# Patient Record
Sex: Male | Born: 1985 | Race: White | Hispanic: No | Marital: Married | Smoking: Former smoker
Health system: Southern US, Community
[De-identification: ages and names within clinical notes are randomized; demographics above are authoritative.]

## PROBLEM LIST (undated history)

## (undated) DIAGNOSIS — K760 Fatty (change of) liver, not elsewhere classified: Secondary | ICD-10-CM

## (undated) DIAGNOSIS — F419 Anxiety disorder, unspecified: Secondary | ICD-10-CM

## (undated) DIAGNOSIS — M5030 Other cervical disc degeneration, unspecified cervical region: Principal | ICD-10-CM

## (undated) DIAGNOSIS — D1803 Hemangioma of intra-abdominal structures: Secondary | ICD-10-CM

## (undated) DIAGNOSIS — T7840XA Allergy, unspecified, initial encounter: Secondary | ICD-10-CM

## (undated) DIAGNOSIS — E785 Hyperlipidemia, unspecified: Secondary | ICD-10-CM

## (undated) DIAGNOSIS — G4730 Sleep apnea, unspecified: Secondary | ICD-10-CM

## (undated) DIAGNOSIS — K219 Gastro-esophageal reflux disease without esophagitis: Secondary | ICD-10-CM

## (undated) DIAGNOSIS — A048 Other specified bacterial intestinal infections: Secondary | ICD-10-CM

## (undated) DIAGNOSIS — G4733 Obstructive sleep apnea (adult) (pediatric): Secondary | ICD-10-CM

## (undated) DIAGNOSIS — I10 Essential (primary) hypertension: Secondary | ICD-10-CM

## (undated) HISTORY — DX: Hemangioma of intra-abdominal structures: D18.03

## (undated) HISTORY — DX: Other specified bacterial intestinal infections: A04.8

## (undated) HISTORY — DX: Gastro-esophageal reflux disease without esophagitis: K21.9

## (undated) HISTORY — DX: Anxiety disorder, unspecified: F41.9

## (undated) HISTORY — DX: Other cervical disc degeneration, unspecified cervical region: M50.30

## (undated) HISTORY — PX: WISDOM TOOTH EXTRACTION: SHX21

## (undated) HISTORY — DX: Fatty (change of) liver, not elsewhere classified: K76.0

## (undated) HISTORY — DX: Obstructive sleep apnea (adult) (pediatric): G47.33

## (undated) HISTORY — DX: Essential (primary) hypertension: I10

## (undated) HISTORY — DX: Sleep apnea, unspecified: G47.30

## (undated) HISTORY — DX: Hyperlipidemia, unspecified: E78.5

## (undated) HISTORY — DX: Allergy, unspecified, initial encounter: T78.40XA

---

## 2004-05-14 ENCOUNTER — Emergency Department (HOSPITAL_COMMUNITY): Admission: EM | Admit: 2004-05-14 | Discharge: 2004-05-14 | Payer: Self-pay

## 2008-01-29 ENCOUNTER — Ambulatory Visit (HOSPITAL_COMMUNITY): Admission: RE | Admit: 2008-01-29 | Discharge: 2008-01-29 | Payer: Self-pay | Admitting: Otolaryngology

## 2010-12-04 ENCOUNTER — Encounter: Payer: Self-pay | Admitting: Otolaryngology

## 2011-04-18 ENCOUNTER — Encounter: Payer: Self-pay | Admitting: Gastroenterology

## 2011-05-30 ENCOUNTER — Ambulatory Visit (INDEPENDENT_AMBULATORY_CARE_PROVIDER_SITE_OTHER): Payer: Managed Care, Other (non HMO) | Admitting: Gastroenterology

## 2011-05-30 ENCOUNTER — Encounter: Payer: Self-pay | Admitting: Gastroenterology

## 2011-05-30 DIAGNOSIS — R1310 Dysphagia, unspecified: Secondary | ICD-10-CM

## 2011-05-30 DIAGNOSIS — K219 Gastro-esophageal reflux disease without esophagitis: Secondary | ICD-10-CM

## 2011-05-30 MED ORDER — DEXLANSOPRAZOLE 60 MG PO CPDR: 60.0000 mg | DELAYED_RELEASE_CAPSULE | Freq: Every day | ORAL | Status: DC

## 2011-05-30 NOTE — Patient Instructions (Signed)
Today you watched the gerd movie in office.  Take Dexilant 60mg  once a day 30 min before breakfast. Samples given and rx sent to your pharmacy.  Your procedure has been scheduled for 06/12/2011   Acid Reflux (GERD) Acid reflux is also called gastroesophageal reflux disease (GERD). Your stomach makes acid to help digest food. Acid reflux happens when acid from your stomach goes into the tube between your mouth and stomach (esophagus). Your stomach is protected from the acid, but this tube is not. When acid gets into the tube, it may cause a burning feeling in the chest (heartburn). Besides heartburn, other health problems can happen if the acid keeps going into the tube. Some causes of acid reflux include:  Being overweight.   Smoking.   Drinking alcohol.   Eating large meals.   Eating meals and then going to bed right away.   Eating certain foods.   Increased stomach acid production.  HOME CARE  Take all medicine as told by your doctor.   You may need to:   Lose weight.   Avoid alcohol.   Quit smoking.   Do not eat big meals. It is better to eat smaller meals throughout the day.   Do not eat a meal and then nap or go to bed.   Sleep with your head higher than your stomach.   Avoid foods that bother you.   You may need more tests, or you may need to see a special doctor.  GET HELP RIGHT AWAY IF:  You have chest pain that is different than before.   You have pain that goes to your arms, jaw, or between your shoulder blades.   You throw up (vomit) blood, dark brown liquid, or your throw up looks like coffee grounds.   You have trouble swallowing.   You have trouble breathing or cannot stop coughing.   You feel dizzy or pass out.   Your skin is cool, wet, and pale.   Your medicine is not helping.  MAKE SURE YOU:   Understand these instructions.   Will watch your condition.   Will get help right away if you are not doing well or get worse.  Document  Released: 04/17/2008 Document Re-Released: 01/24/2010 [employer] Patient Information 2011 [place], [place]., please follow the seperate instructions.

## 2011-05-30 NOTE — Progress Notes (Signed)
History of Present Illness:  This is a very nice 25 year old white male referred through the courtesy of Dr. Lynnea Ferrier for evaluation of several years of increasing acid reflux symptoms with burning substernal chest pain and regurgitation without dysphagia. He apparently has been overriding of PPIs with minimal response. He has not had previous endoscopy or barium studies. His father suffers from chronic acid reflux has had previous fundoplication. The patient does excessive bending and lifting at his job, and is concerned about his regurgitation problems. Recent internal medicine evaluation has shown normal CBC a metabolic profile.  He follows a regular diet and denies new food intolerances. He does not smoke and uses ethanol socially. He recently was treated for H. pylori infection without symptomatic improvement. No history of lower gastrointestinal or hepatobiliary or general medical problems.  I have reviewed this patient's present history, medical and surgical past history, allergies and medications.     ROS: The remainder of the 10 point ROS is negative.. he specifically denies Raynaud's phenomenon, use of NSAIDs or other anti-inflammatories. No history of previous hepatitis or pancreatitis. He denies current cardiovascular, pulmonary, neurological or genitourinary or rheumatic problems.     Physical Exam: General well developed well nourished patient in no acute distress, appearing his stated age Eyes PERRLA, no icterus, fundoscopic exam per opthamologist Skin no lesions noted Neck supple, no adenopathy, no thyroid enlargement, no tenderness Chest clear to percussion and auscultation Heart no significant murmurs, gallops or rubs noted Abdomen no hepatosplenomegaly masses or tenderness, BS normal.  Extremities no acute joint lesions, edema, phlebitis or evidence of cellulitis. Neurologic patient oriented x 3, cranial nerves intact, no focal neurologic deficits noted. Psychological  mental status normal and normal affect.  Assessment and plan: Chronic GERD and a young patient who is physically active. He has not used PPIs were regular basis as suggested. I have reviewed and acid reflux regime with him, have renewed his Dexilant 60 mg every morning, and have scheduled her for endoscopic exam. This patient may be a good candidate for fundoplication surgery because of these factors. He saw our patient education video on acid reflux in his management. I suspect he does have a prominent hiatal hernia-rule out Barrett's mucosa.  Encounter Diagnosis  Name Primary?  Marland Kitchen Dysphagia Yes

## 2011-06-12 ENCOUNTER — Ambulatory Visit (AMBULATORY_SURGERY_CENTER): Payer: Managed Care, Other (non HMO) | Admitting: Gastroenterology

## 2011-06-12 ENCOUNTER — Encounter: Payer: Self-pay | Admitting: Gastroenterology

## 2011-06-12 DIAGNOSIS — R1310 Dysphagia, unspecified: Secondary | ICD-10-CM | POA: Insufficient documentation

## 2011-06-12 DIAGNOSIS — K219 Gastro-esophageal reflux disease without esophagitis: Secondary | ICD-10-CM

## 2011-06-12 MED ORDER — SODIUM CHLORIDE 0.9 % IV SOLN: 500.0000 mL | INTRAVENOUS | Status: DC

## 2011-06-12 NOTE — Patient Instructions (Addendum)
Please follow discharge instructions given today. Continue medications daily,samples given dexilant 1 tablet daily 30 minutes before breakfast. Next few days call office [phone] to make office visit to see Dr.Patterson in  One Month. Call us with any questions or concerns.   Gastroesophageal Reflux Disease (GERD) Your caregiver has diagnosed your chest discomfort as caused by gastroesophageal reflux disease (GERD). GERD is caused by a reflux of acid from your stomach into the digestive tube between your mouth and stomach (esophagus). Acid in contact with the esophagus causes soreness (inflammation) resulting in heartburn or chest pain. It may cause small holes in the lining of the esophagus (ulcers). CAUSES  Increased body weight puts pressure on the stomach, making acid rise.   Smoking increases acid production.   Alcohol decreases pressure on the valve between the stomach and esophagus (lower esophageal sphincter), allowing acid from the stomach into the esophagus.   Late evening meals and a full stomach increase pressure and acid production.   Lower esophageal sphincter is malformed.   Sometimes, no reason is found.  HOME CARE INSTRUCTIONS  Change the factors that you can control. Weight, smoking, or alcohol changes may be difficult to change on your own. Your caregiver can provide guidance and medical therapy.   Raising the head of your bed may help you to sleep.   Over-the-counter medicines will decrease acid production. Your caregiver can also prescribe medicines for this. Only take over-the-counter or prescription medicines for pain, discomfort, or fever as directed by your caregiver.   1/2 to 1 teaspoon of an antacid taken every hour while awake, with meals, and at bedtime, can neutralize acid.   DO NOT take aspirin, ibuprofen, or other nonsteroidal anti-inflammatory drugs (NSAIDs).  SEEK IMMEDIATE MEDICAL CARE IF:  The pain changes in location (radiates into arms, neck, jaw,  teeth, or back), intensity, or duration.   You start feeling sick to your stomach (nauseous), start throwing up (vomiting), or sweating (diaphoresis).   You develop left arm or jaw pain.   You develop pain going into your back, shortness of breath, or you pass out.   There is vomiting of fluid that is green, yellow, or looks like coffee grounds or blood.  These symptoms could signal other problems, such as heart disease. MAKE SURE YOU:  Understand these instructions.   Will watch your condition.   Will get help right away if you are not doing well or get worse.  Document Released: 08/09/2005 Document Re-Released: 01/24/2010 [facility] Patient Information 2011 [place], [place].

## 2011-06-13 ENCOUNTER — Telehealth: Payer: Self-pay | Admitting: *Deleted

## 2011-06-13 ENCOUNTER — Telehealth: Payer: Self-pay

## 2011-06-13 NOTE — Telephone Encounter (Signed)
No ID on answering machine.  No message left. 

## 2011-06-13 NOTE — Telephone Encounter (Signed)
lmom for pt to call back. Per his discharge notes, scheduled pt to see Dr Jarold Motto on 07/04/11 at 0845am.

## 2011-06-15 ENCOUNTER — Encounter: Payer: Self-pay | Admitting: *Deleted

## 2011-06-15 NOTE — Telephone Encounter (Signed)
Pt did not call back; letter sent with appointment time.

## 2011-07-04 ENCOUNTER — Ambulatory Visit: Payer: Managed Care, Other (non HMO) | Admitting: Gastroenterology

## 2011-07-11 ENCOUNTER — Encounter: Payer: Self-pay | Admitting: Gastroenterology

## 2011-07-11 ENCOUNTER — Ambulatory Visit (INDEPENDENT_AMBULATORY_CARE_PROVIDER_SITE_OTHER): Payer: Managed Care, Other (non HMO) | Admitting: Gastroenterology

## 2011-07-11 VITALS — BP 100/74 | HR 80 | Ht 69.0 in | Wt 148.4 lb

## 2011-07-11 DIAGNOSIS — K219 Gastro-esophageal reflux disease without esophagitis: Secondary | ICD-10-CM | POA: Insufficient documentation

## 2011-07-11 NOTE — Patient Instructions (Signed)
Take your Dexilant for another couple months and then try to stop it.

## 2011-07-11 NOTE — Progress Notes (Signed)
History of Present Illness: This is a [age]-year-old Caucasian male with one year of GERD now relieved by daily Dexilant 60 mg every morning. Recent endoscopy was entirely unremarkable. He denies a current gastrointestinal or general medical problems.    Current Medications, Allergies, Past Medical History, Past Surgical History, Family History and Social History were reviewed in [employer] record.   Assessment and plan: I have reviewed her reflux regime with him, and actually is much better with his symptomatology since discontinuing carbonated beverages. I've asked him to take Dexilant 60 mg every morning for another 8 weeks, then try to discontinue this medication and to let us know if he has a relapse of his GERD. We will see him otherwise we'll when necessary basis as needed. Encounter Diagnosis  Name Primary?  . GERD (gastroesophageal reflux disease) Yes

## 2011-08-01 ENCOUNTER — Ambulatory Visit: Payer: Managed Care, Other (non HMO) | Admitting: Gastroenterology

## 2011-08-15 ENCOUNTER — Ambulatory Visit: Payer: Managed Care, Other (non HMO) | Admitting: Gastroenterology

## 2012-06-07 ENCOUNTER — Other Ambulatory Visit: Payer: Self-pay | Admitting: Gastroenterology

## 2013-02-15 ENCOUNTER — Encounter: Payer: Self-pay | Admitting: Family Medicine

## 2013-02-15 DIAGNOSIS — J309 Allergic rhinitis, unspecified: Secondary | ICD-10-CM | POA: Insufficient documentation

## 2013-02-20 ENCOUNTER — Encounter: Payer: Self-pay | Admitting: Family Medicine

## 2013-03-20 ENCOUNTER — Encounter: Payer: Self-pay | Admitting: Family Medicine

## 2013-03-20 ENCOUNTER — Ambulatory Visit (INDEPENDENT_AMBULATORY_CARE_PROVIDER_SITE_OTHER): Payer: Managed Care, Other (non HMO) | Admitting: Family Medicine

## 2013-03-20 VITALS — BP 100/70 | HR 58 | Temp 98.2°F | Resp 14 | Ht 69.0 in | Wt 160.0 lb

## 2013-03-20 DIAGNOSIS — B078 Other viral warts: Secondary | ICD-10-CM

## 2013-03-20 DIAGNOSIS — A630 Anogenital (venereal) warts: Secondary | ICD-10-CM | POA: Insufficient documentation

## 2013-03-20 DIAGNOSIS — Z0000 Encounter for general adult medical examination without abnormal findings: Secondary | ICD-10-CM

## 2013-03-20 DIAGNOSIS — B079 Viral wart, unspecified: Secondary | ICD-10-CM

## 2013-03-20 NOTE — Progress Notes (Signed)
Subjective:    Patient ID: Kent Hernandez, male    DOB: Jul 04, 1986, 27 y.o.   MRN: [id number]  HPI Patient is here for complete physical exam. He's continued to have frequent heartburn. This occurs possibly 2-3 days a week. Haldol and medications seemed to help his He feels is too expensive. He denies any melena or hematochezia. He is also complaining of genital warts. They're difficult to determine if her general large versus small skin tags because they're very small 1-2 mm in size. We have previously treated similar small papules with cryotherapy. He is requesting that we do that today.  Past Medical History  Diagnosis Date  . H. pylori infection   . Esophageal reflux   . Allergic rhinitis   . Genital warts    Current Outpatient Prescriptions on File Prior to Visit  Medication Sig Dispense Refill  . cetirizine (ZYRTEC) 10 MG tablet Take 10 mg by mouth 2 (two) times daily.         No current facility-administered medications on file prior to visit.   No Known Allergies History   Social History  . Marital Status: Single    Spouse Name: N/A    Number of Children: N/A  . Years of Education: N/A   Occupational History  . Store Event organiser   Social History Main Topics  . Smoking status: Former Smoker    Types: Cigarettes  . Smokeless tobacco: Former Neurosurgeon    Quit date: 02/12/2011     Comment: Light use  . Alcohol Use: Yes  . Drug Use: No  . Sexually Active: Not on file   Other Topics Concern  . Not on file   Social History Narrative  . No narrative on file   Family History  Problem Relation Age of Onset  . Diabetes Maternal Uncle   . Diabetes Paternal Grandfather   . Hypertension Father   . Kidney disease Father   . Cancer Maternal Grandmother       Review of Systems  All other systems reviewed and are negative.       Objective:   Physical Exam  Constitutional: He is oriented to person, place, and time. He appears well-developed and  well-nourished.  HENT:  Head: Normocephalic and atraumatic.  Right Ear: External ear normal.  Left Ear: External ear normal.  Nose: Nose normal.  Mouth/Throat: Oropharynx is clear and moist. No oropharyngeal exudate.  Eyes: Conjunctivae and EOM are normal. Pupils are equal, round, and reactive to light. Right eye exhibits no discharge. Left eye exhibits no discharge. No scleral icterus.  Neck: Normal range of motion. Neck supple. No JVD present. No thyromegaly present.  Cardiovascular: Normal rate, regular rhythm and intact distal pulses.  Exam reveals no friction rub.   No murmur heard. Pulmonary/Chest: Effort normal and breath sounds normal. No respiratory distress. He has no wheezes. He has no rales. He exhibits no tenderness.  Abdominal: Soft. Bowel sounds are normal. He exhibits no distension and no mass. There is no tenderness. There is no rebound and no guarding.  Genitourinary: Penis normal. No penile tenderness.  Musculoskeletal: Normal range of motion. He exhibits no edema and no tenderness.  Lymphadenopathy:    He has no cervical adenopathy.  Neurological: He is alert and oriented to person, place, and time. He has normal reflexes. He displays normal reflexes. No cranial nerve deficit. He exhibits normal muscle tone. Coordination normal.  Skin: Skin is warm and dry. Rash noted. No erythema. No pallor.  Psychiatric:  He has a normal mood and affect. His behavior is normal. Judgment and thought content normal.   patient has for, 1-2 mm flesh-colored papules around his penis. They appear to be small verruca although I cannot exclude skin tags.        Assessment & Plan:  1. Routine general medical examination at a health care facility Physical is normal. I recommended a tetanus shot. The patient declined. He should return fasting for some baseline screening labs. 2 recommend he start taking Zantac 150 mg by mouth daily for heartburn. - Basic Metabolic Panel; Future - CBC with  Differential; Future - Hepatic Function Panel; Future - Lipid Panel; Future  2. Verrucae vulgaris There were 4 lesions which I believe her genital warts versus small skin tags although is difficult to delineate today. Liquid nitrogen cryotherapy was applied to each lesion for a total of 20 seconds. Wound care was discussed.

## 2013-05-20 ENCOUNTER — Telehealth: Payer: Self-pay | Admitting: Family Medicine

## 2013-05-20 ENCOUNTER — Other Ambulatory Visit: Payer: Self-pay | Admitting: Family Medicine

## 2013-05-20 MED ORDER — DEXLANSOPRAZOLE 60 MG PO CPDR: 60.0000 mg | DELAYED_RELEASE_CAPSULE | Freq: Every day | ORAL | Status: DC

## 2013-05-20 NOTE — Telephone Encounter (Signed)
Pt aware.

## 2013-05-20 NOTE — Telephone Encounter (Signed)
Ordered in epic

## 2013-06-16 ENCOUNTER — Ambulatory Visit (INDEPENDENT_AMBULATORY_CARE_PROVIDER_SITE_OTHER): Payer: Managed Care, Other (non HMO) | Admitting: Physician Assistant

## 2013-06-16 ENCOUNTER — Encounter: Payer: Self-pay | Admitting: Physician Assistant

## 2013-06-16 VITALS — BP 124/90 | HR 68 | Temp 98.3°F | Resp 18 | Ht 68.75 in | Wt 165.0 lb

## 2013-06-16 DIAGNOSIS — H9209 Otalgia, unspecified ear: Secondary | ICD-10-CM

## 2013-06-16 DIAGNOSIS — M542 Cervicalgia: Secondary | ICD-10-CM

## 2013-06-16 DIAGNOSIS — H9202 Otalgia, left ear: Secondary | ICD-10-CM

## 2013-06-16 DIAGNOSIS — Z0000 Encounter for general adult medical examination without abnormal findings: Secondary | ICD-10-CM

## 2013-06-16 LAB — CBC WITH DIFFERENTIAL/PLATELET
Eosinophils Absolute: 0.2 10*3/uL (ref 0.0–0.7)
Eosinophils Relative: 4 % (ref 0–5)
Lymphs Abs: 1.8 10*3/uL (ref 0.7–4.0)
MCH: 30.5 pg (ref 26.0–34.0)
MCV: 87.3 fL (ref 78.0–100.0)
Monocytes Relative: 9 % (ref 3–12)
Platelets: 297 10*3/uL (ref 150–400)
RBC: 5.35 MIL/uL (ref 4.22–5.81)

## 2013-06-16 LAB — HEPATIC FUNCTION PANEL
ALT: 37 U/L (ref 0–53)
AST: 27 U/L (ref 0–37)
Albumin: 4.5 g/dL (ref 3.5–5.2)
Alkaline Phosphatase: 77 U/L (ref 39–117)
Total Protein: 6.8 g/dL (ref 6.0–8.3)

## 2013-06-16 LAB — BASIC METABOLIC PANEL
BUN: 11 mg/dL (ref 6–23)
Chloride: 103 mEq/L (ref 96–112)
Potassium: 4.5 mEq/L (ref 3.5–5.3)

## 2013-06-16 MED ORDER — MELOXICAM 7.5 MG PO TABS: 7.5000 mg | ORAL_TABLET | Freq: Every day | ORAL | Status: DC

## 2013-06-16 MED ORDER — METAXALONE 800 MG PO TABS: 800.0000 mg | ORAL_TABLET | Freq: Three times a day (TID) | ORAL | Status: DC

## 2013-06-16 NOTE — Progress Notes (Signed)
Patient ID: Kent Hernandez MRN: [id number], DOB: 12/02/1985, 27 y.o. Date of Encounter: 06/16/2013, 1:30 PM    Chief Complaint:  Chief Complaint  Patient presents with  . neck pain since yesterday,  water in left ear    had CPE in May  never returned for fasting labs want sto do today     HPI: 27 y.o. year old white male here with c/o:  1- Neck Pain: He was at beach- yesterday was sitting in a chair and turned his head-suddenly developed pain that shot down his right neck. Since, can not turn head, has pain there. He had done no overhead activity, lifting, etc. No trauma, injury. Has no idea what brouth this about-thinks he just turned his head jus tth e"right way" to cause a "catch." He has no h/o pain in neck. 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   2- "Got water in his left ear and ringing in the ear since."   Home Meds: See attached medication section for any medications that were entered at today's visit. The computer does not put those onto this list.The following list is a list of meds entered prior to today's visit.   Current Outpatient Prescriptions on File Prior to Visit  Medication Sig Dispense Refill  . cetirizine (ZYRTEC) 10 MG tablet Take 10 mg by mouth 2 (two) times daily.        . ciclesonide (OMNARIS) 50 MCG/ACT nasal spray Place 2 sprays into both nostrils daily.      Marland Kitchen dexlansoprazole (DEXILANT) 60 MG capsule Take 1 capsule (60 mg total) by mouth daily.  30 capsule  2   No current facility-administered medications on file prior to visit.    Allergies: No Known Allergies    Review of Systems: See HPI for pertinent ROS. All other ROS negative.    Physical Exam: Blood pressure 124/90, pulse 68, temperature 98.3 F (36.8 C), temperature source Oral, resp. rate 18, height 5' 8.75" (1.746 m), weight 165 lb (74.844 kg)., Body mass index is 24.55 kg/(m^2). General: WNWD WM. Appears in no acute distress. HEENT: Normocephalic, atraumatic, eyes without  discharge, sclera non-icteric, nares are without discharge. Left Ear Canal obstructed with cerumen. Right Ear canal, TM normal.  Oral cavity moist, posterior pharynx without exudate, erythema, peritonsillar abscess, or post nasal drip.  Neck: Supple. No thyromegaly. No lymphadenopathy. He can only rotate to Right 15 degrees. He can only tilt head to Left and Right 15 degrees. Cna rotate to Left 60 degrees.  Right Trapezius extremely painful and tight with palpation.  Lungs: Clear bilaterally to auscultation without wheezes, rales, or rhonchi. Breathing is unlabored. Heart: Regular rhythm. No murmurs, rubs, or gallops. Msk:  Strength and tone normal for age. Extremities/Skin: Warm and dry.  Neuro: Alert and oriented X 3. Moves all extremities spontaneously. Gait is normal. CNII-XII grossly in tact. Psych:  Responds to questions appropriately with a normal affect.     ASSESSMENT AND PLAN:  27 y.o. year old male with  1. Neck pain He reports he is OOW today. Plans to return to work tomorrow.  Informed to take Mobic with food. Informed Skelaxin may cause drowsiness. If so, only take HS. Apply heat. Stretch gently. Shoulder ROM also . - metaxalone (SKELAXIN) 800 MG tablet; Take 1 tablet (800 mg total) by mouth 3 (three) times daily.  Dispense: 30 tablet; Refill: 1 - meloxicam (MOBIC) 7.5 MG tablet; Take 1 tablet (7.5 mg total) by mouth daily.  Dispense: 30 tablet;  Refill: 0  2. Left ear pain-Sec to cerumen impaction.  Irrigate now.   3. Routine general medical examination at a health care facility Pt had CPE 5/14. He never returned fasting until now-wants to do fasting labs-he had this drawn before I even enetered room today. CPE NOT done today. (Level 3 OV today) - Basic Metabolic Panel - CBC with Differential - Hepatic Function Panel - Lipid Panel   Signed, [address] [place], [place], [facility] 06/16/2013 1:30 PM

## 2013-06-18 ENCOUNTER — Encounter: Payer: Self-pay | Admitting: Family Medicine

## 2013-07-30 ENCOUNTER — Ambulatory Visit: Payer: Self-pay | Admitting: Family Medicine

## 2013-08-04 ENCOUNTER — Encounter: Payer: Self-pay | Admitting: Family Medicine

## 2013-08-04 ENCOUNTER — Ambulatory Visit (INDEPENDENT_AMBULATORY_CARE_PROVIDER_SITE_OTHER): Payer: Managed Care, Other (non HMO) | Admitting: Family Medicine

## 2013-08-04 VITALS — BP 104/70 | HR 82 | Temp 97.7°F | Resp 18 | Wt 163.0 lb

## 2013-08-04 DIAGNOSIS — L989 Disorder of the skin and subcutaneous tissue, unspecified: Secondary | ICD-10-CM

## 2013-08-04 MED ORDER — DEXLANSOPRAZOLE 60 MG PO CPDR: 60.0000 mg | DELAYED_RELEASE_CAPSULE | Freq: Every day | ORAL | Status: DC

## 2013-08-04 NOTE — Patient Instructions (Signed)
Use topical antibiotic ointment twice a day  Call if infection worsens  Or any other concerns

## 2013-08-04 NOTE — Assessment & Plan Note (Signed)
Small irritated papule in his umbilicus. I see no evidence of cellulitis or abscess. This does not characteristic of a polyp in his only been present a couple weeks. Looks like there may of been some irritation to the skin initially before the papule. We will apply Triple Antibiotic ointment and see how this does. If he has any worsening symptoms or signs of cellulitis or abscess will start him on oral antibiotics. If the lesion does not improve we'll consider resection

## 2013-08-04 NOTE — Progress Notes (Signed)
  Subjective:    Patient ID: Kent Hernandez, male    DOB: 27-Jan-1986, 27 y.o.   MRN: [id number]  HPI  Pt here with small bump inside naval for 2 weeks. Denies any drainage, but tender to touch. Denies abdominal pain, N/V, diarrhea or fever. No previous lesions.   Review of Systems - per above  GEN- denies fatigue, fever, weight loss,weakness, recent illnesse ABD- denies N/V, change in stools, abd pain        Objective:   Physical Exam GEN-NAD,alert and oriented x 3 ABD- soft,NT,ND, no umbilical hernia Skin- umbilicus small erythematous papule mild TTP, no discharge, no fluctuance, no cellulitic changes       Assessment & Plan:

## 2013-11-13 DIAGNOSIS — M5030 Other cervical disc degeneration, unspecified cervical region: Secondary | ICD-10-CM

## 2013-11-13 HISTORY — DX: Other cervical disc degeneration, unspecified cervical region: M50.30

## 2014-04-01 ENCOUNTER — Other Ambulatory Visit: Payer: Self-pay | Admitting: Family Medicine

## 2014-04-01 NOTE — Telephone Encounter (Signed)
Refill appropriate and filled per protocol. 

## 2014-05-05 ENCOUNTER — Ambulatory Visit: Payer: Managed Care, Other (non HMO) | Admitting: Family Medicine

## 2014-05-14 ENCOUNTER — Ambulatory Visit: Payer: Self-pay | Admitting: Family Medicine

## 2014-05-21 ENCOUNTER — Ambulatory Visit (INDEPENDENT_AMBULATORY_CARE_PROVIDER_SITE_OTHER): Payer: Managed Care, Other (non HMO) | Admitting: Family Medicine

## 2014-05-21 ENCOUNTER — Encounter: Payer: Self-pay | Admitting: Family Medicine

## 2014-05-21 VITALS — BP 110/90 | HR 76 | Temp 97.7°F | Resp 18 | Ht 69.0 in | Wt 164.0 lb

## 2014-05-21 DIAGNOSIS — G5603 Carpal tunnel syndrome, bilateral upper limbs: Secondary | ICD-10-CM

## 2014-05-21 DIAGNOSIS — G5600 Carpal tunnel syndrome, unspecified upper limb: Secondary | ICD-10-CM

## 2014-05-21 DIAGNOSIS — M542 Cervicalgia: Secondary | ICD-10-CM

## 2014-05-21 NOTE — Progress Notes (Signed)
   Subjective:    Patient ID: Kent Hernandez, male    DOB: June 26, 1986, 28 y.o.   MRN: [id number]  HPI Patient presents with 3-4 months of worsening numbness in both hands  it is worse depending upon the position his hands are in. Symptoms tend to happen at night, while driving a car, talking on the telephone, or typing on his computer.  However the symptoms can occur unprovoked.  He is also reporting weakness in his hands. He complains of pain in his neck. Pain is worse whenever he rotates his head to left. He denies any injury to the neck. Past Medical History  Diagnosis Date  . H. pylori infection   . Esophageal reflux   . Allergic rhinitis   . Genital warts    Current Outpatient Prescriptions on File Prior to Visit  Medication Sig Dispense Refill  . cetirizine (ZYRTEC) 10 MG tablet Take 10 mg by mouth 2 (two) times daily.        . ciclesonide (OMNARIS) 50 MCG/ACT nasal spray Place 2 sprays into both nostrils daily.      Marland Kitchen DEXILANT 60 MG capsule TAKE ONE (1) CAPSULE EACH DAY  30 capsule  3   No current facility-administered medications on file prior to visit.   No Known Allergies History   Social History  . Marital Status: Single    Spouse Name: N/A    Number of Children: N/A  . Years of Education: N/A   Occupational History  . Store Production assistant, radio   Social History Main Topics  . Smoking status: Former Smoker    Types: Cigarettes  . Smokeless tobacco: Former Systems developer    Quit date: 02/12/2011     Comment: Light use  . Alcohol Use: Yes  . Drug Use: No  . Sexual Activity: Not on file   Other Topics Concern  . Not on file   Social History Narrative  . No narrative on file       Review of Systems  All other systems reviewed and are negative.      Objective:   Physical Exam  Vitals reviewed. Constitutional: He is oriented to person, place, and time.  Cardiovascular: Normal rate, regular rhythm and normal heart sounds.   No murmur heard. Pulmonary/Chest:  Effort normal and breath sounds normal. No respiratory distress. He has no wheezes. He has no rales.  Abdominal: Soft. Bowel sounds are normal.  Neurological: He is alert and oriented to person, place, and time. He has normal reflexes. He displays normal reflexes. No cranial nerve deficit. He exhibits normal muscle tone. Coordination normal.   patient has a positive Tinel sign and positive Phalen sign at both wrists although left is greater than right.        Assessment & Plan:  1. Neck pain Began by obtaining cervical spine x-ray. Await results of the x-ray before proceeding further the - DG Cervical Spine Complete; Future  2. Bilateral carpal tunnel syndrome Symptoms are consistent with carpal tunnel syndrome. Ultane or conduction test and depending upon the severity may recommend a consultation to orthopedic surgery. - Nerve conduction test; Future

## 2014-06-18 ENCOUNTER — Ambulatory Visit
Admission: RE | Admit: 2014-06-18 | Discharge: 2014-06-18 | Disposition: A | Discharge status: Home / Self Care | Payer: Managed Care, Other (non HMO) | Source: Ambulatory Visit | Attending: Family Medicine | Admitting: Family Medicine

## 2014-06-18 DIAGNOSIS — M542 Cervicalgia: Secondary | ICD-10-CM

## 2014-06-19 ENCOUNTER — Other Ambulatory Visit: Payer: Self-pay | Admitting: Family Medicine

## 2014-06-19 DIAGNOSIS — G542 Cervical root disorders, not elsewhere classified: Secondary | ICD-10-CM

## 2014-06-25 ENCOUNTER — Ambulatory Visit (HOSPITAL_COMMUNITY): Payer: Managed Care, Other (non HMO)

## 2014-07-01 ENCOUNTER — Ambulatory Visit (HOSPITAL_COMMUNITY)
Admission: RE | Admit: 2014-07-01 | Discharge: 2014-07-01 | Disposition: A | Discharge status: Home / Self Care | Payer: Managed Care, Other (non HMO) | Source: Ambulatory Visit | Attending: Family Medicine | Admitting: Family Medicine

## 2014-07-01 ENCOUNTER — Ambulatory Visit (HOSPITAL_COMMUNITY): Payer: Managed Care, Other (non HMO)

## 2014-07-01 DIAGNOSIS — M5020 Other cervical disc displacement, unspecified cervical region: Secondary | ICD-10-CM | POA: Diagnosis not present

## 2014-07-01 DIAGNOSIS — M542 Cervicalgia: Secondary | ICD-10-CM | POA: Insufficient documentation

## 2014-07-01 DIAGNOSIS — M5030 Other cervical disc degeneration, unspecified cervical region: Secondary | ICD-10-CM | POA: Insufficient documentation

## 2014-07-01 DIAGNOSIS — G542 Cervical root disorders, not elsewhere classified: Secondary | ICD-10-CM

## 2014-07-06 ENCOUNTER — Telehealth: Payer: Self-pay | Admitting: Family Medicine

## 2014-07-06 MED ORDER — TRAMADOL HCL 50 MG PO TABS: 50.0000 mg | ORAL_TABLET | Freq: Three times a day (TID) | ORAL | Status: DC | PRN

## 2014-07-06 NOTE — Telephone Encounter (Signed)
Med called to pharm and pt aware 

## 2014-07-06 NOTE — Telephone Encounter (Signed)
Patient had mri last week and would like appt with dr Rita Ohara if possible the neurosurgeon  [phone]

## 2014-07-06 NOTE — Telephone Encounter (Signed)
Okay with tramadol 50 mg poq6 hrs prn pain. 30.

## 2014-07-06 NOTE — Telephone Encounter (Signed)
[phone] Patient is calling to see if he can get pain medication for his neck, he had mri last week

## 2014-07-07 ENCOUNTER — Other Ambulatory Visit: Payer: Self-pay | Admitting: Family Medicine

## 2014-07-07 DIAGNOSIS — IMO0002 Reserved for concepts with insufficient information to code with codable children: Secondary | ICD-10-CM

## 2014-07-07 DIAGNOSIS — M5030 Other cervical disc degeneration, unspecified cervical region: Secondary | ICD-10-CM

## 2014-07-08 NOTE — Telephone Encounter (Signed)
Referral initiated to Dr. Sherwood Gambler

## 2014-08-10 ENCOUNTER — Telehealth: Payer: Self-pay | Admitting: Family Medicine

## 2014-08-10 MED ORDER — DEXLANSOPRAZOLE 60 MG PO CPDR: DELAYED_RELEASE_CAPSULE | ORAL | Status: DC

## 2014-08-10 NOTE — Telephone Encounter (Signed)
[phone]  Pt is needing a refill on DEXILANT 60 MG capsule

## 2014-08-10 NOTE — Telephone Encounter (Signed)
Med sent to pharm 

## 2015-08-27 ENCOUNTER — Telehealth: Payer: Self-pay | Admitting: Family Medicine

## 2015-08-27 NOTE — Telephone Encounter (Signed)
Pt is calling for a refill of his heart burn medication  [facility] [place] [phone]

## 2015-08-30 MED ORDER — DEXLANSOPRAZOLE 60 MG PO CPDR: DELAYED_RELEASE_CAPSULE | ORAL | Status: DC

## 2015-08-30 NOTE — Telephone Encounter (Signed)
Medication called/sent to requested pharmacy  

## 2015-09-21 ENCOUNTER — Other Ambulatory Visit: Payer: Managed Care, Other (non HMO)

## 2015-09-23 ENCOUNTER — Encounter: Payer: Managed Care, Other (non HMO) | Admitting: Family Medicine

## 2015-12-13 ENCOUNTER — Encounter: Payer: Self-pay | Admitting: Physician Assistant

## 2015-12-13 ENCOUNTER — Other Ambulatory Visit: Payer: Self-pay | Admitting: Physician Assistant

## 2015-12-13 ENCOUNTER — Ambulatory Visit (INDEPENDENT_AMBULATORY_CARE_PROVIDER_SITE_OTHER): Payer: Managed Care, Other (non HMO) | Admitting: Physician Assistant

## 2015-12-13 ENCOUNTER — Other Ambulatory Visit: Payer: Self-pay | Admitting: Family Medicine

## 2015-12-13 VITALS — BP 104/76 | HR 96 | Temp 98.8°F | Resp 18 | Wt 168.0 lb

## 2015-12-13 DIAGNOSIS — B9789 Other viral agents as the cause of diseases classified elsewhere: Secondary | ICD-10-CM

## 2015-12-13 DIAGNOSIS — R509 Fever, unspecified: Secondary | ICD-10-CM

## 2015-12-13 DIAGNOSIS — B349 Viral infection, unspecified: Secondary | ICD-10-CM | POA: Diagnosis not present

## 2015-12-13 DIAGNOSIS — J988 Other specified respiratory disorders: Secondary | ICD-10-CM

## 2015-12-13 LAB — INFLUENZA A AND B AG, IMMUNOASSAY
Influenza A Antigen: NOT DETECTED
Influenza B Antigen: NOT DETECTED

## 2015-12-13 MED ORDER — CICLESONIDE 50 MCG/ACT NA SUSP: 2.0000 | Freq: Every day | NASAL | Status: DC

## 2015-12-13 MED ORDER — DEXLANSOPRAZOLE 60 MG PO CPDR: DELAYED_RELEASE_CAPSULE | ORAL | Status: DC

## 2015-12-13 NOTE — Progress Notes (Signed)
Patient ID: Kent Hernandez MRN: [id number], DOB: July 12, 1986, 30 y.o. Date of Encounter: @DATE @  Chief Complaint:  Chief Complaint  Patient presents with  . sick x 1 day    fever, chills, harsh cough, congestion    HPI: 30 y.o. year old white male  presents with above.   Says generally he would not have come in this soon, but he has a 21 month old baby at home and wanted to make sure that he does not have the flu or something that could be treated and prevent from spreading to the baby. Has a baby boy and this is his first baby. Says that he just started getting sick yesterday. Yesterday developed a cough. Last night felt chills. He has been checking his temperature and gets between 97.2 - 99.2. Says he has had no drainage from the nose. Has just had cough. No significant sore throat.  Past Medical History  Diagnosis Date  . H. pylori infection   . Esophageal reflux   . Allergic rhinitis   . Genital warts      Home Meds: Outpatient Prescriptions Prior to Visit  Medication Sig Dispense Refill  . cetirizine (ZYRTEC) 10 MG tablet Take 10 mg by mouth 2 (two) times daily.      . ciclesonide (OMNARIS) 50 MCG/ACT nasal spray Place 2 sprays into both nostrils daily.    Marland Kitchen dexlansoprazole (DEXILANT) 60 MG capsule TAKE ONE (1) CAPSULE EACH DAY 30 capsule 11  . traMADol (ULTRAM) 50 MG tablet Take 1 tablet (50 mg total) by mouth every 8 (eight) hours as needed. 30 tablet 0   No facility-administered medications prior to visit.    Allergies: No Known Allergies  Social History   Social History  . Marital Status: Married    Spouse Name: N/A  . Number of Children: N/A  . Years of Education: N/A   Occupational History  . Store Production assistant, radio   Social History Main Topics  . Smoking status: Former Smoker    Types: Cigarettes  . Smokeless tobacco: Former Systems developer    Quit date: 02/12/2011     Comment: Light use  . Alcohol Use: Yes  . Drug Use: No  . Sexual Activity: Not on  file   Other Topics Concern  . Not on file   Social History Narrative    Family History  Problem Relation Age of Onset  . Diabetes Maternal Uncle   . Diabetes Paternal Grandfather   . Hypertension Father   . Kidney disease Father   . Cancer Maternal Grandmother      Review of Systems:  See HPI for pertinent ROS. All other ROS negative.    Physical Exam: Blood pressure 104/76, pulse 96, temperature 98.8 F (37.1 C), temperature source Oral, resp. rate 18, weight 168 lb (76.204 kg)., Body mass index is 24.8 kg/(m^2). General: WNWD WM. Appears in no acute distress. Head: Normocephalic, atraumatic, eyes without discharge, sclera non-icteric, nares are without discharge. Bilateral auditory canals clear, TM's are without perforation, pearly grey and translucent with reflective cone of light bilaterally. Oral cavity moist, posterior pharynx without exudate, erythema, peritonsillar abscess. No tenderness with percussion to frontal or maxillary sinuses bilaterally.  Neck: Supple. No thyromegaly. No lymphadenopathy. Lungs: Clear bilaterally to auscultation without wheezes, rales, or rhonchi. Breathing is unlabored. Heart: RRR with S1 S2. No murmurs, rubs, or gallops. Musculoskeletal:  Strength and tone normal for age. Extremities/Skin: Warm and dry. Neuro: Alert and oriented X 3. Moves all extremities  spontaneously. Gait is normal. CNII-XII grossly in tact. Psych:  Responds to questions appropriately with a normal affect.   Influenza test negative.   ASSESSMENT AND PLAN:  30 y.o. year old male with  1. Viral respiratory infection Reviewed that influenza test negative. Reviewed that this is likely a viral infection. Recommend use over-the-counter cough medications as needed for symptom relief. Follow up if fever increases or symptoms worsen significantly or persist greater than a week.  2. Chills with fever - Influenza a and b   Signed, [address] [place], [place], [facility] 12/13/2015 4:34  PM

## 2015-12-15 ENCOUNTER — Ambulatory Visit (INDEPENDENT_AMBULATORY_CARE_PROVIDER_SITE_OTHER): Payer: Managed Care, Other (non HMO) | Admitting: Physician Assistant

## 2015-12-15 ENCOUNTER — Encounter: Payer: Self-pay | Admitting: Physician Assistant

## 2015-12-15 VITALS — BP 106/60 | HR 108 | Temp 98.2°F | Resp 18 | Wt 166.0 lb

## 2015-12-15 DIAGNOSIS — J019 Acute sinusitis, unspecified: Secondary | ICD-10-CM | POA: Diagnosis not present

## 2015-12-15 MED ORDER — PREDNISONE 20 MG PO TABS: ORAL_TABLET | ORAL | Status: DC

## 2015-12-15 MED ORDER — AZITHROMYCIN 250 MG PO TABS: ORAL_TABLET | ORAL | Status: DC

## 2015-12-15 NOTE — Progress Notes (Signed)
Patient ID: Kent Hernandez MRN: [id number], DOB: 03-19-1986, 30 y.o. Date of Encounter: @DATE @  Chief Complaint:  Chief Complaint  Patient presents with  . sick x 3 days    fever, sinus infection    HPI: 30 y.o. year old white male  presents with above.   He just had office visit with me 12/13/15. Following is copied from that visit: Says generally he would not have come in this soon, but he has a 35 month old baby at home and wanted to make sure that he does not have the flu or something that could be treated and prevent from spreading to the baby. Has a baby boy and this is his first baby. Says that he just started getting sick yesterday. Yesterday developed a cough. Last night felt chills. He has been checking his temperature and gets between 97.2 - 99.2. Says he has had no drainage from the nose. Has just had cough. No significant sore throat. At that OV: Reviewed that influenza test negative. Reviewed that this is likely a viral infection. Recommend use over-the-counter cough medications as needed for symptom relief. Follow up if fever increases or symptoms worsen significantly or persist greater than a week.  TODAY: Today he states his symptoms have worsened significantly. Says is now having symptoms that he usually has with sinus infections. Head feels very stopped up with a lot of pressure. Is not getting much of anything out of his nose but says that is draining down his throat instead. His temperature has gone up to 101.1 at home. Says he does not feel like he has a lot of chest congestion but he does cough during the visit and it sounds very congested. Says that he is having no sore throat or earache.    Past Medical History  Diagnosis Date  . H. pylori infection   . Esophageal reflux   . Allergic rhinitis   . Genital warts      Home Meds: Outpatient Prescriptions Prior to Visit  Medication Sig Dispense Refill  . cetirizine (ZYRTEC) 10 MG tablet Take 10 mg by mouth 2  (two) times daily.      . ciclesonide (OMNARIS) 50 MCG/ACT nasal spray Place 2 sprays into both nostrils daily. 12.5 g 5  . dexlansoprazole (DEXILANT) 60 MG capsule TAKE ONE (1) CAPSULE EACH DAY 90 capsule 1   No facility-administered medications prior to visit.    Allergies: No Known Allergies  Social History   Social History  . Marital Status: Married    Spouse Name: N/A  . Number of Children: N/A  . Years of Education: N/A   Occupational History  . Store Production assistant, radio   Social History Main Topics  . Smoking status: Former Smoker    Types: Cigarettes  . Smokeless tobacco: Former Systems developer    Quit date: 02/12/2011     Comment: Light use  . Alcohol Use: Yes  . Drug Use: No  . Sexual Activity: Not on file   Other Topics Concern  . Not on file   Social History Narrative    Family History  Problem Relation Age of Onset  . Diabetes Maternal Uncle   . Diabetes Paternal Grandfather   . Hypertension Father   . Kidney disease Father   . Cancer Maternal Grandmother      Review of Systems:  See HPI for pertinent ROS. All other ROS negative.    Physical Exam: Blood pressure 106/60, pulse 108, temperature 98.2 F (36.8  C), temperature source Oral, resp. rate 18, weight 166 lb (75.297 kg)., Body mass index is 24.5 kg/(m^2). General: WNWD WM. Appears in no acute distress. Head: Normocephalic, atraumatic, eyes without discharge, sclera non-icteric, nares are without discharge. Bilateral auditory canals clear, TM's are without perforation, pearly grey and translucent with reflective cone of light bilaterally. Oral cavity moist, posterior pharynx without exudate, erythema, peritonsillar abscess. Minimal tenderness with percussion to frontal or maxillary sinuses bilaterally but says it feels very tight.  Neck: Supple. No thyromegaly. No lymphadenopathy. Lungs: Clear bilaterally to auscultation without wheezes, rales, or rhonchi. Breathing is unlabored. Heart: RRR with S1  S2. No murmurs, rubs, or gallops. Musculoskeletal:  Strength and tone normal for age. Extremities/Skin: Warm and dry. Neuro: Alert and oriented X 3. Moves all extremities spontaneously. Gait is normal. CNII-XII grossly in tact. Psych:  Responds to questions appropriately with a normal affect.     ASSESSMENT AND PLAN:  30 y.o. year old male with   1. Acute sinusitis, recurrence not specified, unspecified location He says that he has been given "cortisone shots in the past " for these, symptoms and is agreeable to use prednisone as this is given him relief in the past. He is to start the prednisone immediately take as directed. He is to also start the antibiotic today and take as directed and complete all of it. Follow-up if fever increases or is not resolving over the next 48 hours or symptoms are not improving in the next 72 hours or do not resolve upon completion of medications. - azithromycin (ZITHROMAX) 250 MG tablet; Day 1: Take 2 daily. Days 2-5: Take 1 daily.  Dispense: 6 tablet; Refill: 0 - predniSONE (DELTASONE) 20 MG tablet; Take 3 daily for 2 days, then 2 daily for 2 days, then 1 daily for 2 days.  Dispense: 12 tablet; Refill: 0   Signed, [address] [place], [place], [facility] 12/15/2015 11:55 AM

## 2016-02-11 ENCOUNTER — Ambulatory Visit (INDEPENDENT_AMBULATORY_CARE_PROVIDER_SITE_OTHER): Payer: Managed Care, Other (non HMO) | Admitting: Family Medicine

## 2016-02-11 ENCOUNTER — Encounter: Payer: Self-pay | Admitting: Family Medicine

## 2016-02-11 VITALS — BP 102/80 | HR 90 | Temp 98.2°F | Resp 20 | Wt 170.0 lb

## 2016-02-11 DIAGNOSIS — J029 Acute pharyngitis, unspecified: Secondary | ICD-10-CM | POA: Diagnosis not present

## 2016-02-11 LAB — STREP GROUP A AG, W/REFLEX TO CULT: STREGTOCOCCUS GROUP A AG SCREEN: NOT DETECTED

## 2016-02-11 NOTE — Progress Notes (Signed)
   Subjective:    Patient ID: Kent Hernandez, male    DOB: February 23, 1986, 30 y.o.   MRN: [id number]  HPI Patient has had a sore throat for 24 hours. He has some mild rhinorrhea. He has some mild tenderness in his left neck along his lymph nodes. He denies any fever. He denies any cough. He denies any nausea vomiting or diarrhea. He denies any sick contacts. Strep test today is negative Past Medical History  Diagnosis Date  . H. pylori infection   . Esophageal reflux   . Allergic rhinitis   . Genital warts    No past surgical history on file. Current Outpatient Prescriptions on File Prior to Visit  Medication Sig Dispense Refill  . cetirizine (ZYRTEC) 10 MG tablet Take 10 mg by mouth 2 (two) times daily.      . ciclesonide (OMNARIS) 50 MCG/ACT nasal spray Place 2 sprays into both nostrils daily. 12.5 g 5  . dexlansoprazole (DEXILANT) 60 MG capsule TAKE ONE (1) CAPSULE EACH DAY 90 capsule 1   No current facility-administered medications on file prior to visit.   No Known Allergies Social History   Social History  . Marital Status: Married    Spouse Name: N/A  . Number of Children: N/A  . Years of Education: N/A   Occupational History  . Store Production assistant, radio   Social History Main Topics  . Smoking status: Former Smoker    Types: Cigarettes  . Smokeless tobacco: Former Systems developer    Quit date: 02/12/2011     Comment: Light use  . Alcohol Use: Yes  . Drug Use: No  . Sexual Activity: Not on file   Other Topics Concern  . Not on file   Social History Narrative      Review of Systems  All other systems reviewed and are negative.      Objective:   Physical Exam  Constitutional: He appears well-developed and well-nourished. No distress.  HENT:  Right Ear: External ear normal.  Left Ear: External ear normal.  Nose: Nose normal.  Mouth/Throat: Oropharynx is clear and moist. No oropharyngeal exudate.  Eyes: Conjunctivae are normal.  Neck: Neck supple.    Cardiovascular: Normal rate, regular rhythm and normal heart sounds.   Pulmonary/Chest: Effort normal and breath sounds normal. No respiratory distress. He has no wheezes. He has no rales.  Lymphadenopathy:    He has no cervical adenopathy.  Skin: He is not diaphoretic.  Vitals reviewed.         Assessment & Plan:  Sore throat - Plan: STREP GROUP A AG, W/REFLEX TO CULT  Appears to be viral pharyngitis. Use Cepacol lozenges as needed. Anticipate gradual improvement over the next 4 days.

## 2016-04-27 ENCOUNTER — Encounter: Payer: Self-pay | Admitting: Physician Assistant

## 2016-04-27 ENCOUNTER — Ambulatory Visit (INDEPENDENT_AMBULATORY_CARE_PROVIDER_SITE_OTHER): Payer: Managed Care, Other (non HMO) | Admitting: Physician Assistant

## 2016-04-27 VITALS — BP 102/70 | HR 68 | Temp 98.2°F | Resp 18 | Wt 170.0 lb

## 2016-04-27 DIAGNOSIS — H6121 Impacted cerumen, right ear: Secondary | ICD-10-CM | POA: Diagnosis not present

## 2016-04-27 NOTE — Progress Notes (Signed)
    Patient ID: Kent Hernandez MRN: [id number], DOB: [birth year], 30 y.o. Date of Encounter: 04/27/2016, 12:19 PM    Chief Complaint:  Chief Complaint  Patient presents with  . rt ear pain x 2 days     HPI: 30 y.o. year old white male says his right ear has been feeling discomfort deep in ear past couple days. No pain of outer ear. No drainage from ear.  No mucus rom nose, no sore throat, no fever, no chough.     Home Meds:   Outpatient Prescriptions Prior to Visit  Medication Sig Dispense Refill  . cetirizine (ZYRTEC) 10 MG tablet Take 10 mg by mouth 2 (two) times daily.      . ciclesonide (OMNARIS) 50 MCG/ACT nasal spray Place 2 sprays into both nostrils daily. (Patient not taking: Reported on 04/27/2016) 12.5 g 5  . dexlansoprazole (DEXILANT) 60 MG capsule TAKE ONE (1) CAPSULE EACH DAY 90 capsule 1   No facility-administered medications prior to visit.    Allergies: No Known Allergies    Review of Systems: See HPI for pertinent ROS. All other ROS negative.    Physical Exam: Blood pressure 102/70, pulse 68, temperature 98.2 F (36.8 C), temperature source Oral, resp. rate 18, weight 170 lb (77.111 kg)., Body mass index is 25.09 kg/(m^2). General: WNWD WM.  Appears in no acute distress. HEENT: Normocephalic, atraumatic, eyes without discharge, sclera non-icteric, nares are without discharge.Right Ear Canal---cerumen impaction/obstruction. Left ear canal normal. Left TM normal.  Oral cavity moist, posterior pharynx without exudate, erythema, peritonsillar abscess.  Neck: Supple. No thyromegaly. No lymphadenopathy. Lungs: Clear bilaterally to auscultation without wheezes, rales, or rhonchi. Breathing is unlabored. Heart: Regular rhythm. No murmurs, rubs, or gallops. Msk:  Strength and tone normal for age. Extremities/Skin: Warm and dry.  Neuro: Alert and oriented X 3. Moves all extremities spontaneously. Gait is normal. CNII-XII grossly in tact. Psych:  Responds to questions  appropriately with a normal affect.     ASSESSMENT AND PLAN:  30 y.o. year old male with  1. Cerumen impaction, right Irrigated today in office.  Discussed use of otc drops to prevent re-occurrence.   [address] [place], [place], [facility] 04/27/2016 12:19 PM

## 2016-08-09 ENCOUNTER — Ambulatory Visit (INDEPENDENT_AMBULATORY_CARE_PROVIDER_SITE_OTHER): Payer: Managed Care, Other (non HMO) | Admitting: Family Medicine

## 2016-08-09 ENCOUNTER — Encounter: Payer: Self-pay | Admitting: Family Medicine

## 2016-08-09 VITALS — BP 138/68 | HR 78 | Temp 98.6°F | Resp 14 | Ht 66.0 in | Wt 170.0 lb

## 2016-08-09 DIAGNOSIS — M5030 Other cervical disc degeneration, unspecified cervical region: Secondary | ICD-10-CM

## 2016-08-09 DIAGNOSIS — K219 Gastro-esophageal reflux disease without esophagitis: Secondary | ICD-10-CM | POA: Diagnosis not present

## 2016-08-09 MED ORDER — LANSOPRAZOLE 30 MG PO TBDP: 30.0000 mg | ORAL_TABLET | Freq: Every day | ORAL | 11 refills | Status: DC

## 2016-08-09 MED ORDER — MELOXICAM 15 MG PO TABS: 15.0000 mg | ORAL_TABLET | Freq: Every day | ORAL | 1 refills | Status: DC

## 2016-08-09 MED ORDER — HYDROCODONE-ACETAMINOPHEN 5-325 MG PO TABS: 1.0000 | ORAL_TABLET | Freq: Four times a day (QID) | ORAL | 0 refills | Status: DC | PRN

## 2016-08-09 NOTE — Assessment & Plan Note (Signed)
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 He has had imaging performed. There are no red flags today. We will con down the inflammation with meloxicam once a day for 2 weeks discuss alternate heat and ice to the neck. Given him hydrocodone to use for the evening his difficulty with sleeping because of the pain. This is not improved and he will be referred back to his neurosurgeon to have the injections performed.

## 2016-08-09 NOTE — Patient Instructions (Signed)
Take mobic once a day with food for 2 weeks -inflammation Alternate heat and ice Norco for severe pain  Prevacid for reflux F/U as needed

## 2016-08-09 NOTE — Progress Notes (Signed)
   Subjective:    Patient ID: Kent Hernandez, male    DOB: 10/07/1986, 30 y.o.   MRN: [id number]  Patient presents for Neck Pain (x2 days- states that he has known DDD and buldging disc in neck- sees Durene Cal- states that he turned head and pain radiated down spine)  Issue here with acute on chronic neck pain. He has history of degenerative disc disease with spondylosis at C5-C6. He had MRI back in 2015 was seen by neurosurgery. He has not had any significant problems since then but occasionally his neck does flareup. 2 days ago he was sitting at his work and he turned suddenly felt a pop and the pain similar to what he had in 46. He feels a tightness in his neck but he denies any current tingling or numbness in his fingertips. He states he was told by neurosurgery that if his neck worse and he would first need spinal injections and likely neck surgery. This seems to run in his family and occurs in an early age.  Reviewed neurosurgery notes from 2015 as well as his MRI  Request script for his reflux  Review Of Systems:  GEN- denies fatigue, fever, weight loss,weakness, recent illness HEENT- denies eye drainage, change in vision, nasal discharge, CVS- denies chest pain, palpitations RESP- denies SOB, cough, wheeze ABD- denies N/V, change in stools, abd pain GU- denies dysuria, hematuria, dribbling, incontinence MSK- + joint pain, muscle aches, injury Neuro- denies headache, dizziness, syncope, seizure activity       Objective:    BP 138/68 (BP Location: Left Arm, Patient Position: Sitting, Cuff Size: Normal)   Pulse 78   Temp 98.6 F (37 C) (Oral)   Resp 14   Ht 5\' 6"  (1.676 m)   Wt 170 lb (77.1 kg)   BMI 27.44 kg/m  GEN- NAD, alert and oriented x3 HEENT- PERRL, EOMI, non injected sclera, pink conjunctiva, MMM, oropharynx clear Neck- Supple, stiff ROM, mild TTP lower C spine, no spasm, neg spurlings  Neuro- normal tone UE, sensation grossly in tact Upper ext Motor- rotator cuff  In tact, FROM shoulders  Radial 2+         Assessment & Plan:      Problem List Items Addressed This Visit    GERD (gastroesophageal reflux disease)    We'll send in Prevacid 30 mg one nevus as he will also be on anti-inflammatories      Relevant Medications   lansoprazole (PREVACID SOLUTAB) 30 MG disintegrating tablet   DDD (degenerative disc disease), cervical    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 He has had imaging performed. There are no red flags today. We will con down the inflammation with meloxicam once a day for 2 weeks discuss alternate heat and ice to the neck. Given him hydrocodone to use for the evening his difficulty with sleeping because of the pain. This is not improved and he will be referred back to his neurosurgeon to have the injections performed.      Relevant Medications   meloxicam (MOBIC) 15 MG tablet   HYDROcodone-acetaminophen (NORCO) 5-325 MG tablet    Other Visit Diagnoses   None.     Note: This dictation was prepared with Dragon dictation along with smaller phrase technology. Any transcriptional errors that result from this process are unintentional.

## 2016-08-09 NOTE — Assessment & Plan Note (Signed)
We'll send in Prevacid 30 mg one nevus as he will also be on anti-inflammatories

## 2016-08-29 ENCOUNTER — Other Ambulatory Visit: Payer: Self-pay | Admitting: *Deleted

## 2016-08-29 MED ORDER — LANSOPRAZOLE 30 MG PO CPDR: 30.0000 mg | DELAYED_RELEASE_CAPSULE | Freq: Every day | ORAL | 3 refills | Status: DC

## 2016-11-08 ENCOUNTER — Emergency Department (HOSPITAL_COMMUNITY)
Admission: EM | Admit: 2016-11-08 | Discharge: 2016-11-08 | Disposition: A | Discharge status: Home / Self Care | Payer: Managed Care, Other (non HMO) | Attending: Emergency Medicine | Admitting: Emergency Medicine

## 2016-11-08 ENCOUNTER — Encounter (HOSPITAL_COMMUNITY): Payer: Self-pay

## 2016-11-08 ENCOUNTER — Emergency Department (HOSPITAL_COMMUNITY): Payer: Managed Care, Other (non HMO)

## 2016-11-08 DIAGNOSIS — Z79899 Other long term (current) drug therapy: Secondary | ICD-10-CM | POA: Insufficient documentation

## 2016-11-08 DIAGNOSIS — R079 Chest pain, unspecified: Secondary | ICD-10-CM | POA: Diagnosis present

## 2016-11-08 DIAGNOSIS — Z87891 Personal history of nicotine dependence: Secondary | ICD-10-CM | POA: Diagnosis not present

## 2016-11-08 LAB — CBC
HEMATOCRIT: 47.2 % (ref 39.0–52.0)
HEMOGLOBIN: 16.5 g/dL (ref 13.0–17.0)
MCH: 29.8 pg (ref 26.0–34.0)
MCHC: 35 g/dL (ref 30.0–36.0)
MCV: 85.2 fL (ref 78.0–100.0)
Platelets: 309 10*3/uL (ref 150–400)
RBC: 5.54 MIL/uL (ref 4.22–5.81)
RDW: 12.5 % (ref 11.5–15.5)
WBC: 5.7 10*3/uL (ref 4.0–10.5)

## 2016-11-08 LAB — BASIC METABOLIC PANEL
ANION GAP: 10 (ref 5–15)
BUN: 9 mg/dL (ref 6–20)
CALCIUM: 9.8 mg/dL (ref 8.9–10.3)
CHLORIDE: 102 mmol/L (ref 101–111)
CO2: 27 mmol/L (ref 22–32)
Creatinine, Ser: 1.03 mg/dL (ref 0.61–1.24)
GFR calc Af Amer: >60 mL/min (ref >60)
GFR calc non Af Amer: >60 mL/min (ref >60)
GLUCOSE: 105 mg/dL — AB (ref 65–99)
POTASSIUM: 4 mmol/L (ref 3.5–5.1)
Sodium: 139 mmol/L (ref 135–145)

## 2016-11-08 LAB — I-STAT TROPONIN, ED: Troponin i, poc: 0 ng/mL (ref 0.00–0.08)

## 2016-11-08 NOTE — ED Notes (Signed)
Pt ambulatory to room with steady gait, NAD and connected to cardiac monitor, pulse ox and BP cuff.

## 2016-11-08 NOTE — Discharge Instructions (Signed)
The tests today, were reassuring.  You are at low risk for cardiac disease. However, since you have had previous episodes of chest pain, and some family members with heart disease in their 73s, it makes sense to get a cardiac stress test done.  In the meantime; make sure you are getting plenty of rest, eating and drinking regularly, and work on stress reduction.

## 2016-11-08 NOTE — ED Triage Notes (Signed)
Pt presents for evaluation of L sided chest pain last approx 30 seconds with radiation to L arm and lightheadedness. Pt. Reports arm still feels tingly. Pt denies CP in triage, states does have SOB. Pt reports hx of CP but denies cardiac hx.

## 2016-11-08 NOTE — ED Notes (Signed)
Hooked patient back up too monitor after xray

## 2016-11-08 NOTE — ED Provider Notes (Signed)
[facility] DEPT Provider Note   CSN: [id number] Arrival date & time: 11/08/16  [id number]     History   Chief Complaint Chief Complaint  Patient presents with  . Chest Pain    HPI Kent Hernandez is a 30 y.o. male.  He presents for evaluation of sharp stabbing chest pain felt in anterior chest bilaterally, which was transient, while driving to work this morning. Since that time he has had intermittent shortness of breath and tingling sensation in his left arm, as well. He has a history of similar chest pain in the past, for several years. He has been taking a PPI for 5 years because of "reflux". He states that he had a normal upper endoscopy done, 3 years ago. He has a family history of cardiac disease in the 59s, and at least 2 male relatives. He states that his lipid panel was done recently and is normal. He denies dizziness, fever, chills, cough, back pain or headache. There are no other known modifying factors.  HPI  Past Medical History:  Diagnosis Date  . Allergic rhinitis   . DDD (degenerative disc disease), cervical 2015  . Esophageal reflux   . Genital warts   . H. pylori infection     Patient Active Problem List   Diagnosis Date Noted  . DDD (degenerative disc disease), cervical 08/09/2016  . Skin lesion 08/04/2013  . Genital warts   . Allergic rhinitis   . GERD (gastroesophageal reflux disease) 07/11/2011  . Dysphagia, unspecified(787.20) 06/12/2011  . Esophageal reflux 06/12/2011    History reviewed. No pertinent surgical history.     Home Medications    Prior to Admission medications   Medication Sig Start Date End Date Taking? Authorizing Provider  cetirizine (ZYRTEC) 10 MG tablet Take 10 mg by mouth 2 (two) times daily.     Yes Historical Provider, MD  fluticasone (FLONASE SENSIMIST) 27.5 MCG/SPRAY nasal spray Place 2 sprays into the nose daily. OTC   Yes Historical Provider, MD  lansoprazole (PREVACID) 30 MG capsule Take 1 capsule (30 mg total) by  mouth daily at 12 noon. 08/29/16  Yes Alycia Rossetti, MD  HYDROcodone-acetaminophen (NORCO) 5-325 MG tablet Take 1 tablet by mouth every 6 (six) hours as needed for moderate pain. Patient not taking: Reported on 11/08/2016 08/09/16   Alycia Rossetti, MD  meloxicam (MOBIC) 15 MG tablet Take 1 tablet (15 mg total) by mouth daily. Patient not taking: Reported on 11/08/2016 08/09/16   Alycia Rossetti, MD    Family History Family History  Problem Relation Age of Onset  . Diabetes Maternal Uncle   . Diabetes Paternal Grandfather   . Hypertension Father   . Kidney disease Father   . Cancer Maternal Grandmother     Social History Social History  Substance Use Topics  . Smoking status: Former Smoker    Types: Cigarettes  . Smokeless tobacco: Former Systems developer    Quit date: 02/12/2011     Comment: Light use  . Alcohol use Yes     Comment: occ     Allergies   Patient has no known allergies.   Review of Systems Review of Systems  All other systems reviewed and are negative.    Physical Exam Updated Vital Signs BP 135/91 (BP Location: Left Arm)   Pulse 93   Temp 98.1 F (36.7 C) (Oral)   Resp 18   Ht 5\' 9"  (1.753 m)   Wt 164 lb (74.4 kg)   SpO2 96%  BMI 24.22 kg/m   Physical Exam  Constitutional: He is oriented to person, place, and time. He appears well-developed and well-nourished.  HENT:  Head: Normocephalic and atraumatic.  Right Ear: External ear normal.  Left Ear: External ear normal.  Eyes: Conjunctivae and EOM are normal. Pupils are equal, round, and reactive to light.  Neck: Normal range of motion and phonation normal. Neck supple.  Cardiovascular: Normal rate, regular rhythm and normal heart sounds.   Pulmonary/Chest: Effort normal and breath sounds normal. He exhibits no bony tenderness.  Abdominal: Soft. There is no tenderness.  Musculoskeletal: Normal range of motion.  Neurological: He is alert and oriented to person, place, and time. No cranial nerve  deficit or sensory deficit. He exhibits normal muscle tone. Coordination normal.  Skin: Skin is warm, dry and intact.  Psychiatric: He has a normal mood and affect. His behavior is normal. Judgment and thought content normal.  Nursing note and vitals reviewed.    ED Treatments / Results  Labs (all labs ordered are listed, but only abnormal results are displayed) Labs Reviewed  BASIC METABOLIC PANEL - Abnormal; Notable for the following:       Result Value   Glucose, Bld 105 (*)    All other components within normal limits  CBC  I-STAT TROPOININ, ED    EKG  EKG Interpretation  Date/Time:  Wednesday November 08 2016 10:25:59 EST Ventricular Rate:  86 PR Interval:  146 QRS Duration: 92 QT Interval:  362 QTC Calculation: 433 R Axis:   68 Text Interpretation:  Sinus rhythm with marked sinus arrhythmia Otherwise normal ECG No old tracing to compare Confirmed by [facility]  MD, Thersia Petraglia [phone]) on 11/08/2016 11:32:11 AM       Radiology Dg Chest 2 View  Result Date: 11/08/2016 CLINICAL DATA:  Chest pain and left arm numbness since this morning. EXAM: CHEST  2 VIEW COMPARISON:  None. FINDINGS: The heart size and mediastinal contours are within normal limits. Both lungs are clear. The visualized skeletal structures are unremarkable. IMPRESSION: Normal chest. Electronically Signed   By: Lorriane Shire M.D.   On: 11/08/2016 11:03    Procedures Procedures (including critical care time)  Medications Ordered in ED Medications - No data to display   Initial Impression / Assessment and Plan / ED Course  I have reviewed the triage vital signs and the nursing notes.  Pertinent labs & imaging results that were available during my care of the patient were reviewed by me and considered in my medical decision making (see chart for details).  Clinical Course as of Nov 08 1148  Wed Nov 08, 2016  1148 Normal Troponin i, poc: 0.00 [EW]  1148 Normal DG Chest 2 View [EW]    Clinical Course  User Index [EW] Daleen Bo, MD    Medications - No data to display  Patient Vitals for the past 24 hrs:  BP Temp Temp src Pulse Resp SpO2 Height Weight  11/08/16 1024 - - - - - - 5\' 9"  (1.753 m) 164 lb (74.4 kg)  11/08/16 1022 135/91 98.1 F (36.7 C) Oral 93 18 96 % - -    11:47 AM Reevaluation with update and discussion. After initial assessment and treatment, an updated evaluation reveals No change in clinical status. Findings discussed with patient and family members, all questions were answered. Anokhi Shannon L    Final Clinical Impressions(s) / ED Diagnoses   Final diagnoses:  Nonspecific chest pain    Nonspecific chest pain, patient with stressful  job, and prior similar chest pain. He is low risk for cardiac disease, and pain free while examined in the emergency department. ED evaluation is normal There is no indication for further treatment. Here, or hospitalization.   Nursing Notes Reviewed/ Care Coordinated Applicable Imaging Reviewed Interpretation of Laboratory Data incorporated into ED treatment  The patient appears reasonably screened and/or stabilized for discharge and I doubt any other medical condition or other [facility] requiring further screening, evaluation, or treatment in the ED at this time prior to discharge.  Plan: Home Medications- continue; Home Treatments- rest, stress modification; return here if the recommended treatment, does not improve the symptoms; Recommended follow up- PCP, one week, consider stress test.   New Prescriptions New Prescriptions   No medications on file     Daleen Bo, MD 11/08/16 1149

## 2016-11-09 ENCOUNTER — Telehealth: Payer: Self-pay | Admitting: Cardiovascular Disease

## 2016-11-09 NOTE — Telephone Encounter (Signed)
[name] is calling on behalf of her brother (patient, Kent Hernandez.) [name] went to the ER yesterday with chest pains and was seen by Dr.Cooper. Dr. Burt Knack advised [name] to contact his office if he wants to been seen for a followup by Dr. Burt Knack. Please call [name] [phone].) Thanks.

## 2016-11-14 ENCOUNTER — Ambulatory Visit: Payer: Managed Care, Other (non HMO) | Admitting: Family Medicine

## 2016-11-15 NOTE — Telephone Encounter (Signed)
Follow Up:    Sister calling back again today. Pt had episode yesterday,he was dizzy,chest felt tight,felt like he was going to pass out. Pt needs to be seen asap.Erline Levine said she had talked to Dr Burt Knack and Ander Purpura about this. She have been waiting since last week to hear something.a

## 2016-11-16 NOTE — Telephone Encounter (Signed)
I have been out of the office 11/09/16-11/16/2016.  I was contacted yesterday by Jari Sportsman in regards to arranging an appointment for this pt. The pt has been scheduled to see Dr Burt Knack on 11/17/2016.

## 2016-11-17 ENCOUNTER — Ambulatory Visit (INDEPENDENT_AMBULATORY_CARE_PROVIDER_SITE_OTHER): Payer: Managed Care, Other (non HMO) | Admitting: Cardiovascular Disease

## 2016-11-17 ENCOUNTER — Encounter: Payer: Self-pay | Admitting: Cardiovascular Disease

## 2016-11-17 VITALS — BP 116/90 | HR 75 | Ht 69.0 in | Wt 164.4 lb

## 2016-11-17 DIAGNOSIS — R079 Chest pain, unspecified: Secondary | ICD-10-CM | POA: Diagnosis not present

## 2016-11-17 NOTE — Progress Notes (Signed)
Cardiology Office Note Date:  11/19/2016   ID:  ARAV SCROGGIN, DOB Feb 01, 1986, MRN [id number]  PCP:  Odette Fraction, MD  Cardiologist:  Sherren Mocha, MD    Chief Complaint  Patient presents with  . Chest Pain  . Dizziness   History of Present Illness: Kent Hernandez is a 31 y.o. male who presents for evaluation of chest pain.  He was driving to work last week and felt a pain in his chest like someone 'stuck a knife in his chest.' He felt flushed and dizzy with this. He has also has intermittent pain in the left arm with numbness and tingling, unrelated to physical exertion. He was evaluated in the ER 12/27 and had normal cardiac biomarkers and a normal EKG. Admits he's been under a lot of stress at work. Also had a recent episode of dizziness when standing up and nearly lost consciousness. This occurred after he had bent forward.   He works as a Dance movement psychotherapist at [employer] and does 5-6 miles of walking in a normal day. Otherwise he is not engaged in regular exercise.    Past Medical History:  Diagnosis Date  . Allergic rhinitis   . DDD (degenerative disc disease), cervical 2015  . Esophageal reflux   . Genital warts   . H. pylori infection     No past surgical history on file.  Current Outpatient Prescriptions  Medication Sig Dispense Refill  . cetirizine (ZYRTEC) 10 MG tablet Take 10 mg by mouth 2 (two) times daily.      . fluticasone (FLONASE SENSIMIST) 27.5 MCG/SPRAY nasal spray Place 2 sprays into the nose daily. OTC    . lansoprazole (PREVACID) 30 MG capsule Take 1 capsule (30 mg total) by mouth daily at 12 noon. 30 capsule 3   No current facility-administered medications for this visit.     Allergies:   Patient has no known allergies.   Social History:  The patient  reports that he has quit smoking. His smoking use included Cigarettes. He quit smokeless tobacco use about 5 years ago. He reports that he drinks alcohol. He reports that he does not use drugs.    Family History:  The patient's  family history includes Cancer in his maternal grandmother; Diabetes in his maternal uncle and paternal grandfather; Hypertension in his father; Kidney disease in his father.   His father has 2 uncles who had MI's at age 61. Otherwise no premature CAD int he family.   ROS:  Please see the history of present illness.  Otherwise, review of systems is positive for cough, visual disturbance, chest pain, dizziness, anxiety.  All other systems are reviewed and negative.    PHYSICAL EXAM: VS:  BP 116/90   Pulse 75   Ht 5\' 9"  (1.753 m)   Wt 164 lb 6.4 oz (74.6 kg)   BMI 24.28 kg/m  , BMI Body mass index is 24.28 kg/m. GEN: Well nourished, well developed, in no acute distress  HEENT: normal  Neck: no JVD, no masses. No carotid bruits Cardiac: RRR without murmur or gallop                Respiratory:  clear to auscultation bilaterally, normal work of breathing GI: soft, nontender, nondistended, + BS MS: no deformity or atrophy  Ext: no pretibial edema, pedal pulses 2+= bilaterally Skin: warm and dry, no rash Neuro:  Strength and sensation are intact Psych: euthymic mood, full affect  EKG:  EKG is not ordered today.  The ekg done in the emergency room shows sinus arrhythmia without ST-T changes  Recent Labs: 11/08/2016: BUN 9; Creatinine, Ser 1.03; Hemoglobin 16.5; Platelets 309; Potassium 4.0; Sodium 139   Lipid Panel     Component Value Date/Time   CHOL 221 (H) 06/16/2013 1214   TRIG 207 (H) 06/16/2013 1214   HDL 48 06/16/2013 1214   CHOLHDL 4.6 06/16/2013 1214   VLDL 41 (H) 06/16/2013 1214   LDLCALC 132 (H) 06/16/2013 1214      Wt Readings from Last 3 Encounters:  11/17/16 164 lb 6.4 oz (74.6 kg)  11/08/16 164 lb (74.4 kg)  08/09/16 170 lb (77.1 kg)      ASSESSMENT AND PLAN: Chest pain, atypical. Exam, cardiac markers, and EKG within normal limits. Pt at low risk of CAD. Will check a non-imaging exercise treadmill stress test. Do not  anticipate further evaluation unless sress test shows significant abnormality.   He has GERD as well as high-stress at his work. Both of these issues are more likely to be responsible for his chest pain symptoms than CAD.  Current medicines are reviewed with the patient today.  The patient does not have concerns regarding medicines.  Labs/ tests ordered today include:   Orders Placed This Encounter  Procedures  . Exercise Tolerance Test   Disposition:   FU prn  Signed, Sherren Mocha, MD  11/19/2016 3:25 PM    [facility] [place], [place], [place]  [address] Phone: [phone]; Fax: [phone]

## 2016-11-17 NOTE — Patient Instructions (Signed)
Medication Instructions:  Your physician recommends that you continue on your current medications as directed. Please refer to the Current Medication list given to you today.  Labwork: No new orders.   Testing/Procedures: Your physician has requested that you have an exercise tolerance test. Please follow instruction sheet, as given.  Follow-Up: Your physician recommends that you schedule a follow-up appointment as needed with Dr Burt Knack.    Any Other Special Instructions Will Be Listed Below (If Applicable).     If you need a refill on your cardiac medications before your next appointment, please call your pharmacy.

## 2016-11-21 ENCOUNTER — Ambulatory Visit (INDEPENDENT_AMBULATORY_CARE_PROVIDER_SITE_OTHER): Payer: Managed Care, Other (non HMO)

## 2016-11-21 DIAGNOSIS — R079 Chest pain, unspecified: Secondary | ICD-10-CM

## 2016-11-21 LAB — EXERCISE TOLERANCE TEST
CHL CUP RESTING HR STRESS: 66 {beats}/min
CHL CUP STRESS STAGE 2 SPEED: 0.8 mph
CHL CUP STRESS STAGE 3 HR: 88 {beats}/min
CHL CUP STRESS STAGE 3 SPEED: 1 mph
CHL CUP STRESS STAGE 4 DBP: 84 mmHg
CHL CUP STRESS STAGE 4 GRADE: 10 %
CHL CUP STRESS STAGE 5 SBP: 128 mmHg
CHL CUP STRESS STAGE 8 GRADE: 0 %
CHL CUP STRESS STAGE 8 HR: 141 {beats}/min
CHL CUP STRESS STAGE 8 SBP: 163 mmHg
CHL CUP STRESS STAGE 8 SPEED: 1.5 mph
CHL CUP STRESS STAGE 9 DBP: 93 mmHg
CHL CUP STRESS STAGE 9 HR: 94 {beats}/min
CHL CUP STRESS STAGE 9 SBP: 129 mmHg
CHL RATE OF PERCEIVED EXERTION: 16
CSEPED: 11 min
CSEPEDS: 0 s
CSEPHR: 92 %
CSEPPHR: 171 {beats}/min
CSEPPMHR: 90 %
Estimated workload: 13.4 METS
MPHR: 188 {beats}/min
Stage 1 DBP: 97 mmHg
Stage 1 Grade: 0 %
Stage 1 HR: 75 {beats}/min
Stage 1 SBP: 129 mmHg
Stage 1 Speed: 0 mph
Stage 2 Grade: 0 %
Stage 2 HR: 86 {beats}/min
Stage 3 Grade: 0 %
Stage 4 HR: 99 {beats}/min
Stage 4 SBP: 130 mmHg
Stage 4 Speed: 1.7 mph
Stage 5 DBP: 88 mmHg
Stage 5 Grade: 12 %
Stage 5 HR: 120 {beats}/min
Stage 5 Speed: 2.5 mph
Stage 6 DBP: 84 mmHg
Stage 6 Grade: 14 %
Stage 6 HR: 133 {beats}/min
Stage 6 SBP: 158 mmHg
Stage 6 Speed: 3.4 mph
Stage 7 Grade: 16 %
Stage 7 HR: 171 {beats}/min
Stage 7 Speed: 4.2 mph
Stage 8 DBP: 77 mmHg
Stage 9 Grade: 0 %
Stage 9 Speed: 0 mph

## 2017-01-03 ENCOUNTER — Ambulatory Visit (INDEPENDENT_AMBULATORY_CARE_PROVIDER_SITE_OTHER): Payer: 59 | Admitting: Physician Assistant

## 2017-01-03 ENCOUNTER — Encounter: Payer: Self-pay | Admitting: Physician Assistant

## 2017-01-03 VITALS — BP 118/84 | HR 101 | Temp 98.0°F | Resp 18 | Wt 165.4 lb

## 2017-01-03 DIAGNOSIS — J988 Other specified respiratory disorders: Secondary | ICD-10-CM | POA: Diagnosis not present

## 2017-01-03 DIAGNOSIS — B9689 Other specified bacterial agents as the cause of diseases classified elsewhere: Principal | ICD-10-CM

## 2017-01-03 MED ORDER — AZITHROMYCIN 250 MG PO TABS: ORAL_TABLET | ORAL | 0 refills | Status: DC

## 2017-01-03 NOTE — Progress Notes (Signed)
    Patient ID: URI ARCHILLA MRN: [id number], DOB: 15-Apr-1986, 31 y.o. Date of Encounter: 01/03/2017, 2:24 PM    Chief Complaint:  Chief Complaint  Patient presents with  . Cough    x4days  . Sore Throat  . nasal drainage     HPI: 31 y.o. year old male presents with above. presents with above.   Symptoms started Saturday 12/30/2016. At that time started with sore throat and feeling stopped up and congested. Has cough--productive of yellow phlegm. Chest burns when he coughs. Not getting better at all.     Home Meds:   Outpatient Medications Prior to Visit  Medication Sig Dispense Refill  . cetirizine (ZYRTEC) 10 MG tablet Take 10 mg by mouth 2 (two) times daily.      . fluticasone (FLONASE SENSIMIST) 27.5 MCG/SPRAY nasal spray Place 2 sprays into the nose daily. OTC    . lansoprazole (PREVACID) 30 MG capsule Take 1 capsule (30 mg total) by mouth daily at 12 noon. 30 capsule 3   No facility-administered medications prior to visit.     Allergies: No Known Allergies    Review of Systems: See HPI for pertinent ROS. All other ROS negative.    Physical Exam: Blood pressure 118/84, pulse (!) 101, temperature 98 F (36.7 C), temperature source Oral, resp. rate 18, weight 165 lb 6.4 oz (75 kg), SpO2 98 %., Body mass index is 24.43 kg/m. General: WNWD WM.  Appears in no acute distress. HEENT: Normocephalic, atraumatic, eyes without discharge, sclera non-icteric, nares are without discharge. Bilateral auditory canals clear, TM's are without perforation, pearly grey and translucent with reflective cone of light bilaterally. Oral cavity moist, posterior pharynx with mild - moderate erythema, no exudate, no peritonsillar abscess.  Neck: Supple. No thyromegaly. No lymphadenopathy. He reports there is no tenderness with palpation of nodes and there are no enlarged nodes.  Lungs: Clear bilaterally to auscultation without wheezes, rales, or rhonchi. Breathing is unlabored. Heart: Regular rhythm. No murmurs,  rubs, or gallops. Msk:  Strength and tone normal for age. Extremities/Skin: Warm and dry. Neuro: Alert and oriented X 3. Moves all extremities spontaneously. Gait is normal. CNII-XII grossly in tact. Psych:  Responds to questions appropriately with a normal affect.     ASSESSMENT AND PLAN:  31 y.o. year old male with  1. Bacterial respiratory infection Take antibiotic as directed. Can use over-the-counter medicines as needed for symptom relief as well. F/U if symptoms worsen significantly or do not resolve within 1 week after completion of antibiotic. - azithromycin (ZITHROMAX) 250 MG tablet; Day 1: Take 2 daily. Days 2 -5: Take 1 daily.  Dispense: 6 tablet; Refill: 0   Signed, [address] [place], [place], [facility] 01/03/2017 2:24 PM

## 2017-09-07 ENCOUNTER — Encounter: Payer: Self-pay | Admitting: Family Medicine

## 2017-09-07 ENCOUNTER — Ambulatory Visit (INDEPENDENT_AMBULATORY_CARE_PROVIDER_SITE_OTHER): Payer: 59 | Admitting: Family Medicine

## 2017-09-07 VITALS — BP 134/90 | HR 76 | Temp 97.8°F | Resp 14 | Ht 69.0 in | Wt 167.0 lb

## 2017-09-07 DIAGNOSIS — Z0000 Encounter for general adult medical examination without abnormal findings: Secondary | ICD-10-CM

## 2017-09-07 DIAGNOSIS — Z23 Encounter for immunization: Secondary | ICD-10-CM | POA: Diagnosis not present

## 2017-09-07 NOTE — Progress Notes (Signed)
Subjective:    Patient ID: Kent Hernandez, male    DOB: [birth year], 31 y.o.   MRN: [id number]  HPI  Patient is here today for a complete physical exam and he has no concerns.  He is due for a flu shot.  He is also due for a tetanus shot.  We discussed HIV screening but the patient politely declined it due to lack of risk factors.  Otherwise his review of systems is completely normal  Past Medical History:  Diagnosis Date  . Allergic rhinitis   . DDD (degenerative disc disease), cervical 2015  . Esophageal reflux   . Genital warts   . H. pylori infection    No past surgical history on file. Current Outpatient Prescriptions on File Prior to Visit  Medication Sig Dispense Refill  . cetirizine (ZYRTEC) 10 MG tablet Take 10 mg by mouth 2 (two) times daily.      . fluticasone (FLONASE SENSIMIST) 27.5 MCG/SPRAY nasal spray Place 2 sprays into the nose daily. OTC    . lansoprazole (PREVACID) 30 MG capsule Take 1 capsule (30 mg total) by mouth daily at 12 noon. 30 capsule 3   No current facility-administered medications on file prior to visit.    No Known Allergies Social History   Social History  . Marital status: Married    Spouse name: N/A  . Number of children: N/A  . Years of education: N/A   Occupational History  . Store Production assistant, radio   Social History Main Topics  . Smoking status: Former Smoker    Types: Cigarettes  . Smokeless tobacco: Former Systems developer    Quit date: 02/12/2011     Comment: Light use  . Alcohol use Yes     Comment: occ  . Drug use: No  . Sexual activity: Yes   Other Topics Concern  . Not on file   Social History Narrative  . No narrative on file   Family History  Problem Relation Age of Onset  . Diabetes Maternal Uncle   . Diabetes Paternal Grandfather   . Heart disease Paternal Grandfather   . Hypertension Father   . Kidney disease Father   . Cancer Maternal Grandmother   . Heart disease Paternal Grandmother   '   Review of  Systems  All other systems reviewed and are negative.      Objective:   Physical Exam  Constitutional: He is oriented to person, place, and time. He appears well-developed and well-nourished. No distress.  HENT:  Head: Normocephalic and atraumatic.  Right Ear: External ear normal.  Left Ear: External ear normal.  Nose: Nose normal.  Mouth/Throat: Oropharynx is clear and moist. No oropharyngeal exudate.  Eyes: Pupils are equal, round, and reactive to light. Conjunctivae and EOM are normal. Right eye exhibits no discharge. Left eye exhibits no discharge. No scleral icterus.  Neck: Normal range of motion. Neck supple. No JVD present. No tracheal deviation present. No thyromegaly present.  Cardiovascular: Normal rate, regular rhythm, normal heart sounds and intact distal pulses.  Exam reveals no gallop and no friction rub.   No murmur heard. Pulmonary/Chest: Effort normal and breath sounds normal. No stridor. No respiratory distress. He has no wheezes. He has no rales. He exhibits no tenderness.  Abdominal: Soft. Bowel sounds are normal. He exhibits no distension and no mass. There is no tenderness. There is no rebound and no guarding. Hernia confirmed negative in the right inguinal area and confirmed negative in the left inguinal  area.  Genitourinary: Testes normal and penis normal. Right testis shows no mass and no tenderness. Left testis shows no mass and no tenderness.  Musculoskeletal: Normal range of motion. He exhibits no edema, tenderness or deformity.  Lymphadenopathy:    He has no cervical adenopathy.       Right: No inguinal adenopathy present.       Left: No inguinal adenopathy present.  Neurological: He is alert and oriented to person, place, and time. He has normal reflexes. He displays normal reflexes. No cranial nerve deficit. He exhibits normal muscle tone. Coordination normal.  Skin: Skin is warm. No rash noted. He is not diaphoretic. No erythema. No pallor.  Psychiatric: He  has a normal mood and affect. His behavior is normal. Judgment and thought content normal.  Vitals reviewed.         Assessment & Plan:  General medical exam  Physical exam today is completely normal except for his blood pressure which is borderline.  We recommended a low-sodium diet.  He will start to check his blood pressure every day at home and monitor the values.  He will increase his aerobic exercise.  He does have a family history of hypertension and coronary artery disease and therefore we will treat his blood pressure if progressively consistently greater than 140/90.  He had his lab work checked at physical this summer regarding work.  He states that his cholesterol and his blood sugar were excellent.  He will bring me a copy of the blood work to review.  He received his flu shot today.  He has a mild upper respiratory infection and this should improve on its own without medication.  I recommended symptomatic Tx only.  He also received his flu shot

## 2017-09-07 NOTE — Addendum Note (Signed)
Addended by: Shary Decamp B on: 09/07/2017 10:02 AM   Modules accepted: Orders

## 2017-09-14 ENCOUNTER — Telehealth: Payer: Self-pay

## 2017-09-14 NOTE — Telephone Encounter (Signed)
Patient called requesting an appointment to be seen. Patient states he dropped a piece of plywood on his right big toe and feels it may be broken it is blue and black. Patient was instructed to go to ER or urgent care for x ray to confirm. Patient declined stating he did not want to get a big bill and stated he would wait for an appointment with his pcp on 09/17/2017

## 2017-09-17 ENCOUNTER — Ambulatory Visit: Payer: Self-pay | Admitting: Family Medicine

## 2017-10-17 ENCOUNTER — Telehealth: Payer: Self-pay | Admitting: Family Medicine

## 2017-10-17 NOTE — Telephone Encounter (Signed)
Pt pulled muscle in neck again, states he still has some flexeril left over from last time and doesn't know how he should take it can we call and advise?

## 2017-10-17 NOTE — Telephone Encounter (Signed)
Pt called and told he could take the Flexeril as long as it is not expired and he could take q 8 hours and take motrin for pain. Pt verbalized understanding.

## 2017-12-31 ENCOUNTER — Encounter: Payer: Self-pay | Admitting: Family Medicine

## 2017-12-31 ENCOUNTER — Ambulatory Visit (INDEPENDENT_AMBULATORY_CARE_PROVIDER_SITE_OTHER): Payer: 59 | Admitting: Family Medicine

## 2017-12-31 VITALS — BP 130/78 | HR 84 | Temp 98.4°F | Resp 14 | Ht 69.0 in | Wt 169.0 lb

## 2017-12-31 DIAGNOSIS — F419 Anxiety disorder, unspecified: Secondary | ICD-10-CM | POA: Diagnosis not present

## 2017-12-31 MED ORDER — CLONAZEPAM 0.5 MG PO TABS: 0.2500 mg | ORAL_TABLET | Freq: Three times a day (TID) | ORAL | 0 refills | Status: DC | PRN

## 2017-12-31 NOTE — Progress Notes (Signed)
Subjective:    Patient ID: Kent Hernandez, male    DOB: Jul 18, 1986, 32 y.o.   MRN: [id number]  HPI Patient presents with a multitude of physical complaints.  First, he reports hearing his heartbeat at night when he lies down to sleep.  Second he feels like his heart is going to pound out of his chest at times for no reason.  Third he finds his blood pressure is fluctuating.  He occasionally sees the diastolic number above 90.  The majority at times it is less than 130/90.  Fourth he reports feeling dizzy at times.  Fifth he reports ringing in both ears right greater than left.  Sixth he reports decreased libido.  In general, he feels like something is wrong.  He always feels anxious.  He is under stress at work and at home.  There is always this prevailing sense of fear and anxiety about his health.  He is a very pleasant 32 year old gentleman but he appears extremely anxious sitting in the exam room.  He denies any depression or suicidal ideation.  There is no evidence of mania.  There is no evidence of hallucinations or delusions. Past Medical History:  Diagnosis Date  . Allergic rhinitis   . DDD (degenerative disc disease), cervical 2015  . Esophageal reflux   . Genital warts   . H. pylori infection    No past surgical history on file. Current Outpatient Medications on File Prior to Visit  Medication Sig Dispense Refill  . cetirizine (ZYRTEC) 10 MG tablet Take 10 mg by mouth 2 (two) times daily.      . fluticasone (FLONASE SENSIMIST) 27.5 MCG/SPRAY nasal spray Place 2 sprays into the nose daily. OTC    . lansoprazole (PREVACID) 30 MG capsule Take 1 capsule (30 mg total) by mouth daily at 12 noon. 30 capsule 3   No current facility-administered medications on file prior to visit.    No Known Allergies Social History   Socioeconomic History  . Marital status: Married    Spouse name: Not on file  . Number of children: Not on file  . Years of education: Not on file  . Highest education  level: Not on file  Social Needs  . Financial resource strain: Not on file  . Food insecurity - worry: Not on file  . Food insecurity - inability: Not on file  . Transportation needs - medical: Not on file  . Transportation needs - non-medical: Not on file  Occupational History  . 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 Smoking status: Former Smoker    Types: Cigarettes  . Smokeless tobacco: Former Systems developer    Quit date: 02/12/2011  . Tobacco comment: Light use  Substance and Sexual Activity  . Alcohol use: Yes    Comment: occ  . Drug use: No  . Sexual activity: Yes  Other Topics Concern  . Not on file  Social History Narrative  . Not on file      Review of Systems  All other systems reviewed and are negative.      Objective:   Physical Exam  Constitutional: He is oriented to person, place, and time. He appears well-developed and well-nourished. No distress.  HENT:  Right Ear: External ear normal.  Left Ear: External ear normal.  Nose: Nose normal.  Mouth/Throat: Oropharynx is clear and moist. No oropharyngeal exudate.  Eyes: Conjunctivae and EOM are normal. Pupils are equal, round, and reactive to  light.  Neck: Neck supple. No JVD present. No thyromegaly present.  Cardiovascular: Normal rate, regular rhythm and normal heart sounds. Exam reveals no gallop and no friction rub.  No murmur heard. Pulmonary/Chest: Effort normal and breath sounds normal. No respiratory distress. He has no wheezes. He has no rales. He exhibits no tenderness.  Abdominal: Soft. Bowel sounds are normal. He exhibits no distension. There is no tenderness. There is no rebound and no guarding.  Lymphadenopathy:    He has no cervical adenopathy.  Neurological: He is alert and oriented to person, place, and time. He has normal reflexes. No cranial nerve deficit. He exhibits normal muscle tone. Coordination normal.  Skin: He is not diaphoretic.  Psychiatric: His speech is  normal and behavior is normal. Judgment and thought content normal. His mood appears anxious. Cognition and memory are normal.  Vitals reviewed.         Assessment & Plan:  Anxiety disorder, unspecified type  I explained to the patient that I feel he is having anxiety and it will increase levels of anxiety is causing him to fixate on somatic complaints that otherwise he would ignore or not notice.  He tends to agree.  I have recommended a temporary test of using Klonopin 0.25 mg p.o. twice daily every day for the next 2 weeks just to try to take the edge off to see if symptoms improve.  If symptoms improve, I would then recommend a better long-term option such as an SSRI to reduce the dependency on benzodiazepines.  If symptoms do not improve, we may need to institute a more thorough diagnostic workup to workup each of his complaints.  However given his age and his relatively good health, I feel that is unlikely he is having 6 different problems simultaneously and I feel that anxiety may be contributing to all these problems and be the root cause.  He does have a cerumen impaction in his right ear that was removed easily with irrigation and lavage by my nurse today which may be contributing to the ringing in his ears.  Recheck in 2 weeks or sooner if worse

## 2018-01-14 ENCOUNTER — Encounter: Payer: Self-pay | Admitting: Family Medicine

## 2018-01-14 ENCOUNTER — Ambulatory Visit (INDEPENDENT_AMBULATORY_CARE_PROVIDER_SITE_OTHER): Payer: 59 | Admitting: Family Medicine

## 2018-01-14 VITALS — BP 120/76 | HR 84 | Temp 98.4°F | Resp 14 | Ht 69.0 in | Wt 169.0 lb

## 2018-01-14 DIAGNOSIS — F419 Anxiety disorder, unspecified: Secondary | ICD-10-CM | POA: Diagnosis not present

## 2018-01-14 NOTE — Progress Notes (Signed)
Subjective:    Patient ID: Kent Hernandez, male    DOB: 1986/05/11, 32 y.o.   MRN: [id number]  HPI  12/31/17 Patient presents with a multitude of physical complaints.  First, he reports hearing his heartbeat at night when he lies down to sleep.  Second he feels like his heart is going to pound out of his chest at times for no reason.  Third he finds his blood pressure is fluctuating.  He occasionally sees the diastolic number above 90.  The majority at times it is less than 130/90.  Fourth he reports feeling dizzy at times.  Fifth he reports ringing in both ears right greater than left.  Sixth he reports decreased libido.  In general, he feels like something is wrong.  He always feels anxious.  He is under stress at work and at home.  There is always this prevailing sense of fear and anxiety about his health.  He is a very pleasant 32 year old gentleman but he appears extremely anxious sitting in the exam room.  He denies any depression or suicidal ideation.  There is no evidence of mania.  There is no evidence of hallucinations or delusions.  At that time, my plan was: I explained to the patient that I feel he is having anxiety and it will increase levels of anxiety is causing him to fixate on somatic complaints that otherwise he would ignore or not notice.  He tends to agree.  I have recommended a temporary test of using Klonopin 0.25 mg p.o. twice daily every day for the next 2 weeks just to try to take the edge off to see if symptoms improve.  If symptoms improve, I would then recommend a better long-term option such as an SSRI to reduce the dependency on benzodiazepines.  If symptoms do not improve, we may need to institute a more thorough diagnostic workup to workup each of his complaints.  However given his age and his relatively good health, I feel that is unlikely he is having 6 different problems simultaneously and I feel that anxiety may be contributing to all these problems and be the root cause.   He does have a cerumen impaction in his right ear that was removed easily with irrigation and lavage by my nurse today which may be contributing to the ringing in his ears.  Recheck in 2 weeks or sooner if worse  01/14/18 Since starting Klonopin, patient feels much better.  He has been taking the medication 2-3 times a day as directed.  The majority of his symptoms have gone away.  He to now feels that the majority of his symptoms were due to anxiety.  He is here today to discuss options for treatment. Past Medical History:  Diagnosis Date  . Allergic rhinitis   . DDD (degenerative disc disease), cervical 2015  . Esophageal reflux   . Genital warts   . H. pylori infection    No past surgical history on file. Current Outpatient Medications on File Prior to Visit  Medication Sig Dispense Refill  . cetirizine (ZYRTEC) 10 MG tablet Take 10 mg by mouth 2 (two) times daily.      . clonazePAM (KLONOPIN) 0.5 MG tablet Take 0.5 tablets (0.25 mg total) by mouth 3 (three) times daily as needed for anxiety. 20 tablet 0  . fluticasone (FLONASE SENSIMIST) 27.5 MCG/SPRAY nasal spray Place 2 sprays into the nose daily. OTC    . lansoprazole (PREVACID) 30 MG capsule Take 1 capsule (30 mg  total) by mouth daily at 12 noon. 30 capsule 3   No current facility-administered medications on file prior to visit.    No Known Allergies Social History   Socioeconomic History  . Marital status: Married    Spouse name: Not on file  . Number of children: Not on file  . Years of education: Not on file  . Highest education level: Not on file  Social Needs  . Financial resource strain: Not on file  . Food insecurity - worry: Not on file  . Food insecurity - inability: Not on file  . Transportation needs - medical: Not on file  . Transportation needs - non-medical: Not on file  Occupational History  . 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 Smoking status: Former Smoker    Types:  Cigarettes  . Smokeless tobacco: Former Systems developer    Quit date: 02/12/2011  . Tobacco comment: Light use  Substance and Sexual Activity  . Alcohol use: Yes    Comment: occ  . Drug use: No  . Sexual activity: Yes  Other Topics Concern  . Not on file  Social History Narrative  . Not on file      Review of Systems  All other systems reviewed and are negative.      Objective:   Physical Exam  Constitutional: He is oriented to person, place, and time. He appears well-developed and well-nourished. No distress.  HENT:  Right Ear: External ear normal.  Left Ear: External ear normal.  Nose: Nose normal.  Mouth/Throat: Oropharynx is clear and moist. No oropharyngeal exudate.  Eyes: Conjunctivae and EOM are normal. Pupils are equal, round, and reactive to light.  Neck: Neck supple. No JVD present. No thyromegaly present.  Cardiovascular: Normal rate, regular rhythm and normal heart sounds. Exam reveals no gallop and no friction rub.  No murmur heard. Pulmonary/Chest: Effort normal and breath sounds normal. No respiratory distress. He has no wheezes. He has no rales. He exhibits no tenderness.  Abdominal: Soft. Bowel sounds are normal. He exhibits no distension. There is no tenderness. There is no rebound and no guarding.  Lymphadenopathy:    He has no cervical adenopathy.  Neurological: He is alert and oriented to person, place, and time. He has normal reflexes. No cranial nerve deficit. He exhibits normal muscle tone. Coordination normal.  Skin: He is not diaphoretic.  Psychiatric: He has a normal mood and affect. His speech is normal and behavior is normal. Judgment and thought content normal. Cognition and memory are normal.  Vitals reviewed.         Assessment & Plan:  Anxiety disorder, unspecified type  Patient feels reassured that the majority of his symptoms are due to anxiety.  He would like to discontinue the Klonopin and use the medication sparingly only as needed.  If he  finds that he is having to use the medication frequently, we can then consider starting Lexapro 10 mg a day as a preventative/control medication.  He will contact me later to let me know how often he is requiring the Klonopin.  He only has 5 pills remaining in his original prescription

## 2018-01-21 ENCOUNTER — Encounter: Payer: Self-pay | Admitting: Family Medicine

## 2018-01-22 ENCOUNTER — Other Ambulatory Visit: Payer: Self-pay | Admitting: Family Medicine

## 2018-01-22 ENCOUNTER — Encounter: Payer: Self-pay | Admitting: Family Medicine

## 2018-01-22 MED ORDER — ESCITALOPRAM OXALATE 10 MG PO TABS: 10.0000 mg | ORAL_TABLET | Freq: Every day | ORAL | 3 refills | Status: DC

## 2018-01-28 ENCOUNTER — Other Ambulatory Visit: Payer: Self-pay | Admitting: Family Medicine

## 2018-01-28 MED ORDER — ESCITALOPRAM OXALATE 10 MG PO TABS: 10.0000 mg | ORAL_TABLET | Freq: Every day | ORAL | 3 refills | Status: DC

## 2018-01-31 ENCOUNTER — Encounter: Payer: Self-pay | Admitting: Family Medicine

## 2018-02-01 ENCOUNTER — Other Ambulatory Visit: Payer: Self-pay | Admitting: Family Medicine

## 2018-02-01 MED ORDER — CYCLOBENZAPRINE HCL 10 MG PO TABS: 10.0000 mg | ORAL_TABLET | Freq: Three times a day (TID) | ORAL | 0 refills | Status: DC | PRN

## 2018-04-11 ENCOUNTER — Encounter: Payer: Self-pay | Admitting: Family Medicine

## 2018-04-11 ENCOUNTER — Ambulatory Visit (INDEPENDENT_AMBULATORY_CARE_PROVIDER_SITE_OTHER): Payer: 59 | Admitting: Family Medicine

## 2018-04-11 VITALS — BP 126/84 | HR 70 | Temp 98.5°F | Resp 12 | Ht 69.0 in | Wt 167.0 lb

## 2018-04-11 DIAGNOSIS — Z0000 Encounter for general adult medical examination without abnormal findings: Secondary | ICD-10-CM | POA: Diagnosis not present

## 2018-04-11 DIAGNOSIS — R5382 Chronic fatigue, unspecified: Secondary | ICD-10-CM

## 2018-04-11 DIAGNOSIS — Z114 Encounter for screening for human immunodeficiency virus [HIV]: Secondary | ICD-10-CM | POA: Diagnosis not present

## 2018-04-11 NOTE — Progress Notes (Signed)
Subjective:    Patient ID: Kent Hernandez, male    DOB: 12-Jan-1986, 32 y.o.   MRN: [id number]  HPI  12/31/17 Patient presents with a multitude of physical complaints.  First, he reports hearing his heartbeat at night when he lies down to sleep.  Second he feels like his heart is going to pound out of his chest at times for no reason.  Third he finds his blood pressure is fluctuating.  He occasionally sees the diastolic number above 90.  The majority at times it is less than 130/90.  Fourth he reports feeling dizzy at times.  Fifth he reports ringing in both ears right greater than left.  Sixth he reports decreased libido.  In general, he feels like something is wrong.  He always feels anxious.  He is under stress at work and at home.  There is always this prevailing sense of fear and anxiety about his health.  He is a very pleasant 33 year old gentleman but he appears extremely anxious sitting in the exam room.  He denies any depression or suicidal ideation.  There is no evidence of mania.  There is no evidence of hallucinations or delusions.  At that time, my plan was: I explained to the patient that I feel he is having anxiety and it will increase levels of anxiety is causing him to fixate on somatic complaints that otherwise he would ignore or not notice.  He tends to agree.  I have recommended a temporary test of using Klonopin 0.25 mg p.o. twice daily every day for the next 2 weeks just to try to take the edge off to see if symptoms improve.  If symptoms improve, I would then recommend a better long-term option such as an SSRI to reduce the dependency on benzodiazepines.  If symptoms do not improve, we may need to institute a more thorough diagnostic workup to workup each of his complaints.  However given his age and his relatively good health, I feel that is unlikely he is having 6 different problems simultaneously and I feel that anxiety may be contributing to all these problems and be the root cause.   He does have a cerumen impaction in his right ear that was removed easily with irrigation and lavage by my nurse today which may be contributing to the ringing in his ears.  Recheck in 2 weeks or sooner if worse  01/14/18 Since starting Klonopin, patient feels much better.  He has been taking the medication 2-3 times a day as directed.  The majority of his symptoms have gone away.  He to now feels that the majority of his symptoms were due to anxiety.  He is here today to discuss options for treatment.  At that time, my plan was: Patient feels reassured that the majority of his symptoms are due to anxiety.  He would like to discontinue the Klonopin and use the medication sparingly only as needed.  If he finds that he is having to use the medication frequently, we can then consider starting Lexapro 10 mg a day as a preventative/control medication.  He will contact me later to let me know how often he is requiring the Klonopin.  He only has 5 pills remaining in his original prescription.  04/11/18 Here for CPE.  He has no major medical concerns.  He does report decreased libido.  He also reports increasing fatigue.  He admits that some of this is due likely to caring for his 42-1/2-year-old child and also  the stress of his job.  Speaking of which, his anxiety has improved dramatically.  He does believe this is stress related to work.  He is due for an HIV screening test.  He is also due for a tetanus shot.  He politely declines the tetanus shot today however he would request an HIV screen.  He is also concerned that he may have low testosterone.  His father has similar symptoms and has low testosterone and he believes some of his fatigue and decreased libido may possibly be due to hypogonadism. Past Medical History:  Diagnosis Date  . Allergic rhinitis   . DDD (degenerative disc disease), cervical 2015  . Esophageal reflux   . Genital warts   . H. pylori infection    No past surgical history on  file. Current Outpatient Medications on File Prior to Visit  Medication Sig Dispense Refill  . cetirizine (ZYRTEC) 10 MG tablet Take 10 mg by mouth 2 (two) times daily.      . clonazePAM (KLONOPIN) 0.5 MG tablet Take 0.5 tablets (0.25 mg total) by mouth 3 (three) times daily as needed for anxiety. 20 tablet 0  . cyclobenzaprine (FLEXERIL) 10 MG tablet Take 1 tablet (10 mg total) by mouth 3 (three) times daily as needed for muscle spasms. 30 tablet 0  . escitalopram (LEXAPRO) 10 MG tablet Take 1 tablet (10 mg total) by mouth daily. 90 tablet 3  . fluticasone (FLONASE SENSIMIST) 27.5 MCG/SPRAY nasal spray Place 2 sprays into the nose daily. OTC    . lansoprazole (PREVACID) 30 MG capsule Take 1 capsule (30 mg total) by mouth daily at 12 noon. 30 capsule 3   No current facility-administered medications on file prior to visit.    No Known Allergies Social History   Socioeconomic History  . Marital status: Married    Spouse name: Not on file  . Number of children: Not on file  . Years of education: Not on file  . Highest education level: Not on file  Occupational History  . 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 Financial resource strain: Not on file  . Food insecurity:    Worry: Not on file    Inability: Not on file  . Transportation needs:    Medical: Not on file    Non-medical: Not on file  Tobacco Use  . Smoking status: Former Smoker    Types: Cigarettes  . Smokeless tobacco: Former Systems developer    Quit date: 02/12/2011  . Tobacco comment: Light use  Substance and Sexual Activity  . Alcohol use: Yes    Comment: occ  . Drug use: No  . Sexual activity: Yes  Lifestyle  . Physical activity:    Days per week: Not on file    Minutes per session: Not on file  . Stress: Not on file  Relationships  . Social connections:    Talks on phone: Not on file    Gets together: Not on file    Attends religious service: Not on file    Active member of club or  organization: Not on file    Attends meetings of clubs or organizations: Not on file    Relationship status: Not on file  . Intimate partner violence:    Fear of current or ex partner: Not on file    Emotionally abused: Not on file    Physically abused: Not on file    Forced sexual activity: Not on file  Other Topics Concern  . Not on file  Social History Narrative  . Not on file      Review of Systems  All other systems reviewed and are negative.      Objective:   Physical Exam  Constitutional: He is oriented to person, place, and time. He appears well-developed and well-nourished. No distress.  HENT:  Right Ear: External ear normal.  Left Ear: External ear normal.  Nose: Nose normal.  Mouth/Throat: Oropharynx is clear and moist. No oropharyngeal exudate.  Eyes: Pupils are equal, round, and reactive to light. Conjunctivae and EOM are normal. Right eye exhibits no discharge. Left eye exhibits no discharge. No scleral icterus.  Neck: Neck supple. No JVD present. No tracheal deviation present. No thyromegaly present.  Cardiovascular: Normal rate, regular rhythm and normal heart sounds. Exam reveals no gallop and no friction rub.  No murmur heard. Pulmonary/Chest: Effort normal and breath sounds normal. No stridor. No respiratory distress. He has no wheezes. He has no rales. He exhibits no tenderness.  Abdominal: Soft. Bowel sounds are normal. He exhibits no distension and no mass. There is no tenderness. There is no rebound and no guarding.  Musculoskeletal: He exhibits no edema.  Lymphadenopathy:    He has no cervical adenopathy.  Neurological: He is alert and oriented to person, place, and time. He has normal reflexes. No cranial nerve deficit. He exhibits normal muscle tone. Coordination normal.  Skin: No rash noted. He is not diaphoretic. No erythema. No pallor.  Psychiatric: He has a normal mood and affect. His speech is normal and behavior is normal. Judgment and thought  content normal. Cognition and memory are normal.  Vitals reviewed.         Assessment & Plan:  Chronic fatigue - Plan: Testosterone Total,Free,Bio, Males, CBC with Differential/Platelet, COMPLETE METABOLIC PANEL WITH GFR  Encounter for screening for HIV - Plan: HIV antibody (with reflex)  General medical exam  Physical exam today is completely normal.  Patient politely declines a tetanus shot.  I will check a CBC, CMP, and testosterone level given his fatigue and decreased libido.  He recently had a cholesterol panel obtained at work that showed an HDL cholesterol greater than 50, triglyceride level greater than 231, and normal total cholesterol.  Therefore he declines a cholesterol screen.  Spent 10-15 minutes discussing low carbohydrate low saturated fat diet.  Recheck fasting lipid panel in 6 months.  I will also screen the patient for HIV.

## 2018-04-15 ENCOUNTER — Encounter (INDEPENDENT_AMBULATORY_CARE_PROVIDER_SITE_OTHER): Payer: Self-pay

## 2018-04-15 LAB — CBC WITH DIFFERENTIAL/PLATELET
BASOS ABS: 48 {cells}/uL (ref 0–200)
Basophils Relative: 0.8 %
EOS ABS: 180 {cells}/uL (ref 15–500)
EOS PCT: 3 %
HEMATOCRIT: 47.4 % (ref 38.5–50.0)
HEMOGLOBIN: 16.3 g/dL (ref 13.2–17.1)
LYMPHS ABS: 1722 {cells}/uL (ref 850–3900)
MCH: 29.2 pg (ref 27.0–33.0)
MCHC: 34.4 g/dL (ref 32.0–36.0)
MCV: 84.8 fL (ref 80.0–100.0)
MPV: 9.7 fL (ref 7.5–12.5)
Monocytes Relative: 6.5 %
NEUTROS PCT: 61 %
Neutro Abs: 3660 cells/uL (ref 1500–7800)
Platelets: 350 10*3/uL (ref 140–400)
RBC: 5.59 10*6/uL (ref 4.20–5.80)
RDW: 12.2 % (ref 11.0–15.0)
Total Lymphocyte: 28.7 %
WBC mixed population: 390 cells/uL (ref 200–950)
WBC: 6 10*3/uL (ref 3.8–10.8)

## 2018-04-15 LAB — TESTOSTERONE TOTAL,FREE,BIO, MALES
ALBUMIN MSPROF: 4.7 g/dL (ref 3.6–5.1)
Sex Hormone Binding: 31 nmol/L (ref 10–50)
TESTOSTERONE BIOAVAILABLE: 117.1 ng/dL (ref >=110.0)
TESTOSTERONE: 402 ng/dL (ref 250–827)
Testosterone, Free: 54.7 pg/mL (ref 46.0–224.0)

## 2018-04-15 LAB — COMPLETE METABOLIC PANEL WITH GFR
AG RATIO: 2 (calc) (ref 1.0–2.5)
ALBUMIN MSPROF: 4.7 g/dL (ref 3.6–5.1)
ALKALINE PHOSPHATASE (APISO): 88 U/L (ref 40–115)
ALT: 31 U/L (ref 9–46)
AST: 24 U/L (ref 10–40)
BUN: 18 mg/dL (ref 7–25)
CO2: 28 mmol/L (ref 20–32)
Calcium: 9.7 mg/dL (ref 8.6–10.3)
Chloride: 103 mmol/L (ref 98–110)
Creat: 0.97 mg/dL (ref 0.60–1.35)
GFR, Est African American: 118 mL/min/{1.73_m2} (ref >=60)
GFR, Est Non African American: 102 mL/min/{1.73_m2} (ref >=60)
GLOBULIN: 2.4 g/dL (ref 1.9–3.7)
Glucose, Bld: 61 mg/dL — ABNORMAL LOW (ref 65–99)
POTASSIUM: 4.4 mmol/L (ref 3.5–5.3)
SODIUM: 141 mmol/L (ref 135–146)
Total Bilirubin: 1.2 mg/dL (ref 0.2–1.2)
Total Protein: 7.1 g/dL (ref 6.1–8.1)

## 2018-04-15 LAB — HIV ANTIBODY (ROUTINE TESTING W REFLEX): HIV 1&2 Ab, 4th Generation: NONREACTIVE

## 2018-07-10 ENCOUNTER — Encounter: Payer: Self-pay | Admitting: Physician Assistant

## 2018-07-10 ENCOUNTER — Ambulatory Visit (INDEPENDENT_AMBULATORY_CARE_PROVIDER_SITE_OTHER): Payer: 59 | Admitting: Physician Assistant

## 2018-07-10 VITALS — BP 110/86 | HR 84 | Temp 97.9°F | Resp 16 | Ht 69.0 in | Wt 168.2 lb

## 2018-07-10 DIAGNOSIS — J029 Acute pharyngitis, unspecified: Secondary | ICD-10-CM

## 2018-07-10 DIAGNOSIS — J988 Other specified respiratory disorders: Secondary | ICD-10-CM

## 2018-07-10 DIAGNOSIS — B9689 Other specified bacterial agents as the cause of diseases classified elsewhere: Secondary | ICD-10-CM

## 2018-07-10 MED ORDER — AMOXICILLIN 875 MG PO TABS: 875.0000 mg | ORAL_TABLET | Freq: Two times a day (BID) | ORAL | 0 refills | Status: DC

## 2018-07-10 MED ORDER — CYCLOBENZAPRINE HCL 10 MG PO TABS: 10.0000 mg | ORAL_TABLET | Freq: Three times a day (TID) | ORAL | 1 refills | Status: DC | PRN

## 2018-07-10 NOTE — Progress Notes (Signed)
    Patient ID: Kent Hernandez MRN: [id number], DOB: [birth year], 32 y.o. Date of Encounter: 07/10/2018, 2:41 PM    Chief Complaint:  Chief Complaint  Patient presents with  . Headache  . Sore Throat  . ears popping  . refill on medication     HPI: 32 y.o. year old male presents with above.   Reports that his head feels all stopped up and congested.  Causing a headache.  Did have some sore throat but that is getting better.  Ears feel very stopped up and popping at times.  Had some cough but does not feel like he has much ruled chest congestion feels like it is all in his head.  No known fevers or chills.  No other complaints to address today.     Home Meds:   Outpatient Medications Prior to Visit  Medication Sig Dispense Refill  . cetirizine (ZYRTEC) 10 MG tablet Take 10 mg by mouth 2 (two) times daily.      . fluticasone (FLONASE SENSIMIST) 27.5 MCG/SPRAY nasal spray Place 2 sprays into the nose daily. OTC    . lansoprazole (PREVACID) 30 MG capsule Take 1 capsule (30 mg total) by mouth daily at 12 noon. 30 capsule 3  . cyclobenzaprine (FLEXERIL) 10 MG tablet Take 1 tablet (10 mg total) by mouth 3 (three) times daily as needed for muscle spasms. 30 tablet 0  . clonazePAM (KLONOPIN) 0.5 MG tablet Take 0.5 tablets (0.25 mg total) by mouth 3 (three) times daily as needed for anxiety. (Patient not taking: Reported on 07/10/2018) 20 tablet 0  . escitalopram (LEXAPRO) 10 MG tablet Take 1 tablet (10 mg total) by mouth daily. (Patient not taking: Reported on 07/10/2018) 90 tablet 3   No facility-administered medications prior to visit.     Allergies: No Known Allergies    Review of Systems: See HPI for pertinent ROS. All other ROS negative.    Physical Exam: Blood pressure 110/86, pulse 84, temperature 97.9 F (36.6 C), temperature source Oral, resp. rate 16, height 5\' 9"  (1.753 m), weight 76.3 kg, SpO2 97 %., Body mass index is 24.84 kg/m. General:  WNWD WM. Appears in no acute  distress. HEENT: Normocephalic, atraumatic, eyes without discharge, sclera non-icteric, nares are without discharge. Bilateral auditory canals clear, Left TM is dull. Right TM is clear, normal.   Oral cavity moist, posterior pharynx without exudate, erythema, peritonsillar abscess, or post nasal drip.  Neck: Supple. No thyromegaly. No lymphadenopathy. Lungs: Clear bilaterally to auscultation without wheezes, rales, or rhonchi. Breathing is unlabored. Heart: Regular rhythm. No murmurs, rubs, or gallops. Msk:  Strength and tone normal for age. Extremities/Skin: Warm and dry.  Neuro: Alert and oriented X 3. Moves all extremities spontaneously. Gait is normal. CNII-XII grossly in tact. Psych:  Responds to questions appropriately with a normal affect.     ASSESSMENT AND PLAN:  32 y.o. year old male with   1. Bacterial respiratory infection He is to take amoxicillin as directed.  Also can use over-the-counter decongestants and other meds for symptom relief in the interim.  Follow-up if symptoms do not resolve upon completion of antibiotic. - amoxicillin (AMOXIL) 875 MG tablet; Take 1 tablet (875 mg total) by mouth 2 (two) times daily.  Dispense: 14 tablet; Refill: 0  2. Sore throat - STREP GROUP A AG, W/REFLEX TO CULT   Signed, [address] [place], [place], [facility] 07/10/2018 2:41 PM

## 2018-07-12 LAB — CULTURE, GROUP A STREP
MICRO NUMBER:: 91029550
SPECIMEN QUALITY:: ADEQUATE

## 2018-07-12 LAB — STREP GROUP A AG, W/REFLEX TO CULT: Streptococcus, Group A Screen (Direct): NOT DETECTED

## 2018-09-17 ENCOUNTER — Ambulatory Visit (INDEPENDENT_AMBULATORY_CARE_PROVIDER_SITE_OTHER): Payer: 59 | Admitting: Family Medicine

## 2018-09-17 ENCOUNTER — Encounter: Payer: Self-pay | Admitting: Family Medicine

## 2018-09-17 VITALS — BP 136/90 | HR 90 | Temp 98.4°F | Resp 14 | Ht 69.0 in | Wt 173.0 lb

## 2018-09-17 DIAGNOSIS — H6983 Other specified disorders of Eustachian tube, bilateral: Secondary | ICD-10-CM

## 2018-09-17 MED ORDER — AMOXICILLIN 875 MG PO TABS: 875.0000 mg | ORAL_TABLET | Freq: Two times a day (BID) | ORAL | 0 refills | Status: DC

## 2018-09-17 MED ORDER — PREDNISONE 20 MG PO TABS
ORAL_TABLET | ORAL | 0 refills | Status: DC
Start: 2018-09-17 — End: 2019-01-16

## 2018-09-17 NOTE — Progress Notes (Signed)
Subjective:    Patient ID: Kent Hernandez, male    DOB: [birth year], 31 y.o.   MRN: [id number]  HPI  Patient states that for the last 2 months, he reports pain and pressure in both ears.  He states that his ears feel stopped up like there is water stuck behind them.  He states that he feels or hears popping in his ears whenever he chews or whenever he swallows.  He reports mild hearing loss in both ears.  He reports pressure in both ears similar to the pressure one feels when they are flying in an airplane.  He has tried [facility] on a daily basis in addition to Zyrtec for congestion and sinusitis without any improvement.  He denies any vertigo.  He denies any unilateral hearing loss.  He denies any headaches or other neurologic deficits.  Over the last 3 days however he has developed a sore throat, cough, head congestion, and rhinorrhea.  His wife is having similar symptoms. Past Medical History:  Diagnosis Date  . Allergic rhinitis   . DDD (degenerative disc disease), cervical 2015  . Esophageal reflux   . H. pylori infection    No past surgical history on file. Current Outpatient Medications on File Prior to Visit  Medication Sig Dispense Refill  . cyclobenzaprine (FLEXERIL) 10 MG tablet Take 1 tablet (10 mg total) by mouth 3 (three) times daily as needed for muscle spasms. 30 tablet 1  . fluticasone (FLONASE SENSIMIST) 27.5 MCG/SPRAY nasal spray Place 2 sprays into the nose daily. OTC    . lansoprazole (PREVACID) 30 MG capsule Take 1 capsule (30 mg total) by mouth daily at 12 noon. 30 capsule 3   No current facility-administered medications on file prior to visit.    No Known Allergies Social History   Socioeconomic History  . Marital status: Married    Spouse name: Not on file  . Number of children: Not on file  . Years of education: Not on file  . Highest education level: Not on file  Occupational History  . 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 Financial resource strain: Not on file  . Food insecurity:    Worry: Not on file    Inability: Not on file  . Transportation needs:    Medical: Not on file    Non-medical: Not on file  Tobacco Use  . Smoking status: Former Smoker    Types: Cigarettes  . Smokeless tobacco: Former Systems developer    Quit date: 02/12/2011  . Tobacco comment: Light use  Substance and Sexual Activity  . Alcohol use: Yes    Comment: occ  . Drug use: No  . Sexual activity: Yes  Lifestyle  . Physical activity:    Days per week: Not on file    Minutes per session: Not on file  . Stress: Not on file  Relationships  . Social connections:    Talks on phone: Not on file    Gets together: Not on file    Attends religious service: Not on file    Active member of club or organization: Not on file    Attends meetings of clubs or organizations: Not on file    Relationship status: Not on file  . Intimate partner violence:    Fear of current or ex partner: Not on file    Emotionally abused: Not on file    Physically abused: Not on file  Forced sexual activity: Not on file  Other Topics Concern  . Not on file  Social History Narrative  . Not on file      Review of Systems  All other systems reviewed and are negative.      Objective:   Physical Exam  Constitutional: He is oriented to person, place, and time. He appears well-developed and well-nourished. No distress.  HENT:  Right Ear: External ear normal.  Left Ear: External ear normal.  Nose: Nose normal.  Mouth/Throat: Oropharynx is clear and moist. No oropharyngeal exudate.  Eyes: Pupils are equal, round, and reactive to light. Conjunctivae and EOM are normal. Right eye exhibits no discharge. Left eye exhibits no discharge. No scleral icterus.  Neck: Neck supple. No JVD present. No tracheal deviation present. No thyromegaly present.  Cardiovascular: Normal rate, regular rhythm and normal heart sounds. Exam reveals no gallop and no friction rub.  No  murmur heard. Pulmonary/Chest: Effort normal and breath sounds normal. No stridor. No respiratory distress. He has no wheezes. He has no rales. He exhibits no tenderness.  Abdominal: Soft. Bowel sounds are normal. He exhibits no distension and no mass. There is no tenderness. There is no rebound and no guarding.  Musculoskeletal: He exhibits no edema.  Lymphadenopathy:    He has no cervical adenopathy.  Neurological: He is alert and oriented to person, place, and time. He has normal reflexes. No cranial nerve deficit. He exhibits normal muscle tone. Coordination normal.  Skin: No rash noted. He is not diaphoretic. No erythema. No pallor.  Psychiatric: He has a normal mood and affect. His speech is normal and behavior is normal. Judgment and thought content normal. Cognition and memory are normal.  Vitals reviewed.         Assessment & Plan:  Eustachian tube dysfunction, bilateral - Plan: Ambulatory referral to ENT  Physical exam today is normal outside of some mild congestion.  I believe the patient has chronic eustachian tube dysfunction coupled with a recent viral upper respiratory infection.  I will treat the patient for possible chronic sinusitis with a prednisone taper pack coupled with amoxicillin 875 mg p.o. twice daily for 10 days in case chronic sinusitis is contributing to his eustachian tube dysfunction.  However if symptoms are not improving, I would recommend ENT consultation to discuss possible tympanostomy tube placement.  I see no significant abnormality on today's exam to explain his symptoms aside from some mild rhinorrhea and he is failed outpatient therapy with Zyrtec and Flonase.

## 2019-01-05 ENCOUNTER — Encounter: Payer: Self-pay | Admitting: Family Medicine

## 2019-01-05 DIAGNOSIS — H6983 Other specified disorders of Eustachian tube, bilateral: Secondary | ICD-10-CM

## 2019-01-10 ENCOUNTER — Ambulatory Visit (INDEPENDENT_AMBULATORY_CARE_PROVIDER_SITE_OTHER): Payer: 59 | Admitting: *Deleted

## 2019-01-10 DIAGNOSIS — Z23 Encounter for immunization: Secondary | ICD-10-CM | POA: Diagnosis not present

## 2019-01-16 ENCOUNTER — Ambulatory Visit: Payer: 59 | Admitting: Family Medicine

## 2019-01-16 ENCOUNTER — Encounter: Payer: Self-pay | Admitting: Family Medicine

## 2019-01-16 VITALS — BP 110/78 | HR 92 | Temp 98.2°F | Resp 16 | Ht 69.0 in | Wt 169.0 lb

## 2019-01-16 DIAGNOSIS — R002 Palpitations: Secondary | ICD-10-CM

## 2019-01-16 MED ORDER — METOPROLOL SUCCINATE ER 25 MG PO TB24: 25.0000 mg | ORAL_TABLET | Freq: Every day | ORAL | 3 refills | Status: DC

## 2019-01-16 NOTE — Progress Notes (Signed)
Subjective:    Patient ID: Kent Hernandez, male    DOB: 1986/03/19, 33 y.o.   MRN: [id number]  HPI  Over the last few months, the patient has noticed increasing palpitations.  He states that they typically occur after he eats lunch.  They can last for a few minutes.  Most often they are skipped beats followed by normal beats that will occur repetitively over a few minutes and then spontaneously extinguish.  There is no chest pain or shortness of breath or syncope or near syncope associated with it.  Patient is not on caffeine.  He is not on any type of stimulant.  He denies any relationship to stress or insomnia.  There are no specific triggers.  There is no family history of any cardiac arrhythmias.  They are becoming more frequent. Past Medical History:  Diagnosis Date  . Allergic rhinitis   . DDD (degenerative disc disease), cervical 2015  . Esophageal reflux   . H. pylori infection    No past surgical history on file. Current Outpatient Medications on File Prior to Visit  Medication Sig Dispense Refill  . cyclobenzaprine (FLEXERIL) 10 MG tablet Take 1 tablet (10 mg total) by mouth 3 (three) times daily as needed for muscle spasms. 30 tablet 1  . fluticasone (FLONASE SENSIMIST) 27.5 MCG/SPRAY nasal spray Place 2 sprays into the nose daily. OTC     No current facility-administered medications on file prior to visit.    No Known Allergies Social History   Socioeconomic History  . Marital status: Married    Spouse name: Not on file  . Number of children: Not on file  . Years of education: Not on file  . Highest education level: Not on file  Occupational History  . 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 Financial resource strain: Not on file  . Food insecurity:    Worry: Not on file    Inability: Not on file  . Transportation needs:    Medical: Not on file    Non-medical: Not on file  Tobacco Use  . Smoking status: Former Smoker    Types:  Cigarettes  . Smokeless tobacco: Former Systems developer    Quit date: 02/12/2011  . Tobacco comment: Light use  Substance and Sexual Activity  . Alcohol use: Yes    Comment: occ  . Drug use: No  . Sexual activity: Yes  Lifestyle  . Physical activity:    Days per week: Not on file    Minutes per session: Not on file  . Stress: Not on file  Relationships  . Social connections:    Talks on phone: Not on file    Gets together: Not on file    Attends religious service: Not on file    Active member of club or organization: Not on file    Attends meetings of clubs or organizations: Not on file    Relationship status: Not on file  . Intimate partner violence:    Fear of current or ex partner: Not on file    Emotionally abused: Not on file    Physically abused: Not on file    Forced sexual activity: Not on file  Other Topics Concern  . Not on file  Social History Narrative  . Not on file      Review of Systems  Neurological: Positive for headaches.  All other systems reviewed and are negative.      Objective:  Physical Exam  Constitutional: He appears well-developed and well-nourished. No distress.  Neck: Neck supple. No JVD present. No tracheal deviation present. No thyromegaly present.  Cardiovascular: Normal rate, regular rhythm and normal heart sounds. Exam reveals no gallop and no friction rub.  No murmur heard. Pulmonary/Chest: Effort normal and breath sounds normal. No stridor. No respiratory distress. He has no wheezes. He has no rales. He exhibits no tenderness.  Lymphadenopathy:    He has no cervical adenopathy.  Skin: He is not diaphoretic. No pallor.  Vitals reviewed. EKG shows normal sinus rhythm with normal intervals and a normal axis with no evidence of ischemia or infarction        Assessment & Plan:  Palpitations - Plan: EKG 12-Lead  Physical exam today is completely normal.  EKG is completely normal.  I suspect that the patient is experiencing PVCs although I  cannot rule out SVT.  I recommended checking a CBC, CMP, TSH and empirically trying the patient on Toprol-XL 25 mg a day.  If the palpitations continue or worsen, I would recommend an event monitor

## 2019-01-17 ENCOUNTER — Encounter: Payer: Self-pay | Admitting: Family Medicine

## 2019-01-17 LAB — CBC WITH DIFFERENTIAL/PLATELET
Absolute Monocytes: 713 cells/uL (ref 200–950)
Basophils Absolute: 50 cells/uL (ref 0–200)
Basophils Relative: 0.7 %
EOS PCT: 3.8 %
Eosinophils Absolute: 274 cells/uL (ref 15–500)
HEMATOCRIT: 48.8 % (ref 38.5–50.0)
Hemoglobin: 16.9 g/dL (ref 13.2–17.1)
LYMPHS ABS: 2016 {cells}/uL (ref 850–3900)
MCH: 29.7 pg (ref 27.0–33.0)
MCHC: 34.6 g/dL (ref 32.0–36.0)
MCV: 85.8 fL (ref 80.0–100.0)
MPV: 9.9 fL (ref 7.5–12.5)
Monocytes Relative: 9.9 %
NEUTROS PCT: 57.6 %
Neutro Abs: 4147 cells/uL (ref 1500–7800)
PLATELETS: 345 10*3/uL (ref 140–400)
RBC: 5.69 10*6/uL (ref 4.20–5.80)
RDW: 12.3 % (ref 11.0–15.0)
TOTAL LYMPHOCYTE: 28 %
WBC: 7.2 10*3/uL (ref 3.8–10.8)

## 2019-01-17 LAB — COMPLETE METABOLIC PANEL WITH GFR
AG Ratio: 1.9 (calc) (ref 1.0–2.5)
ALKALINE PHOSPHATASE (APISO): 83 U/L (ref 36–130)
ALT: 28 U/L (ref 9–46)
AST: 22 U/L (ref 10–40)
Albumin: 4.5 g/dL (ref 3.6–5.1)
BILIRUBIN TOTAL: 1.1 mg/dL (ref 0.2–1.2)
BUN: 16 mg/dL (ref 7–25)
CHLORIDE: 103 mmol/L (ref 98–110)
CO2: 27 mmol/L (ref 20–32)
Calcium: 9.5 mg/dL (ref 8.6–10.3)
Creat: 1 mg/dL (ref 0.60–1.35)
GFR, EST AFRICAN AMERICAN: 113 mL/min/{1.73_m2} (ref >=60)
GFR, Est Non African American: 98 mL/min/{1.73_m2} (ref >=60)
GLUCOSE: 97 mg/dL (ref 65–99)
Globulin: 2.4 g/dL (calc) (ref 1.9–3.7)
POTASSIUM: 3.8 mmol/L (ref 3.5–5.3)
Sodium: 140 mmol/L (ref 135–146)
TOTAL PROTEIN: 6.9 g/dL (ref 6.1–8.1)

## 2019-01-17 LAB — TSH: TSH: 2.5 mIU/L (ref 0.40–4.50)

## 2019-01-22 ENCOUNTER — Telehealth: Payer: Self-pay | Admitting: Family Medicine

## 2019-01-22 DIAGNOSIS — J342 Deviated nasal septum: Secondary | ICD-10-CM | POA: Insufficient documentation

## 2019-01-22 DIAGNOSIS — H9313 Tinnitus, bilateral: Secondary | ICD-10-CM | POA: Insufficient documentation

## 2019-01-22 DIAGNOSIS — M26623 Arthralgia of bilateral temporomandibular joint: Secondary | ICD-10-CM | POA: Insufficient documentation

## 2019-01-22 DIAGNOSIS — H6990 Unspecified Eustachian tube disorder, unspecified ear: Secondary | ICD-10-CM | POA: Insufficient documentation

## 2019-01-22 NOTE — Telephone Encounter (Signed)
Pt has been exposed to influenza type a.  He is now having sx sore throat, body aches, headache.   Please call tamiflu in to [facility].

## 2019-01-22 NOTE — Telephone Encounter (Signed)
Ok to send Tamiflu?

## 2019-01-23 ENCOUNTER — Encounter: Payer: Self-pay | Admitting: Family Medicine

## 2019-01-23 ENCOUNTER — Other Ambulatory Visit: Payer: Self-pay | Admitting: Family Medicine

## 2019-01-23 MED ORDER — OSELTAMIVIR PHOSPHATE 75 MG PO CAPS: 75.0000 mg | ORAL_CAPSULE | Freq: Two times a day (BID) | ORAL | 0 refills | Status: DC

## 2019-01-23 NOTE — Addendum Note (Signed)
Addended by: Shary Decamp B on: 01/23/2019 10:39 AM   Modules accepted: Orders

## 2019-03-14 ENCOUNTER — Encounter: Payer: Self-pay | Admitting: Family Medicine

## 2019-05-12 ENCOUNTER — Encounter: Payer: Self-pay | Admitting: Family Medicine

## 2019-05-15 ENCOUNTER — Other Ambulatory Visit: Payer: Self-pay

## 2019-05-15 ENCOUNTER — Ambulatory Visit (INDEPENDENT_AMBULATORY_CARE_PROVIDER_SITE_OTHER): Payer: 59 | Admitting: Family Medicine

## 2019-05-15 ENCOUNTER — Telehealth: Payer: Self-pay | Admitting: *Deleted

## 2019-05-15 ENCOUNTER — Encounter: Payer: Self-pay | Admitting: Family Medicine

## 2019-05-15 DIAGNOSIS — Z20822 Contact with and (suspected) exposure to covid-19: Secondary | ICD-10-CM

## 2019-05-15 DIAGNOSIS — R05 Cough: Secondary | ICD-10-CM | POA: Diagnosis not present

## 2019-05-15 DIAGNOSIS — Z20828 Contact with and (suspected) exposure to other viral communicable diseases: Secondary | ICD-10-CM

## 2019-05-15 DIAGNOSIS — R053 Chronic cough: Secondary | ICD-10-CM

## 2019-05-15 NOTE — Telephone Encounter (Signed)
Spoke with patient, scheduled him for COVID 19 test on 05/19/2019 at 8 am at [facility].  Testing protocol reviewed with patient.

## 2019-05-15 NOTE — Progress Notes (Signed)
Virtual Visit via Telephone Note  I connected with Kent Hernandez on 05/15/19 at 12:23  by telephone and verified that I am speaking with the correct person using two identifiers.    Pt location: at home   Physician location:  In office, [facility], Vic Blackbird MD     On call: patient and physician     I discussed the limitations, risks, security and privacy concerns of performing an evaluation and management service by telephone and the availability of in person appointments. I also discussed with the patient that there may be a patient responsible charge related to this service. The patient expressed understanding and agreed to proceed.   History of Present Illness:  Needs Swab for COVID  He has cough for 2 months, he does occ get drainage, scractchy throat, occ sinus pressure/ pops in ear   went to [place] 2 weeks   Xyzal daily and nasal spray as needed No fever No Asthma/ no COPD  Non smoker  Works for [employer] not aware of any recent outbreaks   He has some reflux- taking omeprazole    Observations/Objective:  NAD on phone, normal WOB noted, no wheezing heard   Assessment and Plan: Chronic cough- DX Chronic allergies with post nasal drip, GERD, does have exposure with his job for [employer] but not classic symptoms no fever  will go ahead and swab so job will have he is not contaigous  Pt voiced understanding about quarentine until results   continue reflux meds for that component and allergy meds  May need to see pulmonary/ CXR pending outcomes   Follow Up Instructions:    I discussed the assessment and treatment plan with the patient. The patient was provided an opportunity to ask questions and all were answered. The patient agreed with the plan and demonstrated an understanding of the instructions.   The patient was advised to call back or seek an in-person evaluation if the symptoms worsen or if the condition fails to improve as anticipated.  I  provided  90minutes of non-face-to-face time during this encounter. End Time  12:30pm   Vic Blackbird, MD

## 2019-05-15 NOTE — Telephone Encounter (Signed)
-----   Message from Alycia Rossetti, MD sent at 05/15/2019 12:29 PM EDT ----- Regarding: COVID testing   Pt with cough ,sore throat  Works in [employer]

## 2019-05-16 ENCOUNTER — Encounter: Payer: Self-pay | Admitting: Family Medicine

## 2019-05-19 ENCOUNTER — Other Ambulatory Visit: Payer: Managed Care, Other (non HMO)

## 2019-05-19 DIAGNOSIS — Z20822 Contact with and (suspected) exposure to covid-19: Secondary | ICD-10-CM

## 2019-05-24 LAB — NOVEL CORONAVIRUS, NAA: SARS-CoV-2, NAA: NOT DETECTED

## 2019-06-23 ENCOUNTER — Encounter: Payer: Self-pay | Admitting: Family Medicine

## 2019-06-24 MED ORDER — METOPROLOL SUCCINATE ER 25 MG PO TB24: 25.0000 mg | ORAL_TABLET | Freq: Every day | ORAL | 3 refills | Status: DC

## 2019-06-24 MED ORDER — FIRST-DUKES MOUTHWASH MT SUSP: OROMUCOSAL | 1 refills | Status: DC

## 2019-06-25 ENCOUNTER — Other Ambulatory Visit: Payer: Self-pay

## 2019-06-25 MED ORDER — FIRST-DUKES MOUTHWASH MT SUSP: OROMUCOSAL | 1 refills | Status: DC

## 2019-08-05 ENCOUNTER — Encounter: Payer: Self-pay | Admitting: Family Medicine

## 2019-08-05 MED ORDER — CYCLOBENZAPRINE HCL 10 MG PO TABS: 10.0000 mg | ORAL_TABLET | Freq: Three times a day (TID) | ORAL | 1 refills | Status: DC | PRN

## 2019-08-05 NOTE — Telephone Encounter (Signed)
Requesting refill    Flexeril   LOV: 01/16/19  LRF:  07/11/19

## 2019-08-19 ENCOUNTER — Encounter: Payer: Self-pay | Admitting: Family Medicine

## 2019-08-19 MED ORDER — CYCLOBENZAPRINE HCL 10 MG PO TABS: 10.0000 mg | ORAL_TABLET | Freq: Three times a day (TID) | ORAL | 1 refills | Status: DC | PRN

## 2019-08-19 NOTE — Telephone Encounter (Signed)
Ok to send again?

## 2019-09-17 ENCOUNTER — Other Ambulatory Visit: Payer: Self-pay | Admitting: Family Medicine

## 2019-10-21 ENCOUNTER — Encounter: Payer: Self-pay | Admitting: Family Medicine

## 2019-10-21 ENCOUNTER — Ambulatory Visit (INDEPENDENT_AMBULATORY_CARE_PROVIDER_SITE_OTHER): Payer: 59 | Admitting: Family Medicine

## 2019-10-21 ENCOUNTER — Other Ambulatory Visit: Payer: Self-pay

## 2019-10-21 DIAGNOSIS — R05 Cough: Secondary | ICD-10-CM

## 2019-10-21 DIAGNOSIS — R053 Chronic cough: Secondary | ICD-10-CM

## 2019-10-21 MED ORDER — PANTOPRAZOLE SODIUM 40 MG PO TBEC: 40.0000 mg | DELAYED_RELEASE_TABLET | Freq: Every day | ORAL | 3 refills | Status: DC

## 2019-10-21 MED ORDER — METOPROLOL SUCCINATE ER 25 MG PO TB24: 25.0000 mg | ORAL_TABLET | Freq: Every day | ORAL | 1 refills | Status: DC

## 2019-10-21 NOTE — Progress Notes (Signed)
Subjective:    Patient ID: Kent Hernandez, male    DOB: 08/05/1986, 33 y.o.   MRN: [id number]  HPI  Patient is being seen today as a telephone visit.  Phone call began at 11:00.  Phone call concluded at 1114.  Patient states that he tested negative for both flu and COVID-19 on Friday.  He states that ever since March he has had a dry nonproductive cough.  Therefore he has had this for almost 9 months.  He denies any hemoptysis.  He denies any fever.  He denies any night sweats.  He denies any allergies or postnasal drip.  He denies any wheezing or history of asthma.  He does have a history of GERD.  He has been taking over-the-counter Prevacid. Past Medical History:  Diagnosis Date  . Allergic rhinitis   . DDD (degenerative disc disease), cervical 2015  . Esophageal reflux   . H. pylori infection    No past surgical history on file. Current Outpatient Medications on File Prior to Visit  Medication Sig Dispense Refill  . cyclobenzaprine (FLEXERIL) 10 MG tablet Take 1 tablet (10 mg total) by mouth 3 (three) times daily as needed for muscle spasms. 30 tablet 1  . Diphenhyd-Hydrocort-Nystatin (FIRST-DUKES MOUTHWASH) SUSP Swish and spit 55ml QID PRN for mouth ulcers 120 mL 1  . fluticasone (FLONASE SENSIMIST) 27.5 MCG/SPRAY nasal spray Place 2 sprays into the nose daily. OTC     No current facility-administered medications on file prior to visit.    No Known Allergies Social History   Socioeconomic History  . Marital status: Married    Spouse name: Not on file  . Number of children: Not on file  . Years of education: Not on file  . Highest education level: Not on file  Occupational History  . 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 Financial resource strain: Not on file  . Food insecurity    Worry: Not on file    Inability: Not on file  . Transportation needs    Medical: Not on file    Non-medical: Not on file  Tobacco Use  . Smoking status:  Former Smoker    Types: Cigarettes  . Smokeless tobacco: Former Systems developer    Quit date: 02/12/2011  . Tobacco comment: Light use  Substance and Sexual Activity  . Alcohol use: Yes    Comment: occ  . Drug use: No  . Sexual activity: Yes  Lifestyle  . Physical activity    Days per week: Not on file    Minutes per session: Not on file  . Stress: Not on file  Relationships  . Social Herbalist on phone: Not on file    Gets together: Not on file    Attends religious service: Not on file    Active member of club or organization: Not on file    Attends meetings of clubs or organizations: Not on file    Relationship status: Not on file  . Intimate partner violence    Fear of current or ex partner: Not on file    Emotionally abused: Not on file    Physically abused: Not on file    Forced sexual activity: Not on file  Other Topics Concern  . Not on file  Social History Narrative  . Not on file     Review of Systems  All other systems reviewed and are negative.  Objective:   Physical Exam        Assessment & Plan:  Chronic cough  Most likely the patient has laryngoesophageal reflux.  I recommended switching to Protonix 40 mg p.o. every morning with breakfast.  I recommended elevating the head of his bed 1-1/2 to 2 inches so that he is sleeping on an incline and then allowing 2 to 3 weeks to see if symptoms would improve however I suspect that his cough is related to acid reflux.  If not improving at that time I would recommend pulmonary function test and chest x-ray.

## 2019-11-24 ENCOUNTER — Encounter: Payer: Self-pay | Admitting: Family Medicine

## 2020-01-13 ENCOUNTER — Other Ambulatory Visit: Payer: Self-pay | Admitting: Family Medicine

## 2020-02-05 ENCOUNTER — Other Ambulatory Visit: Payer: Self-pay

## 2020-02-05 ENCOUNTER — Encounter: Payer: Self-pay | Admitting: Family Medicine

## 2020-02-05 ENCOUNTER — Ambulatory Visit: Payer: 59 | Admitting: Family Medicine

## 2020-02-05 VITALS — BP 126/94 | HR 90 | Temp 97.1°F | Resp 16 | Ht 69.0 in | Wt 173.0 lb

## 2020-02-05 DIAGNOSIS — M542 Cervicalgia: Secondary | ICD-10-CM | POA: Diagnosis not present

## 2020-02-05 NOTE — Progress Notes (Signed)
Subjective:    Patient ID: Kent Hernandez, male    DOB: [birth year], 34 y.o.   MRN: [id number]  Patient presents today with a 1 week to 2-week history of pain in the right side of his neck.  The pain is located anatomically following the path of the right sternocleidomastoid muscle.  The pain radiates from his clavicle up to behind his ear and down towards his sternum.  He states that it aches at night while he is trying to sleep.  He denies any pain with rotation of his head left and right today.  I am unable to elicit any pain with palpation in the sternocleidomastoid muscle or in his trapezius on the right side.  He does occasionally get some numbness in his left arm however this is fleeting and comes and goes and is unrelated to the pain he is experiencing right now.  He also reports that he occasionally gets some numbness and tingling on the surface of his jaws.  He states the right side occurs more often than the left side and he is concerned because he used to dip and that is where he would hold his tobacco products was on the right side of his mouth in his right cheek.  Examination today shows no pathologic lesion on the buccal mucosa.  There is no palpable lymphadenopathy in the neck.  There is no swelling or mass in the right or left parotid gland.  I am unable to elicit any pain by percussion over the trigeminal nerve bilaterally.  The remainder of his facial exam is normal. Past Medical History:  Diagnosis Date  . Allergic rhinitis   . DDD (degenerative disc disease), cervical 2015  . Esophageal reflux   . H. pylori infection    No past surgical history on file. Current Outpatient Medications on File Prior to Visit  Medication Sig Dispense Refill  . cetirizine (ZYRTEC) 10 MG tablet Take 10 mg by mouth daily.    . cyclobenzaprine (FLEXERIL) 10 MG tablet Take 1 tablet (10 mg total) by mouth 3 (three) times daily as needed for muscle spasms. 30 tablet 1  . fluticasone (FLONASE SENSIMIST)  27.5 MCG/SPRAY nasal spray Place 2 sprays into the nose daily. OTC    . pantoprazole (PROTONIX) 40 MG tablet TAKE 1 TABLET BY MOUTH EVERY DAY 90 tablet 1   No current facility-administered medications on file prior to visit.   No Known Allergies Social History   Socioeconomic History  . Marital status: Married    Spouse name: Not on file  . Number of children: Not on file  . Years of education: Not on file  . Highest education level: Not on file  Occupational History  . 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 Smoking status: Former Smoker    Types: Cigarettes  . Smokeless tobacco: Former Systems developer    Quit date: 02/12/2011  . Tobacco comment: Light use  Substance and Sexual Activity  . Alcohol use: Yes    Comment: occ  . Drug use: No  . Sexual activity: Yes  Other Topics Concern  . Not on file  Social History Narrative  . Not on file   Social Determinants of Health   Financial Resource Strain:   . Difficulty of Paying Living Expenses:   Food Insecurity:   . Worried About Charity fundraiser in the Last Year:   . [place] in the Last Year:  Transportation Needs:   . Film/video editor (Medical):   Marland Kitchen Lack of Transportation (Non-Medical):   Physical Activity:   . Days of Exercise per Week:   . Minutes of Exercise per Session:   Stress:   . Feeling of Stress :   Social Connections:   . Frequency of Communication with Friends and Family:   . Frequency of Social Gatherings with Friends and Family:   . Attends Religious Services:   . Active Member of Clubs or Organizations:   . Attends Archivist Meetings:   Marland Kitchen Marital Status:   Intimate Partner Violence:   . Fear of Current or Ex-Partner:   . Emotionally Abused:   Marland Kitchen Physically Abused:   . Sexually Abused:       Review of Systems  Neurological: Positive for headaches.  All other systems reviewed and are negative.      Objective:   Physical Exam   Constitutional: He appears well-developed and well-nourished. No distress.  HENT:  Right Ear: External ear normal.  Left Ear: External ear normal.  Nose: Nose normal.  Mouth/Throat: Oropharynx is clear and moist. No oropharyngeal exudate.  Eyes: Conjunctivae are normal.  Neck: Trachea normal and phonation normal. Normal carotid pulses and no JVD present. Carotid bruit is not present. No tracheal deviation present. No thyroid mass and no thyromegaly present.    Cardiovascular: Normal rate, regular rhythm and normal heart sounds. Exam reveals no gallop and no friction rub.  No murmur heard. Pulmonary/Chest: Effort normal and breath sounds normal. No stridor. No respiratory distress. He has no wheezes. He has no rales. He exhibits no tenderness.  Musculoskeletal:     Cervical back: Full passive range of motion without pain, normal range of motion and neck supple. No rigidity. No spinous process tenderness or muscular tenderness.  Lymphadenopathy:    He has no cervical adenopathy.  Skin: He is not diaphoretic.  Vitals reviewed.       Assessment & Plan:  Neck pain I believe the patient most likely has a strain in the sternocleidomastoid muscle or trapezius causing muscle spasms at night while he is trying to sleep.  Therefore I recommended that he take 800 mg of ibuprofen in the morning to try to calm the inflammation in his neck during the day and then take Flexeril 10 mg at night prior to bed.  I believe if he does this for the next 7 days the pain will subside.  I can see no abnormalities on his exam today to explain the tingling over both mandibles.  I suspect that the straps of his facial mask may be putting slight pressure over the trigeminal nerve as this is exactly where he wears his face mask and that at times it may be causing some tingling due to compression.  I recommended that we simply monitor this and if symptoms worsen we can certainly pursue neuro imaging.  Patient was  concerned about possible cancer due to chewing tobacco and DIP however I see no abnormalities on his oropharyngeal exam

## 2020-04-02 ENCOUNTER — Encounter: Payer: Self-pay | Admitting: Family Medicine

## 2020-04-05 ENCOUNTER — Other Ambulatory Visit: Payer: Self-pay | Admitting: Family Medicine

## 2020-04-05 MED ORDER — METOPROLOL SUCCINATE ER 25 MG PO TB24: 25.0000 mg | ORAL_TABLET | Freq: Every day | ORAL | 3 refills | Status: DC

## 2020-04-11 ENCOUNTER — Emergency Department (HOSPITAL_COMMUNITY)
Admission: EM | Admit: 2020-04-11 | Discharge: 2020-04-11 | Disposition: A | Discharge status: Home / Self Care | Payer: No Typology Code available for payment source | Attending: Emergency Medicine | Admitting: Emergency Medicine

## 2020-04-11 ENCOUNTER — Other Ambulatory Visit: Payer: Self-pay

## 2020-04-11 ENCOUNTER — Emergency Department (HOSPITAL_COMMUNITY): Payer: No Typology Code available for payment source

## 2020-04-11 DIAGNOSIS — Z87891 Personal history of nicotine dependence: Secondary | ICD-10-CM | POA: Diagnosis not present

## 2020-04-11 DIAGNOSIS — R0789 Other chest pain: Secondary | ICD-10-CM | POA: Insufficient documentation

## 2020-04-11 DIAGNOSIS — R5383 Other fatigue: Secondary | ICD-10-CM | POA: Diagnosis not present

## 2020-04-11 DIAGNOSIS — Z20822 Contact with and (suspected) exposure to covid-19: Secondary | ICD-10-CM | POA: Insufficient documentation

## 2020-04-11 LAB — COMPREHENSIVE METABOLIC PANEL
ALT: 29 U/L (ref 0–44)
AST: 25 U/L (ref 15–41)
Albumin: 4.5 g/dL (ref 3.5–5.0)
Alkaline Phosphatase: 86 U/L (ref 38–126)
Anion gap: 10 (ref 5–15)
BUN: 19 mg/dL (ref 6–20)
CO2: 28 mmol/L (ref 22–32)
Calcium: 9.6 mg/dL (ref 8.9–10.3)
Chloride: 101 mmol/L (ref 98–111)
Creatinine, Ser: 1.11 mg/dL (ref 0.61–1.24)
GFR calc Af Amer: >60 mL/min (ref >60)
GFR calc non Af Amer: >60 mL/min (ref >60)
Glucose, Bld: 118 mg/dL — ABNORMAL HIGH (ref 70–99)
Potassium: 3.6 mmol/L (ref 3.5–5.1)
Sodium: 139 mmol/L (ref 135–145)
Total Bilirubin: 1.1 mg/dL (ref 0.3–1.2)
Total Protein: 7.8 g/dL (ref 6.5–8.1)

## 2020-04-11 LAB — CBC WITH DIFFERENTIAL/PLATELET
Abs Immature Granulocytes: 0.02 10*3/uL (ref 0.00–0.07)
Basophils Absolute: 0.1 10*3/uL (ref 0.0–0.1)
Basophils Relative: 1 %
Eosinophils Absolute: 0.3 10*3/uL (ref 0.0–0.5)
Eosinophils Relative: 4 %
HCT: 47 % (ref 39.0–52.0)
Hemoglobin: 16.2 g/dL (ref 13.0–17.0)
Immature Granulocytes: 0 %
Lymphocytes Relative: 36 %
Lymphs Abs: 2.9 10*3/uL (ref 0.7–4.0)
MCH: 30.2 pg (ref 26.0–34.0)
MCHC: 34.5 g/dL (ref 30.0–36.0)
MCV: 87.5 fL (ref 80.0–100.0)
Monocytes Absolute: 0.7 10*3/uL (ref 0.1–1.0)
Monocytes Relative: 8 %
Neutro Abs: 4.1 10*3/uL (ref 1.7–7.7)
Neutrophils Relative %: 51 %
Platelets: 316 10*3/uL (ref 150–400)
RBC: 5.37 MIL/uL (ref 4.22–5.81)
RDW: 12.4 % (ref 11.5–15.5)
WBC: 8 10*3/uL (ref 4.0–10.5)
nRBC: 0 % (ref 0.0–0.2)

## 2020-04-11 LAB — D-DIMER, QUANTITATIVE: D-Dimer, Quant: <0.27 ug/mL-FEU (ref 0.00–0.50)

## 2020-04-11 LAB — TROPONIN I (HIGH SENSITIVITY): Troponin I (High Sensitivity): 2 ng/L (ref <18)

## 2020-04-11 NOTE — Discharge Instructions (Addendum)
Ibuprofen 600 mg every 6 hours as needed for pain.  Follow-up with your primary doctor on Thursday as scheduled, and return to the ER if symptoms significantly worsen or change.

## 2020-04-11 NOTE — ED Triage Notes (Signed)
Pt states feeling unwell for the last couple weeks but today started getting chest pains and feeling short of breath  Picked up script for metoprolol today but hasn't started it yet.

## 2020-04-11 NOTE — ED Provider Notes (Signed)
[facility] EMERGENCY DEPARTMENT Provider Note   CSN: [id number] Arrival date & time: 04/11/20  2021     History Chief Complaint  Patient presents with  . Chest Pain    Kent Hernandez is a 34 y.o. male.  Patient is a 34 year old male with past medical history of reflux and degenerative disc disease.  He presents today for evaluation of fatigue and chest pain.  For the past 2 weeks he states he has felt somewhat "off".  He has been fatigued with little energy.  This morning he started having discomfort to the left side of his chest that is worse when he breathes and moves.  He does report some shortness of breath, but denies any fever or productive cough.  The history is provided by the patient.  Chest Pain Pain location:  L chest Pain quality: sharp   Pain radiates to:  Does not radiate Pain severity:  Moderate Onset quality:  Gradual Duration:  2 weeks Timing:  Intermittent Progression:  Worsening Chronicity:  New Relieved by:  Nothing Worsened by:  Nothing      Past Medical History:  Diagnosis Date  . Allergic rhinitis   . DDD (degenerative disc disease), cervical 2015  . Esophageal reflux   . H. pylori infection     Patient Active Problem List   Diagnosis Date Noted  . DDD (degenerative disc disease), cervical 08/09/2016  . Skin lesion 08/04/2013  . Genital warts   . Allergic rhinitis   . GERD (gastroesophageal reflux disease) 07/11/2011  . Dysphagia, unspecified(787.20) 06/12/2011  . Esophageal reflux 06/12/2011    No past surgical history on file.     Family History  Problem Relation Age of Onset  . Diabetes Maternal Uncle   . Diabetes Paternal Grandfather   . Heart disease Paternal Grandfather   . Hypertension Father   . Kidney disease Father   . Cancer Maternal Grandmother   . Heart disease Paternal Grandmother     Social History   Tobacco Use  . Smoking status: Former Smoker    Types: Cigarettes  . Smokeless tobacco: Former Systems developer   Quit date: 02/12/2011  . Tobacco comment: Light use  Substance Use Topics  . Alcohol use: Yes    Comment: occ  . Drug use: No    Home Medications Prior to Admission medications   Medication Sig Start Date End Date Taking? Authorizing Provider  cetirizine (ZYRTEC) 10 MG tablet Take 10 mg by mouth daily.    [provider]  cyclobenzaprine (FLEXERIL) 10 MG tablet Take 1 tablet (10 mg total) by mouth 3 (three) times daily as needed for muscle spasms. 08/19/19   Susy Frizzle, MD  fluticasone (FLONASE SENSIMIST) 27.5 MCG/SPRAY nasal spray Place 2 sprays into the nose daily. OTC    [provider]  metoprolol succinate (TOPROL-XL) 25 MG 24 hr tablet Take 1 tablet (25 mg total) by mouth daily. 04/05/20   Susy Frizzle, MD  pantoprazole (PROTONIX) 40 MG tablet TAKE 1 TABLET BY MOUTH EVERY DAY 01/13/20   Susy Frizzle, MD    Allergies    Patient has no known allergies.  Review of Systems   Review of Systems  Cardiovascular: Positive for chest pain.  All other systems reviewed and are negative.   Physical Exam Updated Vital Signs BP (!) 141/105   Pulse 78   Resp 12   Ht 5\' 9"  (1.753 m)   Wt 79.4 kg   SpO2 97%   BMI 25.84  kg/m   Physical Exam Vitals and nursing note reviewed.  Constitutional:      General: He is not in acute distress.    Appearance: He is well-developed. He is not diaphoretic.  HENT:     Head: Normocephalic and atraumatic.  Cardiovascular:     Rate and Rhythm: Normal rate and regular rhythm.     Heart sounds: No murmur. No friction rub.  Pulmonary:     Effort: Pulmonary effort is normal. No respiratory distress.     Breath sounds: Normal breath sounds. No wheezing or rales.  Abdominal:     General: Bowel sounds are normal. There is no distension.     Palpations: Abdomen is soft.     Tenderness: There is no abdominal tenderness.  Musculoskeletal:        General: Normal range of motion.     Cervical back: Normal range of motion and  neck supple.     Right lower leg: No tenderness. No edema.     Left lower leg: No tenderness. No edema.  Skin:    General: Skin is warm and dry.  Neurological:     Mental Status: He is alert and oriented to person, place, and time.     Coordination: Coordination normal.     ED Results / Procedures / Treatments   Labs (all labs ordered are listed, but only abnormal results are displayed) Labs Reviewed - No data to display  EKG EKG Interpretation  Date/Time:  Sunday Apr 11 2020 20:43:23 EDT Ventricular Rate:  71 PR Interval:    QRS Duration: 91 QT Interval:  384 QTC Calculation: 418 R Axis:   61 Text Interpretation: Sinus rhythm Normal ECG Confirmed by Veryl Speak [phone]) on 04/11/2020 10:16:31 PM   Radiology No results found.  Procedures Procedures (including critical care time)  Medications Ordered in ED Medications - No data to display  ED Course  I have reviewed the triage vital signs and the nursing notes.  Pertinent labs & imaging results that were available during my care of the patient were reviewed by me and considered in my medical decision making (see chart for details).    MDM Rules/Calculators/A&P  Patient presenting here with complaints of chest discomfort that has been occurring intermittently for the past several days.  I highly doubt a cardiac etiology.  Patient has no risk factors and his work-up today is unremarkable.  His troponin is negative and EKG is normal.  Chest x-ray is clear.  D-dimer is negative.  I suspect a musculoskeletal cause.  Patient also reports increased stress at work and this may also play a role.  Either way, I do feel as though emergent pathology has been ruled out.  He has an upcoming appointment on Thursday with his primary doctor and I have advised him to keep this appointment.  He is to return as needed if his symptoms worsen or change.  Final Clinical Impression(s) / ED Diagnoses Final diagnoses:  None    Rx / DC  Orders ED Discharge Orders    None       Veryl Speak, MD 04/11/20 2218

## 2020-04-12 LAB — SARS CORONAVIRUS 2 (TAT 6-24 HRS): SARS Coronavirus 2: NEGATIVE

## 2020-04-15 ENCOUNTER — Ambulatory Visit (INDEPENDENT_AMBULATORY_CARE_PROVIDER_SITE_OTHER): Payer: No Typology Code available for payment source | Admitting: Family Medicine

## 2020-04-15 ENCOUNTER — Encounter: Payer: 59 | Admitting: Family Medicine

## 2020-04-15 ENCOUNTER — Other Ambulatory Visit: Payer: Self-pay

## 2020-04-15 VITALS — BP 124/70 | HR 91 | Temp 97.0°F | Wt 178.0 lb

## 2020-04-15 DIAGNOSIS — Z0000 Encounter for general adult medical examination without abnormal findings: Secondary | ICD-10-CM

## 2020-04-15 MED ORDER — ESCITALOPRAM OXALATE 10 MG PO TABS
10.0000 mg | ORAL_TABLET | Freq: Every day | ORAL | 3 refills | Status: DC
Start: 2020-04-15 — End: 2020-05-20

## 2020-04-15 NOTE — Progress Notes (Signed)
Subjective:    Patient ID: Kent Hernandez, male    DOB: [birth year], 34 y.o.   MRN: [id number]  HPI  Patient is here today for a complete physical exam.  Patient was seen in the emergency room on May 30 for atypical chest pain.  EKG was normal.  Chest x-ray was normal.  D-dimer was negative.  We recently resigned metoprolol due to elevated blood pressure.  I suspect that the patient's anxiety has worsened recently.  Patient admits that he is under more stress at work.  There is also stress in his marriage.  His wife indicates that he is having difficulty managing his anger.  He denies any depression but he does feel anxious on a daily basis.  He feels more stressed out than usual.  He is looking forward to a vacation coming up.  He is having panic attacks at times.  He also reports generalized anxiety, consistent basis.  In the past I diagnosed him with generalized anxiety disorder however he is always been hesitant to take medication.  Past Medical History:  Diagnosis Date  . Allergic rhinitis   . DDD (degenerative disc disease), cervical 2015  . Esophageal reflux   . GERD (gastroesophageal reflux disease)    Phreesia 04/12/2020  . H. pylori infection   . Hyperlipidemia    Phreesia 04/12/2020  . Hypertension    Phreesia 04/12/2020   No past surgical history on file. Current Outpatient Medications on File Prior to Visit  Medication Sig Dispense Refill  . cetirizine (ZYRTEC) 10 MG tablet Take 10 mg by mouth daily.    . metoprolol succinate (TOPROL-XL) 25 MG 24 hr tablet Take 1 tablet (25 mg total) by mouth daily. 90 tablet 3  . pantoprazole (PROTONIX) 40 MG tablet TAKE 1 TABLET BY MOUTH EVERY DAY 90 tablet 1   No current facility-administered medications on file prior to visit.   No Known Allergies Social History   Socioeconomic History  . Marital status: Married    Spouse name: Not on file  . Number of children: Not on file  . Years of education: Not on file  . Highest  education level: Not on file  Occupational History  . Occupation: Dentist: [employer]  Tobacco Use  . Smoking status: Former Smoker    Types: Cigarettes  . Smokeless tobacco: Former Systems developer    Quit date: 02/12/2011  . Tobacco comment: Light use  Substance and Sexual Activity  . Alcohol use: Yes    Comment: occ  . Drug use: No  . Sexual activity: Yes  Other Topics Concern  . Not on file  Social History Narrative  . Not on file   Social Determinants of Health   Financial Resource Strain:   . Difficulty of Paying Living Expenses:   Food Insecurity:   . Worried About Charity fundraiser in the Last Year:   . Arboriculturist in the Last Year:   Transportation Needs:   . Film/video editor (Medical):   Marland Kitchen Lack of Transportation (Non-Medical):   Physical Activity:   . Days of Exercise per Week:   . Minutes of Exercise per Session:   Stress:   . Feeling of Stress :   Social Connections:   . Frequency of Communication with Friends and Family:   . Frequency of Social Gatherings with Friends and Family:   . Attends Religious Services:   . Active Member of Clubs or Organizations:   .  Attends Archivist Meetings:   Marland Kitchen Marital Status:   Intimate Partner Violence:   . Fear of Current or Ex-Partner:   . Emotionally Abused:   Marland Kitchen Physically Abused:   . Sexually Abused:    Family History  Problem Relation Age of Onset  . Diabetes Maternal Uncle   . Diabetes Paternal Grandfather   . Heart disease Paternal Grandfather   . Hypertension Father   . Kidney disease Father   . Cancer Maternal Grandmother   . Heart disease Paternal Grandmother   '   Review of Systems  All other systems reviewed and are negative.      Objective:   Physical Exam  Constitutional: He is oriented to person, place, and time. He appears well-developed and well-nourished. No distress.  HENT:  Head: Normocephalic and atraumatic.  Right Ear: External ear normal.  Left  Ear: External ear normal.  Nose: Nose normal.  Mouth/Throat: Oropharynx is clear and moist. No oropharyngeal exudate.  Eyes: Pupils are equal, round, and reactive to light. Conjunctivae and EOM are normal. Right eye exhibits no discharge. Left eye exhibits no discharge. No scleral icterus.  Neck: No JVD present. No tracheal deviation present. No thyromegaly present.  Cardiovascular: Normal rate, regular rhythm, normal heart sounds and intact distal pulses. Exam reveals no gallop and no friction rub.  No murmur heard. Pulmonary/Chest: Effort normal and breath sounds normal. No stridor. No respiratory distress. He has no wheezes. He has no rales. He exhibits no tenderness.  Abdominal: Soft. Bowel sounds are normal. He exhibits no distension and no mass. There is no abdominal tenderness. There is no rebound and no guarding.  Musculoskeletal:        General: No tenderness, deformity or edema. Normal range of motion.     Cervical back: Normal range of motion and neck supple.  Lymphadenopathy:    He has no cervical adenopathy.  Neurological: He is alert and oriented to person, place, and time. He has normal reflexes. No cranial nerve deficit. He exhibits normal muscle tone. Coordination normal.  Skin: Skin is warm. No rash noted. He is not diaphoretic. No erythema. No pallor.  Psychiatric: He has a normal mood and affect. His behavior is normal. Judgment and thought content normal.  Vitals reviewed.         Assessment & Plan:  General medical exam - Plan: Lipid panel  Physical exam today is normal.  I will check his cholesterol.  I reviewed his CMP and CBC from his recent ER visit which were relatively normal except for a blood sugar that was elevated but it was nonfasting.  Spent the remainder of her time discussing his anxiety.  I recommended trying Lexapro 10 mg a day and reassessing via email 4 weeks.  I believe the patient is dealing with generalized anxiety and would benefit from an SSRI  to help manage this better and more effectively.  Hopefully this will help also manage his anger by controlling his anxiety more effectively

## 2020-04-22 ENCOUNTER — Encounter: Payer: 59 | Admitting: Family Medicine

## 2020-05-19 ENCOUNTER — Other Ambulatory Visit: Payer: Self-pay | Admitting: Family Medicine

## 2020-06-04 ENCOUNTER — Other Ambulatory Visit: Payer: Self-pay | Admitting: Nurse Practitioner

## 2020-06-08 ENCOUNTER — Encounter: Payer: Self-pay | Admitting: Family Medicine

## 2020-06-14 ENCOUNTER — Other Ambulatory Visit: Payer: Self-pay | Admitting: *Deleted

## 2020-06-14 DIAGNOSIS — Z13220 Encounter for screening for lipoid disorders: Secondary | ICD-10-CM

## 2020-06-17 ENCOUNTER — Other Ambulatory Visit: Payer: Self-pay

## 2020-06-17 ENCOUNTER — Other Ambulatory Visit: Payer: No Typology Code available for payment source

## 2020-06-17 DIAGNOSIS — Z13220 Encounter for screening for lipoid disorders: Secondary | ICD-10-CM

## 2020-06-17 LAB — COMPLETE METABOLIC PANEL WITH GFR
AG Ratio: 1.7 (calc) (ref 1.0–2.5)
ALT: 40 U/L (ref 9–46)
AST: 24 U/L (ref 10–40)
Albumin: 4.2 g/dL (ref 3.6–5.1)
Alkaline phosphatase (APISO): 79 U/L (ref 36–130)
BUN: 16 mg/dL (ref 7–25)
CO2: 29 mmol/L (ref 20–32)
Calcium: 9.3 mg/dL (ref 8.6–10.3)
Chloride: 102 mmol/L (ref 98–110)
Creat: 1.03 mg/dL (ref 0.60–1.35)
GFR, Est African American: 109 mL/min/{1.73_m2} (ref >=60)
GFR, Est Non African American: 94 mL/min/{1.73_m2} (ref >=60)
Globulin: 2.5 g/dL (calc) (ref 1.9–3.7)
Glucose, Bld: 95 mg/dL (ref 65–99)
Potassium: 4.1 mmol/L (ref 3.5–5.3)
Sodium: 138 mmol/L (ref 135–146)
Total Bilirubin: 1.1 mg/dL (ref 0.2–1.2)
Total Protein: 6.7 g/dL (ref 6.1–8.1)

## 2020-06-17 LAB — CBC WITH DIFFERENTIAL/PLATELET
Absolute Monocytes: 518 cells/uL (ref 200–950)
Basophils Absolute: 58 cells/uL (ref 0–200)
Basophils Relative: 0.9 %
Eosinophils Absolute: 269 cells/uL (ref 15–500)
Eosinophils Relative: 4.2 %
HCT: 47.6 % (ref 38.5–50.0)
Hemoglobin: 16.1 g/dL (ref 13.2–17.1)
Lymphs Abs: 2054 cells/uL (ref 850–3900)
MCH: 30.1 pg (ref 27.0–33.0)
MCHC: 33.8 g/dL (ref 32.0–36.0)
MCV: 89 fL (ref 80.0–100.0)
MPV: 10.2 fL (ref 7.5–12.5)
Monocytes Relative: 8.1 %
Neutro Abs: 3501 cells/uL (ref 1500–7800)
Neutrophils Relative %: 54.7 %
Platelets: 307 10*3/uL (ref 140–400)
RBC: 5.35 10*6/uL (ref 4.20–5.80)
RDW: 12.4 % (ref 11.0–15.0)
Total Lymphocyte: 32.1 %
WBC: 6.4 10*3/uL (ref 3.8–10.8)

## 2020-06-17 LAB — LIPID PANEL
Cholesterol: 231 mg/dL — ABNORMAL HIGH (ref <200)
HDL: 44 mg/dL (ref >=40)
LDL Cholesterol (Calc): 151 mg/dL (calc) — ABNORMAL HIGH
Non-HDL Cholesterol (Calc): 187 mg/dL (calc) — ABNORMAL HIGH (ref <130)
Total CHOL/HDL Ratio: 5.3 (calc) — ABNORMAL HIGH (ref <5.0)
Triglycerides: 216 mg/dL — ABNORMAL HIGH (ref <150)

## 2020-06-18 ENCOUNTER — Other Ambulatory Visit: Payer: Self-pay

## 2020-06-18 DIAGNOSIS — E7800 Pure hypercholesterolemia, unspecified: Secondary | ICD-10-CM

## 2020-06-18 MED ORDER — ATORVASTATIN CALCIUM 20 MG PO TABS: 20.0000 mg | ORAL_TABLET | Freq: Every day | ORAL | 3 refills | Status: DC

## 2020-06-29 ENCOUNTER — Encounter: Payer: Self-pay | Admitting: Family Medicine

## 2020-07-07 ENCOUNTER — Emergency Department (HOSPITAL_COMMUNITY): Payer: No Typology Code available for payment source

## 2020-07-07 ENCOUNTER — Other Ambulatory Visit: Payer: Self-pay

## 2020-07-07 ENCOUNTER — Encounter (HOSPITAL_COMMUNITY): Payer: Self-pay | Admitting: *Deleted

## 2020-07-07 ENCOUNTER — Emergency Department (HOSPITAL_COMMUNITY)
Admission: EM | Admit: 2020-07-07 | Discharge: 2020-07-07 | Disposition: A | Discharge status: Home / Self Care | Payer: No Typology Code available for payment source | Source: Home / Self Care | Attending: Emergency Medicine | Admitting: Emergency Medicine

## 2020-07-07 DIAGNOSIS — E860 Dehydration: Secondary | ICD-10-CM

## 2020-07-07 DIAGNOSIS — J1282 Pneumonia due to coronavirus disease 2019: Secondary | ICD-10-CM | POA: Insufficient documentation

## 2020-07-07 DIAGNOSIS — Z79899 Other long term (current) drug therapy: Secondary | ICD-10-CM | POA: Insufficient documentation

## 2020-07-07 DIAGNOSIS — U071 COVID-19: Secondary | ICD-10-CM | POA: Insufficient documentation

## 2020-07-07 DIAGNOSIS — I10 Essential (primary) hypertension: Secondary | ICD-10-CM | POA: Insufficient documentation

## 2020-07-07 DIAGNOSIS — E871 Hypo-osmolality and hyponatremia: Secondary | ICD-10-CM | POA: Insufficient documentation

## 2020-07-07 DIAGNOSIS — E876 Hypokalemia: Secondary | ICD-10-CM | POA: Insufficient documentation

## 2020-07-07 DIAGNOSIS — R42 Dizziness and giddiness: Secondary | ICD-10-CM

## 2020-07-07 DIAGNOSIS — Z87891 Personal history of nicotine dependence: Secondary | ICD-10-CM | POA: Insufficient documentation

## 2020-07-07 LAB — BASIC METABOLIC PANEL
Anion gap: 13 (ref 5–15)
BUN: 11 mg/dL (ref 6–20)
CO2: 23 mmol/L (ref 22–32)
Calcium: 8 mg/dL — ABNORMAL LOW (ref 8.9–10.3)
Chloride: 93 mmol/L — ABNORMAL LOW (ref 98–111)
Creatinine, Ser: 1.03 mg/dL (ref 0.61–1.24)
GFR calc Af Amer: >60 mL/min (ref >60)
GFR calc non Af Amer: >60 mL/min (ref >60)
Glucose, Bld: 107 mg/dL — ABNORMAL HIGH (ref 70–99)
Potassium: 3 mmol/L — ABNORMAL LOW (ref 3.5–5.1)
Sodium: 129 mmol/L — ABNORMAL LOW (ref 135–145)

## 2020-07-07 LAB — CBC WITH DIFFERENTIAL/PLATELET
Basophils Absolute: 0 10*3/uL (ref 0.0–0.1)
Basophils Relative: 0 %
Eosinophils Absolute: 0 10*3/uL (ref 0.0–0.5)
Eosinophils Relative: 0 %
HCT: 42.9 % (ref 39.0–52.0)
Hemoglobin: 15.1 g/dL (ref 13.0–17.0)
Lymphocytes Relative: 19 %
Lymphs Abs: 0.7 10*3/uL (ref 0.7–4.0)
MCH: 29.7 pg (ref 26.0–34.0)
MCHC: 35.2 g/dL (ref 30.0–36.0)
MCV: 84.3 fL (ref 80.0–100.0)
Monocytes Absolute: 0.2 10*3/uL (ref 0.1–1.0)
Monocytes Relative: 6 %
Neutro Abs: 2.6 10*3/uL (ref 1.7–7.7)
Neutrophils Relative %: 75 %
Platelets: 167 10*3/uL (ref 150–400)
RBC: 5.09 MIL/uL (ref 4.22–5.81)
RDW: 12 % (ref 11.5–15.5)
WBC: 3.5 10*3/uL — ABNORMAL LOW (ref 4.0–10.5)
nRBC: 0 % (ref 0.0–0.2)
nRBC: 0 /100 WBC

## 2020-07-07 LAB — MAGNESIUM: Magnesium: 2 mg/dL (ref 1.7–2.4)

## 2020-07-07 MED ORDER — AZITHROMYCIN 250 MG PO TABS: ORAL_TABLET | ORAL | 0 refills | Status: DC

## 2020-07-07 MED ORDER — SODIUM CHLORIDE 0.9 % IV BOLUS
1000.0000 mL | Freq: Once | INTRAVENOUS | Status: AC
  Administered 2020-07-07: 1000 mL via INTRAVENOUS

## 2020-07-07 MED ORDER — POTASSIUM CHLORIDE CRYS ER 20 MEQ PO TBCR
40.0000 meq | EXTENDED_RELEASE_TABLET | Freq: Once | ORAL | Status: AC
  Administered 2020-07-07: 40 meq via ORAL
  Filled 2020-07-07: qty 2

## 2020-07-07 MED ORDER — ONDANSETRON HCL 4 MG/2ML IJ SOLN
4.0000 mg | Freq: Once | INTRAMUSCULAR | Status: AC
  Administered 2020-07-07: 4 mg via INTRAVENOUS
  Filled 2020-07-07: qty 2

## 2020-07-07 MED ORDER — POTASSIUM CHLORIDE CRYS ER 20 MEQ PO TBCR: 20.0000 meq | EXTENDED_RELEASE_TABLET | Freq: Every day | ORAL | 0 refills | Status: DC

## 2020-07-07 MED ORDER — POTASSIUM CHLORIDE 10 MEQ/100ML IV SOLN
10.0000 meq | Freq: Once | INTRAVENOUS | Status: AC
  Administered 2020-07-07: 10 meq via INTRAVENOUS
  Filled 2020-07-07: qty 100

## 2020-07-07 MED ORDER — ACETAMINOPHEN 325 MG PO TABS
650.0000 mg | ORAL_TABLET | Freq: Once | ORAL | Status: AC | PRN
  Administered 2020-07-07: 650 mg via ORAL
  Filled 2020-07-07: qty 2

## 2020-07-07 MED ORDER — ONDANSETRON 4 MG PO TBDP: ORAL_TABLET | ORAL | 0 refills | Status: DC

## 2020-07-07 NOTE — ED Triage Notes (Signed)
Pt states he was diagnosed with covid x 6 days ago and states he is not getting any better; pt not able to keep anything down; pt states he feels dizzy and has had 2 near syncopal episodes

## 2020-07-07 NOTE — ED Provider Notes (Signed)
[facility] EMERGENCY DEPARTMENT Provider Note   CSN: [id number] Arrival date & time: 07/07/20  2202     History Chief Complaint  Patient presents with  . Covid Positive    Kent Hernandez is a 34 y.o. male.  Patient with history of reflux, high blood pressure presents with worsening lightheadedness, nausea and vomiting for the past 2 days.  Patient was diagnosed with Covid 6 days ago he feels his cough is getting better and not having significant breathing difficulty.  Patient fell accusing a pass out earlier today lightheaded sensation.  Patient not sleeping well and cannot keep regular fluids down.        Past Medical History:  Diagnosis Date  . Allergic rhinitis   . DDD (degenerative disc disease), cervical 2015  . Esophageal reflux   . GERD (gastroesophageal reflux disease)    Phreesia 04/12/2020  . H. pylori infection   . Hyperlipidemia    Phreesia 04/12/2020  . Hypertension    Phreesia 04/12/2020    Patient Active Problem List   Diagnosis Date Noted  . DDD (degenerative disc disease), cervical 08/09/2016  . Skin lesion 08/04/2013  . Genital warts   . Allergic rhinitis   . GERD (gastroesophageal reflux disease) 07/11/2011  . Dysphagia, unspecified(787.20) 06/12/2011  . Esophageal reflux 06/12/2011    History reviewed. No pertinent surgical history.     Family History  Problem Relation Age of Onset  . Diabetes Maternal Uncle   . Diabetes Paternal Grandfather   . Heart disease Paternal Grandfather   . Hypertension Father   . Kidney disease Father   . Cancer Maternal Grandmother   . Heart disease Paternal Grandmother     Social History   Tobacco Use  . Smoking status: Former Smoker    Types: Cigarettes  . Smokeless tobacco: Former Systems developer    Quit date: 02/12/2011  . Tobacco comment: Light use  Substance Use Topics  . Alcohol use: Yes    Comment: occ  . Drug use: No    Home Medications Prior to Admission medications   Medication Sig Start Date  End Date Taking? Authorizing Provider  atorvastatin (LIPITOR) 20 MG tablet Take 1 tablet (20 mg total) by mouth daily. 06/18/20  Yes Susy Frizzle, MD  cetirizine (ZYRTEC) 10 MG tablet Take 10 mg by mouth daily.   Yes [provider]  fluticasone (FLONASE) 50 MCG/ACT nasal spray Place 1 spray into both nostrils daily.   Yes [provider]  pantoprazole (PROTONIX) 40 MG tablet TAKE 1 TABLET BY MOUTH EVERY DAY 01/13/20  Yes Susy Frizzle, MD  azithromycin (ZITHROMAX Z-PAK) 250 MG tablet Take 2 tablets on day 1 then 1 tablet days 2 through 5. 07/07/20   Elnora Morrison, MD  escitalopram (LEXAPRO) 10 MG tablet TAKE 1 TABLET BY MOUTH EVERY DAY Patient not taking: Reported on 07/07/2020 06/04/20   Susy Frizzle, MD  metoprolol succinate (TOPROL-XL) 25 MG 24 hr tablet Take 1 tablet (25 mg total) by mouth daily. Patient not taking: Reported on 07/07/2020 04/05/20   Susy Frizzle, MD  ondansetron (ZOFRAN ODT) 4 MG disintegrating tablet 4mg  ODT q4 hours prn nausea/vomit 07/07/20   Elnora Morrison, MD  potassium chloride SA (KLOR-CON) 20 MEQ tablet Take 1 tablet (20 mEq total) by mouth daily. 07/07/20   Elnora Morrison, MD    Allergies    Patient has no known allergies.  Review of Systems   Review of Systems  Constitutional: Positive for chills, fatigue and  fever.  HENT: Positive for congestion.   Eyes: Negative for visual disturbance.  Respiratory: Positive for cough. Negative for shortness of breath.   Cardiovascular: Negative for chest pain.  Gastrointestinal: Positive for vomiting. Negative for abdominal pain.  Genitourinary: Negative for dysuria and flank pain.  Musculoskeletal: Negative for back pain, neck pain and neck stiffness.  Skin: Negative for rash.  Neurological: Positive for light-headedness. Negative for headaches.    Physical Exam Updated Vital Signs BP 121/85   Pulse 99   Temp 98 F (36.7 C) (Oral)   Resp 18   Ht 5\' 9"  (1.753 m)   Wt 76.2 kg   SpO2  93%   BMI 24.81 kg/m   Physical Exam Vitals and nursing note reviewed.  Constitutional:      Appearance: He is well-developed.  HENT:     Head: Normocephalic and atraumatic.     Mouth/Throat:     Mouth: Mucous membranes are dry.  Eyes:     General:        Right eye: No discharge.        Left eye: No discharge.     Conjunctiva/sclera: Conjunctivae normal.  Neck:     Trachea: No tracheal deviation.  Cardiovascular:     Rate and Rhythm: Regular rhythm. Tachycardia present.     Heart sounds: No murmur heard.   Pulmonary:     Effort: Pulmonary effort is normal.     Breath sounds: Rales present.  Abdominal:     General: There is no distension.     Palpations: Abdomen is soft.     Tenderness: There is no abdominal tenderness. There is no guarding.  Musculoskeletal:        General: No swelling. Normal range of motion.     Cervical back: Normal range of motion and neck supple.  Skin:    General: Skin is warm.     Capillary Refill: Capillary refill takes less than 2 seconds.     Findings: No rash.  Neurological:     Mental Status: He is alert and oriented to person, place, and time.  Psychiatric:        Mood and Affect: Mood normal.     ED Results / Procedures / Treatments   Labs (all labs ordered are listed, but only abnormal results are displayed) Labs Reviewed  BASIC METABOLIC PANEL - Abnormal; Notable for the following components:      Result Value   Sodium 129 (*)    Potassium 3.0 (*)    Chloride 93 (*)    Glucose, Bld 107 (*)    Calcium 8.0 (*)    All other components within normal limits  CBC WITH DIFFERENTIAL/PLATELET - Abnormal; Notable for the following components:   WBC 3.5 (*)    All other components within normal limits  MAGNESIUM  PATHOLOGIST SMEAR REVIEW    EKG EKG Interpretation  Date/Time:  Wednesday July 07 2020 11:38:24 EDT Ventricular Rate:  114 PR Interval:    QRS Duration: 88 QT Interval:  317 QTC Calculation: 437 R  Axis:   65 Text Interpretation: Sinus tachycardia Borderline T wave abnormalities Abnormal ECG Confirmed by Carmin Muskrat [phone]) on 07/08/2020 10:36:23 AM   Radiology No results found.  Procedures Procedures (including critical care time)  Medications Ordered in ED Medications  sodium chloride 0.9 % bolus 1,000 mL (0 mLs Intravenous Stopped 07/07/20 1133)  ondansetron (ZOFRAN) injection 4 mg (4 mg Intravenous Given 07/07/20 1041)  acetaminophen (TYLENOL) tablet 650 mg (650 mg  Oral Given 07/07/20 1126)  potassium chloride 10 mEq in 100 mL IVPB (0 mEq Intravenous Stopped 07/07/20 1306)  potassium chloride SA (KLOR-CON) CR tablet 40 mEq (40 mEq Oral Given 07/07/20 1146)    ED Course  I have reviewed the triage vital signs and the nursing notes.  Pertinent labs & imaging results that were available during my care of the patient were reviewed by me and considered in my medical decision making (see chart for details).    MDM Rules/Calculators/A&P                          Patient with known Covid presents with worsening clinical symptoms and clinically dehydrated.  Patient tachycardic on exam, febrile, normal oxygenation.  Plan for IV fluids, blood work, chest x-ray, Zofran for nausea and reassessment.  EKG reviewed sinus tachycardia. Patient's blood work reviewed sodium 129, potassium 3.  IV fluid bolus given to improve sodium and hydration, potassium given to improve levels.  On reassessment patient well-appearing no respiratory difficulty, not requiring oxygen.  Close outpatient follow-up and reasons to return given. LIZZIE COKLEY was evaluated in Emergency Department on 07/09/2020 for the symptoms described in the history of present illness. He was evaluated in the context of the global COVID-19 pandemic, which necessitated consideration that the patient might be at risk for infection with the SARS-CoV-2 virus that causes COVID-19. Institutional protocols and algorithms that pertain to the  evaluation of patients at risk for COVID-19 are in a state of rapid change based on information released by regulatory bodies including the CDC and federal and state organizations. These policies and algorithms were followed during the patient's care in the ED.   Final Clinical Impression(s) / ED Diagnoses Final diagnoses:  Lightheadedness  Pneumonia due to COVID-19 virus  Hypokalemia  Hyponatremia  Dehydration    Rx / DC Orders ED Discharge Orders         Ordered    azithromycin (ZITHROMAX Z-PAK) 250 MG tablet        07/07/20 1437    ondansetron (ZOFRAN ODT) 4 MG disintegrating tablet        07/07/20 1437    potassium chloride SA (KLOR-CON) 20 MEQ tablet  Daily        07/07/20 1437           Elnora Morrison, MD 07/09/20 1534

## 2020-07-07 NOTE — Discharge Instructions (Addendum)
Use Tylenol and ibuprofen for body aches and fevers. Take potassium as directed. Take zofran as needed for nausea and vomiting. Stay well-hydrated with water. Return for worsening shortness of breath or new concerns.

## 2020-07-07 NOTE — ED Notes (Signed)
Pt states he had positive covid test at [facility] last Wednesday.

## 2020-07-07 NOTE — ED Notes (Signed)
Apple sauce provided

## 2020-07-08 LAB — PATHOLOGIST SMEAR REVIEW

## 2020-07-10 ENCOUNTER — Encounter (HOSPITAL_COMMUNITY): Payer: Self-pay | Admitting: Emergency Medicine

## 2020-07-10 ENCOUNTER — Inpatient Hospital Stay (HOSPITAL_COMMUNITY)
Admission: EM | Admit: 2020-07-10 | Discharge: 2020-07-24 | DRG: 177 | Disposition: A | Discharge status: Home / Self Care | Payer: No Typology Code available for payment source | Attending: Student | Admitting: Student

## 2020-07-10 ENCOUNTER — Inpatient Hospital Stay (HOSPITAL_COMMUNITY): Payer: No Typology Code available for payment source

## 2020-07-10 ENCOUNTER — Encounter: Payer: Self-pay | Admitting: Family Medicine

## 2020-07-10 ENCOUNTER — Other Ambulatory Visit: Payer: Self-pay

## 2020-07-10 DIAGNOSIS — I10 Essential (primary) hypertension: Secondary | ICD-10-CM | POA: Diagnosis present

## 2020-07-10 DIAGNOSIS — K219 Gastro-esophageal reflux disease without esophagitis: Secondary | ICD-10-CM | POA: Diagnosis present

## 2020-07-10 DIAGNOSIS — E876 Hypokalemia: Secondary | ICD-10-CM | POA: Diagnosis present

## 2020-07-10 DIAGNOSIS — Z8249 Family history of ischemic heart disease and other diseases of the circulatory system: Secondary | ICD-10-CM

## 2020-07-10 DIAGNOSIS — J1282 Pneumonia due to coronavirus disease 2019: Secondary | ICD-10-CM | POA: Diagnosis present

## 2020-07-10 DIAGNOSIS — R509 Fever, unspecified: Secondary | ICD-10-CM

## 2020-07-10 DIAGNOSIS — R0902 Hypoxemia: Secondary | ICD-10-CM

## 2020-07-10 DIAGNOSIS — E785 Hyperlipidemia, unspecified: Secondary | ICD-10-CM | POA: Diagnosis present

## 2020-07-10 DIAGNOSIS — U071 COVID-19: Secondary | ICD-10-CM | POA: Diagnosis present

## 2020-07-10 DIAGNOSIS — R7401 Elevation of levels of liver transaminase levels: Secondary | ICD-10-CM | POA: Diagnosis present

## 2020-07-10 DIAGNOSIS — J309 Allergic rhinitis, unspecified: Secondary | ICD-10-CM | POA: Diagnosis present

## 2020-07-10 DIAGNOSIS — Z87891 Personal history of nicotine dependence: Secondary | ICD-10-CM

## 2020-07-10 DIAGNOSIS — Z79899 Other long term (current) drug therapy: Secondary | ICD-10-CM | POA: Diagnosis not present

## 2020-07-10 DIAGNOSIS — E871 Hypo-osmolality and hyponatremia: Secondary | ICD-10-CM | POA: Diagnosis present

## 2020-07-10 DIAGNOSIS — J9601 Acute respiratory failure with hypoxia: Secondary | ICD-10-CM | POA: Diagnosis present

## 2020-07-10 DIAGNOSIS — R000 Tachycardia, unspecified: Secondary | ICD-10-CM | POA: Diagnosis not present

## 2020-07-10 LAB — COMPREHENSIVE METABOLIC PANEL
ALT: 76 U/L — ABNORMAL HIGH (ref 0–44)
AST: 114 U/L — ABNORMAL HIGH (ref 15–41)
Albumin: 3.6 g/dL (ref 3.5–5.0)
Alkaline Phosphatase: 55 U/L (ref 38–126)
Anion gap: 12 (ref 5–15)
BUN: 12 mg/dL (ref 6–20)
CO2: 26 mmol/L (ref 22–32)
Calcium: 8.2 mg/dL — ABNORMAL LOW (ref 8.9–10.3)
Chloride: 95 mmol/L — ABNORMAL LOW (ref 98–111)
Creatinine, Ser: 0.82 mg/dL (ref 0.61–1.24)
GFR calc Af Amer: >60 mL/min (ref >60)
GFR calc non Af Amer: >60 mL/min (ref >60)
Glucose, Bld: 139 mg/dL — ABNORMAL HIGH (ref 70–99)
Potassium: 2.7 mmol/L — CL (ref 3.5–5.1)
Sodium: 133 mmol/L — ABNORMAL LOW (ref 135–145)
Total Bilirubin: 0.9 mg/dL (ref 0.3–1.2)
Total Protein: 7 g/dL (ref 6.5–8.1)

## 2020-07-10 LAB — CBC WITH DIFFERENTIAL/PLATELET
Abs Immature Granulocytes: 0.1 10*3/uL — ABNORMAL HIGH (ref 0.00–0.07)
Basophils Absolute: 0 10*3/uL (ref 0.0–0.1)
Basophils Relative: 0 %
Eosinophils Absolute: 0 10*3/uL (ref 0.0–0.5)
Eosinophils Relative: 0 %
HCT: 42 % (ref 39.0–52.0)
Hemoglobin: 14.6 g/dL (ref 13.0–17.0)
Immature Granulocytes: 1 %
Lymphocytes Relative: 11 %
Lymphs Abs: 1 10*3/uL (ref 0.7–4.0)
MCH: 29.3 pg (ref 26.0–34.0)
MCHC: 34.8 g/dL (ref 30.0–36.0)
MCV: 84.3 fL (ref 80.0–100.0)
Monocytes Absolute: 0.5 10*3/uL (ref 0.1–1.0)
Monocytes Relative: 6 %
Neutro Abs: 7.5 10*3/uL (ref 1.7–7.7)
Neutrophils Relative %: 82 %
Platelets: 336 10*3/uL (ref 150–400)
RBC: 4.98 MIL/uL (ref 4.22–5.81)
RDW: 12 % (ref 11.5–15.5)
WBC: 9.1 10*3/uL (ref 4.0–10.5)
nRBC: 0 % (ref 0.0–0.2)

## 2020-07-10 LAB — FIBRINOGEN: Fibrinogen: 745 mg/dL — ABNORMAL HIGH (ref 210–475)

## 2020-07-10 LAB — LACTATE DEHYDROGENASE: LDH: 1180 U/L — ABNORMAL HIGH (ref 98–192)

## 2020-07-10 LAB — D-DIMER, QUANTITATIVE: D-Dimer, Quant: 1.72 ug/mL-FEU — ABNORMAL HIGH (ref 0.00–0.50)

## 2020-07-10 LAB — TRIGLYCERIDES: Triglycerides: 128 mg/dL (ref <150)

## 2020-07-10 LAB — LACTIC ACID, PLASMA: Lactic Acid, Venous: 1.2 mmol/L (ref 0.5–1.9)

## 2020-07-10 MED ORDER — SODIUM CHLORIDE 0.9 % IV SOLN
200.0000 mg | Freq: Once | INTRAVENOUS | Status: DC
  Filled 2020-07-10: qty 40

## 2020-07-10 MED ORDER — HYDROCOD POLST-CPM POLST ER 10-8 MG/5ML PO SUER
5.0000 mL | Freq: Two times a day (BID) | ORAL | Status: DC | PRN
  Administered 2020-07-16 – 2020-07-17 (×2): 5 mL via ORAL
  Filled 2020-07-10 (×5): qty 5

## 2020-07-10 MED ORDER — POTASSIUM CHLORIDE IN NACL 40-0.9 MEQ/L-% IV SOLN
INTRAVENOUS | Status: AC
  Filled 2020-07-10 (×2): qty 1000

## 2020-07-10 MED ORDER — SODIUM CHLORIDE 0.9 % IV SOLN: 100.0000 mg | Freq: Every day | INTRAVENOUS | Status: DC

## 2020-07-10 MED ORDER — ACETAMINOPHEN 325 MG PO TABS
650.0000 mg | ORAL_TABLET | Freq: Four times a day (QID) | ORAL | Status: DC | PRN
  Administered 2020-07-13 – 2020-07-23 (×5): 650 mg via ORAL
  Filled 2020-07-10 (×6): qty 2

## 2020-07-10 MED ORDER — ATORVASTATIN CALCIUM 20 MG PO TABS
20.0000 mg | ORAL_TABLET | Freq: Every day | ORAL | Status: DC
  Administered 2020-07-11 – 2020-07-24 (×14): 20 mg via ORAL
  Filled 2020-07-10: qty 2
  Filled 2020-07-10: qty 1
  Filled 2020-07-10: qty 2
  Filled 2020-07-10: qty 1
  Filled 2020-07-10: qty 2
  Filled 2020-07-10: qty 1
  Filled 2020-07-10 (×2): qty 2
  Filled 2020-07-10 (×3): qty 1
  Filled 2020-07-10 (×3): qty 2

## 2020-07-10 MED ORDER — ALBUTEROL SULFATE HFA 108 (90 BASE) MCG/ACT IN AERS
2.0000 | INHALATION_SPRAY | Freq: Four times a day (QID) | RESPIRATORY_TRACT | Status: DC
  Administered 2020-07-11 – 2020-07-12 (×5): 2 via RESPIRATORY_TRACT
  Filled 2020-07-10 (×2): qty 6.7

## 2020-07-10 MED ORDER — PANTOPRAZOLE SODIUM 40 MG PO TBEC
40.0000 mg | DELAYED_RELEASE_TABLET | Freq: Every day | ORAL | Status: DC
  Administered 2020-07-11 – 2020-07-24 (×14): 40 mg via ORAL
  Filled 2020-07-10 (×14): qty 1

## 2020-07-10 MED ORDER — ZINC SULFATE 220 (50 ZN) MG PO CAPS
220.0000 mg | ORAL_CAPSULE | Freq: Every day | ORAL | Status: DC
  Administered 2020-07-11 – 2020-07-24 (×14): 220 mg via ORAL
  Filled 2020-07-10 (×14): qty 1

## 2020-07-10 MED ORDER — IOHEXOL 350 MG/ML SOLN
75.0000 mL | Freq: Once | INTRAVENOUS | Status: AC | PRN
  Administered 2020-07-11: 75 mL via INTRAVENOUS

## 2020-07-10 MED ORDER — ONDANSETRON HCL 4 MG PO TABS: 4.0000 mg | ORAL_TABLET | Freq: Four times a day (QID) | ORAL | Status: DC | PRN

## 2020-07-10 MED ORDER — POTASSIUM CHLORIDE 10 MEQ/100ML IV SOLN
10.0000 meq | INTRAVENOUS | Status: AC
  Administered 2020-07-11 (×2): 10 meq via INTRAVENOUS
  Filled 2020-07-10 (×2): qty 100

## 2020-07-10 MED ORDER — GUAIFENESIN-DM 100-10 MG/5ML PO SYRP
10.0000 mL | ORAL_SOLUTION | ORAL | Status: DC | PRN
  Administered 2020-07-13 – 2020-07-19 (×6): 10 mL via ORAL
  Filled 2020-07-10 (×6): qty 10

## 2020-07-10 MED ORDER — ASCORBIC ACID 500 MG PO TABS
500.0000 mg | ORAL_TABLET | Freq: Every day | ORAL | Status: DC
  Administered 2020-07-11 – 2020-07-24 (×14): 500 mg via ORAL
  Filled 2020-07-10 (×14): qty 1

## 2020-07-10 MED ORDER — ENOXAPARIN SODIUM 40 MG/0.4ML ~~LOC~~ SOLN: 40.0000 mg | Freq: Every day | SUBCUTANEOUS | Status: DC

## 2020-07-10 MED ORDER — ONDANSETRON HCL 4 MG/2ML IJ SOLN
4.0000 mg | Freq: Four times a day (QID) | INTRAMUSCULAR | Status: DC | PRN
  Administered 2020-07-14: 4 mg via INTRAVENOUS
  Filled 2020-07-10: qty 2

## 2020-07-10 MED ORDER — METOPROLOL SUCCINATE ER 25 MG PO TB24
25.0000 mg | ORAL_TABLET | Freq: Every day | ORAL | Status: DC
  Administered 2020-07-13: 25 mg via ORAL
  Filled 2020-07-10 (×4): qty 1

## 2020-07-10 MED ORDER — METHYLPREDNISOLONE SODIUM SUCC 40 MG IJ SOLR
0.5000 mg/kg | Freq: Two times a day (BID) | INTRAMUSCULAR | Status: DC
  Administered 2020-07-11 – 2020-07-13 (×6): 36 mg via INTRAVENOUS
  Filled 2020-07-10 (×6): qty 1

## 2020-07-10 MED ORDER — ACETAMINOPHEN 650 MG RE SUPP: 650.0000 mg | Freq: Four times a day (QID) | RECTAL | Status: DC | PRN

## 2020-07-10 NOTE — ED Triage Notes (Signed)
Pt is covid positive and has O2 sats of 86 on room air.

## 2020-07-10 NOTE — ED Provider Notes (Signed)
86% on RA Recently dx with Covid PNA On exam has subtle rales but no distress Requiring O2 to keep sats of 90%  Needs admission with covid tx and supportive care / O2.  CRITICAL CARE Performed by: Johnna Acosta Total critical care time: 35 minutes Critical care time was exclusive of separately billable procedures and treating other patients. Critical care was necessary to treat or prevent imminent or life-threatening deterioration. Critical care was time spent personally by me on the following activities: development of treatment plan with patient and/or surrogate as well as nursing, discussions with consultants, evaluation of patient's response to treatment, examination of patient, obtaining history from patient or surrogate, ordering and performing treatments and interventions, ordering and review of laboratory studies, ordering and review of radiographic studies, pulse oximetry and re-evaluation of patient's condition.    Medical screening examination/treatment/procedure(s) were conducted as a shared visit with non-physician practitioner(s) and myself.  I personally evaluated the patient during the encounter.  Clinical Impression:   Final diagnoses:  NBVAP-01 virus infection  Hypoxia      EKG Interpretation  Date/Time:  Saturday July 10 2020 21:33:08 EDT Ventricular Rate:  96 PR Interval:    QRS Duration: 88 QT Interval:  368 QTC Calculation: 465 R Axis:   69 Text Interpretation: Sinus rhythm Baseline wander in lead(s) V6 ECG OTHERWISE WITHIN NORMAL LIMITS Confirmed by Noemi Chapel [phone]) on 07/10/2020 10:41:00 PM          Noemi Chapel, MD 07/12/20 1128

## 2020-07-10 NOTE — ED Notes (Signed)
Date and time results received: 07/10/20 2300   Test: K+ Critical Value: 2.7  Name of Provider Notified: Noemi Chapel MD

## 2020-07-10 NOTE — H&P (Signed)
History and Physical    Kent Hernandez EPP:[id number] DOB: 1985/12/17 DOA: 07/10/2020  PCP: Susy Frizzle, MD   Patient coming from: Home.  I have personally briefly reviewed patient's old medical records in [place]  Chief Complaint: Shortness of breath and hypoxia.  HPI: Kent Hernandez is a 34 y.o. male with medical history significant of allergic rhinitis, cervical DDD, GERD, H. pylori infection, hyperlipidemia, hypertension who is coming to the emergency department with a history of being exposed to his wife who developed URI symptoms before he did and then was subsequently diagnosed with COVID-19 on 07/01/2020 at [place] [facility].  He has been doing relatively well with just upper respiratory symptoms until he developed a dry cough, started feeling nauseous with some emesis and and at least 3 episodes of loose stools daily for the past few days.  He has been getting progressively dyspneic at home over the last few days.  He has monitoring his oxygen at home with a home pulse oximeter and noticed that his oxygen saturation was in the mid 80s.  He has had fever, chills, malaise, decreased appetite and difficulty sleeping.  He denies headache, wheezing, hemoptysis, chest pain, palpitations, diaphoresis, PND, orthopnea or pitting edema of the lower extremities.  No abdominal pain, constipation, melena or hematochezia.  Denies dysuria, frequency or hematuria.  No polyuria, polydipsia, polyphagia or blurred vision.  ED Course: Initial vital signs were temperature 99 F, pulse 110, respirations 22, blood pressure 136/78 mmHg and O2 sat 84% on room air.  The patient received IV potassium replacement in the emergency department.  I ordered Solu-Medrol 125 mg IVP and 0.9% NaCl with KCl 40 mEq at 125 mL/h for 8 hours.  CBC was normal.  Sodium is 133, potassium 2.7, chloride 95 and CO2 26.  Renal function was normal.  Glucose 139 and calcium 8.2 mg/dL.AST is 114 and ALT 76 units/L.  The rest  of the hepatic functions are within normal range.  Triglycerides, lactic acid and procalcitonin were within normal range.  D-dimer was 1.72, fibrinogen 745, LDH 1180, CRP 8.4 and ferritin 1799.  Imaging: CTA chest did not show evidence of PE.  There were bilateral patchy groundglass opacities consistent with Covid pneumonia.  There is mildly enlarged right hilar lymph nodes likely reactive.  Indeterminate ill-defined left liver hypodense lesion likely hemangioma.  Please see images and full values report for further detail.  Review of Systems: As per HPI otherwise all other systems reviewed and are negative.  Past Medical History:  Diagnosis Date  . Allergic rhinitis   . DDD (degenerative disc disease), cervical 2015  . Esophageal reflux   . GERD (gastroesophageal reflux disease)    Phreesia 04/12/2020  . H. pylori infection   . Hyperlipidemia    Phreesia 04/12/2020  . Hypertension    Phreesia 04/12/2020   History reviewed. No pertinent surgical history.  Social History  reports that he has quit smoking. His smoking use included cigarettes. He quit smokeless tobacco use about 9 years ago. He reports current alcohol use. He reports that he does not use drugs.  No Known Allergies  Family History  Problem Relation Age of Onset  . Diabetes Maternal Uncle   . Diabetes Paternal Grandfather   . Heart disease Paternal Grandfather   . Hypertension Father   . Kidney disease Father   . Cancer Maternal Grandmother   . Heart disease Paternal Grandmother    Prior to Admission medications   Medication Sig Start  Date End Date Taking? Authorizing Provider  atorvastatin (LIPITOR) 20 MG tablet Take 1 tablet (20 mg total) by mouth daily. 06/18/20   Susy Frizzle, MD  azithromycin (ZITHROMAX Z-PAK) 250 MG tablet Take 2 tablets on day 1 then 1 tablet days 2 through 5. 07/07/20   Elnora Morrison, MD  cetirizine (ZYRTEC) 10 MG tablet Take 10 mg by mouth daily.    [provider]    escitalopram (LEXAPRO) 10 MG tablet TAKE 1 TABLET BY MOUTH EVERY DAY Patient not taking: Reported on 07/07/2020 06/04/20   Susy Frizzle, MD  fluticasone [facility]) 50 MCG/ACT nasal spray Place 1 spray into both nostrils daily.    [provider]  metoprolol succinate (TOPROL-XL) 25 MG 24 hr tablet Take 1 tablet (25 mg total) by mouth daily. Patient not taking: Reported on 07/07/2020 04/05/20   Susy Frizzle, MD  ondansetron (ZOFRAN ODT) 4 MG disintegrating tablet 4mg  ODT q4 hours prn nausea/vomit 07/07/20   Elnora Morrison, MD  pantoprazole (PROTONIX) 40 MG tablet TAKE 1 TABLET BY MOUTH EVERY DAY 01/13/20   Susy Frizzle, MD  potassium chloride SA (KLOR-CON) 20 MEQ tablet Take 1 tablet (20 mEq total) by mouth daily. 07/07/20   Elnora Morrison, MD   Physical Exam: Vitals:   07/10/20 2130 07/10/20 2150 07/10/20 2200 07/10/20 2300  BP:  133/89 (!) 136/92 (!) 130/91  Pulse: 96 (!) 102 (!) 101 92  Resp: (!) 28 (!) 21 (!) 24 (!) 24  Temp:      TempSrc:      SpO2: 91% 93% 94% 93%  Weight:      Height:       Constitutional: Looks acutely ill. Eyes: PERRL, lids and conjunctivae mildly injected. ENMT: Mucous membranes are mildly dry. Posterior pharynx clear of any exudate or lesions. Neck: normal, supple, no masses, no thyromegaly Respiratory: Mildly tachypneic.  Bilateral scattered crackles.  No wheezing.  No accessory muscle use.  Cardiovascular: Tachycardic at 103 bpm, no murmurs / rubs / gallops. No extremity edema. 2+ pedal pulses. No carotid bruits.  Abdomen: Nondistended.  BS positive.  Soft, no tenderness, no masses palpated. No hepatosplenomegaly. Musculoskeletal: no clubbing / cyanosis.  Good ROM, no contractures. Normal muscle tone.  Skin: no clinically significant rashes, lesions, ulcers on very limited dermatological inspection. Neurologic: CN 2-12 grossly intact. Sensation intact, DTR normal. Strength 5/5 in all 4.  Psychiatric: Normal judgment and insight. Alert and  oriented x 3. Normal mood.   Labs on Admission: I have personally reviewed following labs and imaging studies  CBC: Recent Labs  Lab 07/07/20 1044 07/10/20 2200  WBC 3.5* 9.1  NEUTROABS 2.6 7.5  HGB 15.1 14.6  HCT 42.9 42.0  MCV 84.3 84.3  PLT 167 098    Basic Metabolic Panel: Recent Labs  Lab 07/07/20 1044 07/10/20 2200  NA 129* 133*  K 3.0* 2.7*  CL 93* 95*  CO2 23 26  GLUCOSE 107* 139*  BUN 11 12  CREATININE 1.03 0.82  CALCIUM 8.0* 8.2*  MG 2.0  --     GFR: Estimated Creatinine Clearance: 125.7 mL/min (by C-G formula based on SCr of 0.82 mg/dL).  Liver Function Tests: Recent Labs  Lab 07/10/20 2200  AST 114*  ALT 76*  ALKPHOS 55  BILITOT 0.9  PROT 7.0  ALBUMIN 3.6    Urine analysis: No results found for: COLORURINE, APPEARANCEUR, LABSPEC, PHURINE, GLUCOSEU, HGBUR, BILIRUBINUR, KETONESUR, PROTEINUR, UROBILINOGEN, NITRITE, LEUKOCYTESUR  Radiological Exams on Admission: No results found.  EKG: Independently reviewed.  Vent. rate 96 BPM PR interval * ms QRS duration 88 ms QT/QTc 368/465 ms P-R-T axes 23 69 52 Sinus rhythm Baseline wander in lead(s) V6  Assessment/Plan Principal Problem:   Pneumonia due to COVID-19 virus Admit to telemetry/inpatient. Continue supplemental oxygen. Continue bronchodilators. Antitussives as needed. Vitamin C and zinc supplementation. Continue Solu-Medrol 36 mg IVP every 12 hours. Remdesivir per pharmacy. Follow-up CBC, CMP and inflammatory markers.  Active Problems:   Hypercalcemia Recheck calcium level in a.m. Further work-up depending on result.    Transaminitis Follow AST/ALT in a.m.    GERD (gastroesophageal reflux disease) Continue pantoprazole 40 mg p.o. daily.    Hyperlipidemia Continue atorvastatin 20 mg p.o. daily.    Hypertension Continue metoprolol succinate 25 mg p.o. daily. Monitor blood pressure and heart rate.    DVT prophylaxis: Lovenox SQ. Code Status:   Full code. Family  Communication: Disposition Plan:   Patient is from:  Home.  Anticipated DC to:  Home.  Anticipated DC date:  07/12/2020.  Anticipated DC barriers: Clinical status.  Consults called: Admission status:  Inpatient/telemetry.    Severity of Illness:High given the overall worsening condition, along with progressive hypoxia and new oxygen requirement.  He is being admitted for acute Covid 19 pneumonia treatment.  Reubin Milan MD [facility]  How to contact the [facility] Attending or Consulting provider Pray or covering provider during after hours [place], for this patient?   1. Check the care team in [facility] and look for a) attending/consulting [facility] provider listed and b) the [facility] team listed 2. Log into [email or web address] and use [place]'s universal password to access. If you do not have the password, please contact the hospital operator. 3. Locate the [facility] provider you are looking for under [facility] and page to a number that you can be directly reached. 4. If you still have difficulty reaching the provider, please page the [facility] (Director on Call) for the Hospitalists listed on amion for assistance.  07/10/2020, 11:39 PM   This document was prepared using Dragon voice recognition software and may contain some unintended transcription errors.

## 2020-07-10 NOTE — ED Provider Notes (Signed)
[facility] EMERGENCY DEPARTMENT Provider Note   CSN: [id number] Arrival date & time: 07/10/20  2017     History Chief Complaint  Patient presents with  . Shortness of Breath-Covid Positive    Kent Hernandez is a 34 y.o. male.  HPI      Kent Hernandez is a 34 y.o. male with past medical history for GERD, hypertension, hyperlipidemia and diagnosed with COVID on 06/30/2020 at [facility].  He was seen here in the emergency department on 07/07/2020 for lightheadedness, nausea and vomiting.  He states his vomiting has been improving, but return to the emergency department this evening due to increased work of breathing and low oxygen levels at home.  He states he has been monitoring his oxygen with a portable pulse oximeter and had oxygen level of 85% earlier today. He has albuterol MDI at home using without relief.  He reports persistent coughing that is mostly nonproductive.  He has been tolerating oral fluids today.  He denies any chest or abdominal pain.  His wife also currently has Covid.  He has not been vaccinated.   Past Medical History:  Diagnosis Date  . Allergic rhinitis   . DDD (degenerative disc disease), cervical 2015  . Esophageal reflux   . GERD (gastroesophageal reflux disease)    Phreesia 04/12/2020  . H. pylori infection   . Hyperlipidemia    Phreesia 04/12/2020  . Hypertension    Phreesia 04/12/2020    Patient Active Problem List   Diagnosis Date Noted  . DDD (degenerative disc disease), cervical 08/09/2016  . Skin lesion 08/04/2013  . Genital warts   . Allergic rhinitis   . GERD (gastroesophageal reflux disease) 07/11/2011  . Dysphagia, unspecified(787.20) 06/12/2011  . Esophageal reflux 06/12/2011    History reviewed. No pertinent surgical history.     Family History  Problem Relation Age of Onset  . Diabetes Maternal Uncle   . Diabetes Paternal Grandfather   . Heart disease Paternal Grandfather   . Hypertension Father   . Kidney  disease Father   . Cancer Maternal Grandmother   . Heart disease Paternal Grandmother     Social History   Tobacco Use  . Smoking status: Former Smoker    Types: Cigarettes  . Smokeless tobacco: Former Systems developer    Quit date: 02/12/2011  . Tobacco comment: Light use  Substance Use Topics  . Alcohol use: Yes    Comment: occ  . Drug use: No    Home Medications Prior to Admission medications   Medication Sig Start Date End Date Taking? Authorizing Provider  atorvastatin (LIPITOR) 20 MG tablet Take 1 tablet (20 mg total) by mouth daily. 06/18/20   Susy Frizzle, MD  azithromycin (ZITHROMAX Z-PAK) 250 MG tablet Take 2 tablets on day 1 then 1 tablet days 2 through 5. 07/07/20   Elnora Morrison, MD  cetirizine (ZYRTEC) 10 MG tablet Take 10 mg by mouth daily.    [provider]  escitalopram (LEXAPRO) 10 MG tablet TAKE 1 TABLET BY MOUTH EVERY DAY Patient not taking: Reported on 07/07/2020 06/04/20   Susy Frizzle, MD  fluticasone [facility]) 50 MCG/ACT nasal spray Place 1 spray into both nostrils daily.    [provider]  metoprolol succinate (TOPROL-XL) 25 MG 24 hr tablet Take 1 tablet (25 mg total) by mouth daily. Patient not taking: Reported on 07/07/2020 04/05/20   Susy Frizzle, MD  ondansetron (ZOFRAN ODT) 4 MG disintegrating tablet 4mg  ODT q4 hours prn  nausea/vomit 07/07/20   Elnora Morrison, MD  pantoprazole (PROTONIX) 40 MG tablet TAKE 1 TABLET BY MOUTH EVERY DAY 01/13/20   Susy Frizzle, MD  potassium chloride SA (KLOR-CON) 20 MEQ tablet Take 1 tablet (20 mEq total) by mouth daily. 07/07/20   Elnora Morrison, MD    Allergies    Patient has no known allergies.  Review of Systems   Review of Systems  Constitutional: Positive for fatigue. Negative for appetite change, chills and fever.  HENT: Negative for congestion and sore throat.   Respiratory: Positive for cough, chest tightness and shortness of breath. Negative for wheezing.   Cardiovascular: Negative for  chest pain.  Gastrointestinal: Negative for abdominal pain, diarrhea, nausea and vomiting.  Genitourinary: Negative for dysuria.  Musculoskeletal: Negative for arthralgias.  Skin: Negative for rash.  Neurological: Negative for dizziness, weakness, numbness and headaches.  Hematological: Negative for adenopathy.    Physical Exam Updated Vital Signs BP (!) 136/92   Pulse (!) 101   Temp 99 F (37.2 C) (Oral)   Resp (!) 24   Ht 5\' 9"  (1.753 m)   Wt 71.7 kg   SpO2 94%   BMI 23.33 kg/m   Physical Exam Vitals and nursing note reviewed.  Constitutional:      General: He is not in acute distress.    Appearance: Normal appearance. He is well-developed. He is not ill-appearing.  HENT:     Head: Normocephalic and atraumatic.     Mouth/Throat:     Pharynx: Uvula midline. No oropharyngeal exudate.  Neck:     Trachea: Phonation normal.  Cardiovascular:     Rate and Rhythm: Normal rate and regular rhythm.     Pulses: Normal pulses.     Heart sounds: Normal heart sounds. No murmur heard.   Pulmonary:     Effort: Pulmonary effort is normal. No respiratory distress.     Breath sounds: No stridor. No rales.  Chest:     Chest wall: No tenderness.  Abdominal:     General: There is no distension.     Palpations: Abdomen is soft.     Tenderness: There is no abdominal tenderness.  Musculoskeletal:        General: Normal range of motion.     Cervical back: Full passive range of motion without pain, normal range of motion and neck supple.     Right lower leg: No edema.     Left lower leg: No edema.  Lymphadenopathy:     Cervical: No cervical adenopathy.  Skin:    General: Skin is warm.     Capillary Refill: Capillary refill takes less than 2 seconds.     Findings: No rash.  Neurological:     Mental Status: He is alert.     Sensory: No sensory deficit.     Motor: No weakness or abnormal muscle tone.     Coordination: Coordination normal.     ED Results / Procedures / Treatments    Labs (all labs ordered are listed, but only abnormal results are displayed) Labs Reviewed  COMPREHENSIVE METABOLIC PANEL - Abnormal; Notable for the following components:      Result Value   Sodium 133 (*)    Potassium 2.7 (*)    Chloride 95 (*)    Glucose, Bld 139 (*)    Calcium 8.2 (*)    AST 114 (*)    ALT 76 (*)    All other components within normal limits  D-DIMER, QUANTITATIVE (NOT AT [facility]) -  Abnormal; Notable for the following components:   D-Dimer, Quant 1.72 (*)    All other components within normal limits  LACTATE DEHYDROGENASE - Abnormal; Notable for the following components:   LDH 1,180 (*)    All other components within normal limits  FIBRINOGEN - Abnormal; Notable for the following components:   Fibrinogen 745 (*)    All other components within normal limits  CULTURE, BLOOD (ROUTINE X 2)  CULTURE, BLOOD (ROUTINE X 2)  LACTIC ACID, PLASMA  CBC WITH DIFFERENTIAL/PLATELET  TRIGLYCERIDES  LACTIC ACID, PLASMA  PROCALCITONIN  FERRITIN  C-REACTIVE PROTEIN    EKG EKG Interpretation  Date/Time:  Saturday July 10 2020 21:33:08 EDT Ventricular Rate:  96 PR Interval:    QRS Duration: 88 QT Interval:  368 QTC Calculation: 465 R Axis:   69 Text Interpretation: Sinus rhythm Baseline wander in lead(s) V6 ECG OTHERWISE WITHIN NORMAL LIMITS Confirmed by Noemi Chapel [phone]) on 07/10/2020 10:41:00 PM   Radiology No results found.  Procedures Procedures (including critical care time)  Medications Ordered in ED Medications - No data to display  ED Course  I have reviewed the triage vital signs and the nursing notes.  Pertinent labs & imaging results that were available during my care of the patient were reviewed by me and considered in my medical decision making (see chart for details).    MDM Rules/Calculators/A&P                          Confirmed patient with Covid seen here on 07/07/2020 for possible syncopal episode, nausea and vomiting.  He was  discharged home, returns today due to increased work of breathing and low oxygen level at home.  His x-ray from 07/07/2020 shows Covid associated bilateral pneumonia.  on arrival this evening, patient's O2 sat was 84% on room air he was placed on 2 L by nasal cannula and oxygen levels improved to the low 90s.  He will need admission due to his hypoxia.  Patient does not have a documented positive Covid test in Epic, will repeat test for confirmation.  His D-dimer this evening is elevated, will proceed with CT angio of the chest.  I have consulted hospitalist, Dr. Olevia Bowens who agrees to admit  Final Clinical Impression(s) / ED Diagnoses Final diagnoses:  None    Rx / DC Orders ED Discharge Orders    None       Bufford Lope 07/10/20 2342    Noemi Chapel, MD 07/12/20 [phone]

## 2020-07-11 DIAGNOSIS — R7401 Elevation of levels of liver transaminase levels: Secondary | ICD-10-CM

## 2020-07-11 LAB — CBC WITH DIFFERENTIAL/PLATELET
Abs Immature Granulocytes: 0.08 10*3/uL — ABNORMAL HIGH (ref 0.00–0.07)
Basophils Absolute: 0 10*3/uL (ref 0.0–0.1)
Basophils Relative: 1 %
Eosinophils Absolute: 0 10*3/uL (ref 0.0–0.5)
Eosinophils Relative: 0 %
HCT: 39 % (ref 39.0–52.0)
Hemoglobin: 13.5 g/dL (ref 13.0–17.0)
Immature Granulocytes: 2 %
Lymphocytes Relative: 14 %
Lymphs Abs: 0.6 10*3/uL — ABNORMAL LOW (ref 0.7–4.0)
MCH: 29.6 pg (ref 26.0–34.0)
MCHC: 34.6 g/dL (ref 30.0–36.0)
MCV: 85.5 fL (ref 80.0–100.0)
Monocytes Absolute: 0.1 10*3/uL (ref 0.1–1.0)
Monocytes Relative: 3 %
Neutro Abs: 3.3 10*3/uL (ref 1.7–7.7)
Neutrophils Relative %: 80 %
Platelets: 343 10*3/uL (ref 150–400)
RBC: 4.56 MIL/uL (ref 4.22–5.81)
RDW: 12.3 % (ref 11.5–15.5)
WBC: 4.1 10*3/uL (ref 4.0–10.5)
nRBC: 0 % (ref 0.0–0.2)

## 2020-07-11 LAB — COMPREHENSIVE METABOLIC PANEL
ALT: 70 U/L — ABNORMAL HIGH (ref 0–44)
AST: 88 U/L — ABNORMAL HIGH (ref 15–41)
Albumin: 3.1 g/dL — ABNORMAL LOW (ref 3.5–5.0)
Alkaline Phosphatase: 48 U/L (ref 38–126)
Anion gap: 11 (ref 5–15)
BUN: 8 mg/dL (ref 6–20)
CO2: 23 mmol/L (ref 22–32)
Calcium: 7.5 mg/dL — ABNORMAL LOW (ref 8.9–10.3)
Chloride: 104 mmol/L (ref 98–111)
Creatinine, Ser: 0.64 mg/dL (ref 0.61–1.24)
GFR calc Af Amer: >60 mL/min (ref >60)
GFR calc non Af Amer: >60 mL/min (ref >60)
Glucose, Bld: 156 mg/dL — ABNORMAL HIGH (ref 70–99)
Potassium: 3.4 mmol/L — ABNORMAL LOW (ref 3.5–5.1)
Sodium: 138 mmol/L (ref 135–145)
Total Bilirubin: 1 mg/dL (ref 0.3–1.2)
Total Protein: 6.3 g/dL — ABNORMAL LOW (ref 6.5–8.1)

## 2020-07-11 LAB — MAGNESIUM: Magnesium: 2.1 mg/dL (ref 1.7–2.4)

## 2020-07-11 LAB — PROCALCITONIN: Procalcitonin: <0.1 ng/mL

## 2020-07-11 LAB — HIV ANTIBODY (ROUTINE TESTING W REFLEX): HIV Screen 4th Generation wRfx: NONREACTIVE

## 2020-07-11 LAB — C-REACTIVE PROTEIN
CRP: 8.4 mg/dL — ABNORMAL HIGH (ref <1.0)
CRP: 5.8 mg/dL — ABNORMAL HIGH (ref <1.0)

## 2020-07-11 LAB — SARS CORONAVIRUS 2 BY RT PCR (HOSPITAL ORDER, PERFORMED IN ~~LOC~~ HOSPITAL LAB): SARS Coronavirus 2: POSITIVE — AB

## 2020-07-11 LAB — FERRITIN
Ferritin: 1799 ng/mL — ABNORMAL HIGH (ref 24–336)
Ferritin: 1498 ng/mL — ABNORMAL HIGH (ref 24–336)

## 2020-07-11 LAB — LACTIC ACID, PLASMA: Lactic Acid, Venous: 1.1 mmol/L (ref 0.5–1.9)

## 2020-07-11 LAB — ABO/RH: ABO/RH(D): O POS

## 2020-07-11 LAB — D-DIMER, QUANTITATIVE: D-Dimer, Quant: 2.16 ug/mL-FEU — ABNORMAL HIGH (ref 0.00–0.50)

## 2020-07-11 LAB — PHOSPHORUS: Phosphorus: 2.2 mg/dL — ABNORMAL LOW (ref 2.5–4.6)

## 2020-07-11 MED ORDER — FLUTICASONE PROPIONATE 50 MCG/ACT NA SUSP
1.0000 | Freq: Every day | NASAL | Status: DC
  Administered 2020-07-11 – 2020-07-24 (×14): 1 via NASAL
  Filled 2020-07-11 (×2): qty 16

## 2020-07-11 MED ORDER — METHYLPREDNISOLONE SODIUM SUCC 125 MG IJ SOLR
125.0000 mg | Freq: Once | INTRAMUSCULAR | Status: AC
  Administered 2020-07-11: 125 mg via INTRAVENOUS
  Filled 2020-07-11: qty 2

## 2020-07-11 MED ORDER — SODIUM CHLORIDE 0.9 % IV SOLN
100.0000 mg | Freq: Every day | INTRAVENOUS | Status: AC
  Administered 2020-07-12 – 2020-07-15 (×4): 100 mg via INTRAVENOUS
  Filled 2020-07-11 (×5): qty 20

## 2020-07-11 MED ORDER — SODIUM CHLORIDE 0.9 % IV SOLN
100.0000 mg | INTRAVENOUS | Status: AC
  Administered 2020-07-11 (×2): 100 mg via INTRAVENOUS
  Filled 2020-07-11: qty 20

## 2020-07-11 MED ORDER — POTASSIUM CHLORIDE CRYS ER 20 MEQ PO TBCR
40.0000 meq | EXTENDED_RELEASE_TABLET | Freq: Once | ORAL | Status: AC
  Administered 2020-07-11: 40 meq via ORAL
  Filled 2020-07-11: qty 2

## 2020-07-11 MED ORDER — LORATADINE 10 MG PO TABS
10.0000 mg | ORAL_TABLET | Freq: Every day | ORAL | Status: DC
  Administered 2020-07-11 – 2020-07-24 (×14): 10 mg via ORAL
  Filled 2020-07-11 (×14): qty 1

## 2020-07-11 MED ORDER — ENOXAPARIN SODIUM 40 MG/0.4ML ~~LOC~~ SOLN
40.0000 mg | Freq: Every day | SUBCUTANEOUS | Status: DC
  Administered 2020-07-11 – 2020-07-23 (×13): 40 mg via SUBCUTANEOUS
  Filled 2020-07-11 (×13): qty 0.4

## 2020-07-11 NOTE — ED Notes (Signed)
Pt to CT

## 2020-07-11 NOTE — Progress Notes (Signed)
PROGRESS NOTE    Kent Hernandez  NFA:[id number] DOB: 1986-06-02 DOA: 07/10/2020 PCP: Susy Frizzle, MD   Brief Narrative:  Per HPI: Kent Hernandez is a 34 y.o. male with medical history significant of allergic rhinitis, cervical DDD, GERD, H. pylori infection, hyperlipidemia, hypertension who is coming to the emergency department with a history of being exposed to his wife who developed URI symptoms before he did and then was subsequently diagnosed with COVID-19 on 07/01/2020 at [place] [facility].  He has been doing relatively well with just upper respiratory symptoms until he developed a dry cough, started feeling nauseous with some emesis and and at least 3 episodes of loose stools daily for the past few days.  He has been getting progressively dyspneic at home over the last few days.  He has monitoring his oxygen at home with a home pulse oximeter and noticed that his oxygen saturation was in the mid 80s.  He has had fever, chills, malaise, decreased appetite and difficulty sleeping.  He denies headache, wheezing, hemoptysis, chest pain, palpitations, diaphoresis, PND, orthopnea or pitting edema of the lower extremities.  No abdominal pain, constipation, melena or hematochezia.  Denies dysuria, frequency or hematuria.  No polyuria, polydipsia, polyphagia or blurred vision.  8/29: Patient has been admitted for acute hypoxemic respiratory failure secondary to COVID-19 pneumonia.  He is currently on 3.5 L nasal cannula oxygen this morning and does not appear to be short of breath.  His potassium levels are improving and he states that he has had diarrhea in the last few days.  Inflammatory markers are downtrending as well.  He is noted to have a liver mass that is suspicious for hemangioma with MRI recommended in the near future.  Patient is unvaccinated.  Assessment & Plan:   Principal Problem:   Pneumonia due to COVID-19 virus Active Problems:   GERD (gastroesophageal reflux disease)    Hyperlipidemia   Hypertension   Transaminitis   Hypercalcemia   Acute hypoxemic respiratory failure secondary to COVID-19 pneumonia -Continue on remdesivir as well as Solu-Medrol -Continue to monitor inflammatory markers with downward trend noted -Wean oxygen as tolerated -Symptomatic medications ordered -Contact precautions -CT chest with no sign of PE  Hypokalemia-improving likely secondary to recent diarrhea -Continue to replete and monitor a.m. labs with magnesium  Mild transaminitis likely related to Covid -Continue to monitor, currently downtrending -No nausea, vomiting or abdominal pain noted  GERD -Continue PPI  Dyslipidemia -Continue atorvastatin  Hypertension-stable -Continue metoprolol succinate 25 mg p.o. daily    DVT prophylaxis:Lovenox increased to twice daily dosing based on elevated D-dimer Code Status: Full Family Communication: Discussed with wife Vikki Ports on phone 8/29 Disposition Plan:  Status is: Inpatient  Remains inpatient appropriate because:IV treatments appropriate due to intensity of illness or inability to take PO and Inpatient level of care appropriate due to severity of illness   Dispo: The patient is from: Home              Anticipated d/c is to: Home              Anticipated d/c date is: 1 day              Patient currently is not medically stable to d/c.  Consultants:   None  Procedures:   See below  Antimicrobials:  Anti-infectives (From admission, onward)   Start     Dose/Rate Route Frequency Ordered Stop   07/12/20 1000  remdesivir 100 mg in sodium chloride 0.9 %  100 mL IVPB       "Followed by" Linked Group Details   100 mg 200 mL/hr over 30 Minutes Intravenous Daily 07/11/20 0028 07/16/20 0959   07/11/20 1000  remdesivir 100 mg in sodium chloride 0.9 % 100 mL IVPB  Status:  Discontinued       "Followed by" Linked Group Details   100 mg 200 mL/hr over 30 Minutes Intravenous Daily 07/10/20 2338 07/11/20 0027   07/11/20  0030  remdesivir 100 mg in sodium chloride 0.9 % 100 mL IVPB       "Followed by" Linked Group Details   100 mg 200 mL/hr over 30 Minutes Intravenous Every 30 min 07/11/20 0028 07/11/20 0210   07/10/20 2345  remdesivir 200 mg in sodium chloride 0.9% 250 mL IVPB  Status:  Discontinued       "Followed by" Linked Group Details   200 mg 580 mL/hr over 30 Minutes Intravenous Once 07/10/20 2338 07/11/20 0027      Subjective: Patient seen and evaluated today with no new acute complaints or concerns. No acute concerns or events noted overnight.  Objective: Vitals:   07/11/20 0700 07/11/20 0745 07/11/20 1030 07/11/20 1047  BP: (!) 143/97  (!) 133/98   Pulse: (!) 106 (!) 102 97 (!) 105  Resp: (!) 39 (!) 28 (!) 30 (!) 34  Temp:      TempSrc:      SpO2: (!) 89% 90%  91%  Weight:      Height:        Intake/Output Summary (Last 24 hours) at 07/11/2020 1052 Last data filed at 07/11/2020 0177 Gross per 24 hour  Intake 889.12 ml  Output 1000 ml  Net -110.88 ml   Filed Weights   07/10/20 2123  Weight: 71.7 kg    Examination:  General exam: Appears calm and comfortable  Respiratory system: Clear to auscultation. Respiratory effort normal.  Currently on 3.5 L nasal cannula oxygen.  No increased work of breathing noted. Cardiovascular system: S1 & S2 heard, RRR.  Gastrointestinal system: Abdomen is nondistended, soft and nontender.  Central nervous system: Alert and oriented. No focal neurological deficits. Extremities: Symmetric 5 x 5 power. Skin: No rashes, lesions or ulcers Psychiatry: Judgement and insight appear normal. Mood & affect appropriate.     Data Reviewed: I have personally reviewed following labs and imaging studies  CBC: Recent Labs  Lab 07/07/20 1044 07/10/20 2200 07/11/20 0845  WBC 3.5* 9.1 4.1  NEUTROABS 2.6 7.5 3.3  HGB 15.1 14.6 13.5  HCT 42.9 42.0 39.0  MCV 84.3 84.3 85.5  PLT 167 336 939   Basic Metabolic Panel: Recent Labs  Lab 07/07/20 1044  07/10/20 2200 07/11/20 0845  NA 129* 133* 138  K 3.0* 2.7* 3.4*  CL 93* 95* 104  CO2 23 26 23   GLUCOSE 107* 139* 156*  BUN 11 12 8   CREATININE 1.03 0.82 0.64  CALCIUM 8.0* 8.2* 7.5*  MG 2.0  --  2.1  PHOS  --   --  2.2*   GFR: Estimated Creatinine Clearance: 128.9 mL/min (by C-G formula based on SCr of 0.64 mg/dL). Liver Function Tests: Recent Labs  Lab 07/10/20 2200 07/11/20 0845  AST 114* 88*  ALT 76* 70*  ALKPHOS 55 48  BILITOT 0.9 1.0  PROT 7.0 6.3*  ALBUMIN 3.6 3.1*   No results for input(s): LIPASE, AMYLASE in the last 168 hours. No results for input(s): AMMONIA in the last 168 hours. Coagulation Profile: No results for input(s):  INR, PROTIME in the last 168 hours. Cardiac Enzymes: No results for input(s): CKTOTAL, CKMB, CKMBINDEX, TROPONINI in the last 168 hours. BNP (last 3 results) No results for input(s): PROBNP in the last 8760 hours. HbA1C: No results for input(s): HGBA1C in the last 72 hours. CBG: No results for input(s): GLUCAP in the last 168 hours. Lipid Profile: Recent Labs    07/10/20 2200  TRIG 128   Thyroid Function Tests: No results for input(s): TSH, T4TOTAL, FREET4, T3FREE, THYROIDAB in the last 72 hours. Anemia Panel: Recent Labs    07/10/20 2200 07/11/20 0845  FERRITIN 1,799* 1,498*   Sepsis Labs: Recent Labs  Lab 07/10/20 2200 07/10/20 2314  PROCALCITON <0.10  --   LATICACIDVEN 1.2 1.1    Recent Results (from the past 240 hour(s))  Blood Culture (routine x 2)     Status: None (Preliminary result)   Collection Time: 07/10/20 10:00 PM   Specimen: BLOOD  Result Value Ref Range Status   Specimen Description BLOOD LEFT ANTECUBITAL  Final   Special Requests   Final    BOTTLES DRAWN AEROBIC AND ANAEROBIC Blood Culture adequate volume   Culture   Final    NO GROWTH < 12 HOURS Performed at [facility], [address], [place], [place] [address]    Report Status PENDING  Incomplete  Blood Culture (routine x 2)     Status:  None (Preliminary result)   Collection Time: 07/10/20 11:14 PM   Specimen: BLOOD  Result Value Ref Range Status   Specimen Description BLOOD RIGHT ANTECUBITAL  Final   Special Requests   Final    BOTTLES DRAWN AEROBIC AND ANAEROBIC Blood Culture adequate volume   Culture   Final    NO GROWTH < 12 HOURS Performed at [facility], [address], [place], [place] [address]    Report Status PENDING  Incomplete  SARS Coronavirus 2 by RT PCR (hospital order, performed in [place] hospital lab) Nasopharyngeal Nasopharyngeal Swab     Status: Abnormal   Collection Time: 07/11/20  1:20 AM   Specimen: Nasopharyngeal Swab  Result Value Ref Range Status   SARS Coronavirus 2 POSITIVE (A) NEGATIVE Final    Comment: RESULT CALLED TO, READ BACK BY AND VERIFIED WITH: Andres Ege @[id number]  07/11/20 MKELLY (NOTE) SARS-CoV-2 target nucleic acids are DETECTED  SARS-CoV-2 RNA is generally detectable in upper respiratory specimens  during the acute phase of infection.  Positive results are indicative  of the presence of the identified virus, but do not rule out bacterial infection or co-infection with other pathogens not detected by the test.  Clinical correlation with patient history and  other diagnostic information is necessary to determine patient infection status.  The expected result is negative.  Fact Sheet for Patients:   StrictlyIdeas.no   Fact Sheet for Healthcare Providers:   BankingDealers.co.za    This test is not yet approved or cleared by the [place] FDA and  has been authorized for detection and/or diagnosis of SARS-CoV-2 by FDA under an Emergency Use Authorization (EUA).  This EUA will remain in effect (meaning this tes t can be used) for the duration of  the COVID-19 declaration under Section 564(b)(1) of the Act, 21 U.S.C. section 360-bbb-3(b)(1), unless the authorization is terminated or revoked sooner.  Performed at [facility], [address] [place], [place] [address]          Radiology Studies: CT Angio Chest PE W and/or Wo Contrast  Result Date: 07/11/2020 CLINICAL DATA:  34 year old male with concern for pulmonary embolism. Positive COVID-19. EXAM: CT ANGIOGRAPHY CHEST WITH CONTRAST TECHNIQUE: Multidetector CT imaging of the chest was performed using the standard protocol during bolus administration of intravenous contrast. Multiplanar CT image reconstructions and MIPs were obtained to evaluate the vascular anatomy. CONTRAST:  51mL OMNIPAQUE IOHEXOL 350 MG/ML SOLN COMPARISON:  Chest radiograph dated 07/07/2020. FINDINGS: Evaluation of this exam is limited due to respiratory motion artifact. Cardiovascular: There is no cardiomegaly or pericardial effusion. The thoracic aorta is unremarkable. Evaluation of the pulmonary arteries is very limited due to severe respiratory motion artifact. No large or central pulmonary artery embolus identified. Mediastinum/Nodes: Mildly enlarged right hilar lymph node measuring 13 mm in short axis. No mediastinal adenopathy. The esophagus and the thyroid gland are grossly unremarkable. No mediastinal fluid collection. Lungs/Pleura: Bilateral patchy ground-glass opacities with peripheral and subpleural distribution in keeping with COVID pneumonia. There is no pleural effusion pneumothorax. The central airways are patent. Upper Abdomen: Indeterminate ill-defined 3.4 x 2.8 cm left liver hypodense lesion, likely a hemangioma. Further characterization with MRI without and with contrast on a nonemergent/outpatient basis recommended. Musculoskeletal: No chest wall abnormality. No acute or significant osseous findings. Review of the MIP images confirms the above findings. IMPRESSION: 1. No CT evidence of central pulmonary artery embolus. 2. Bilateral patchy ground-glass opacities in keeping with COVID pneumonia. Clinical correlation and follow-up to resolution recommended. 3. Mildly enlarged  right hilar lymph node, likely reactive. 4. Indeterminate ill-defined left liver hypodense lesion, likely a hemangioma. Further characterization with MRI without and with contrast on a nonemergent/outpatient basis recommended. Electronically Signed   By: Anner Crete M.D.   On: 07/11/2020 00:38        Scheduled Meds: . albuterol  2 puff Inhalation Q6H  . vitamin C  500 mg Oral Daily  . atorvastatin  20 mg Oral Daily  . enoxaparin (LOVENOX) injection  40 mg Subcutaneous QHS  . methylPREDNISolone (SOLU-MEDROL) injection  0.5 mg/kg Intravenous Q12H  . metoprolol succinate  25 mg Oral Daily  . pantoprazole  40 mg Oral Daily  . zinc sulfate  220 mg Oral Daily   Continuous Infusions: . [START ON 07/12/2020] remdesivir 100 mg in NS 100 mL       LOS: 1 day    Time spent: 35 minutes    Ashyah Quizon Darleen Crocker, DO [facility]  If 7PM-7AM, please contact night-coverage [email or web address] 07/11/2020, 10:52 AM

## 2020-07-12 LAB — CBC WITH DIFFERENTIAL/PLATELET
Abs Immature Granulocytes: 0.09 10*3/uL — ABNORMAL HIGH (ref 0.00–0.07)
Basophils Absolute: 0 10*3/uL (ref 0.0–0.1)
Basophils Relative: 0 %
Eosinophils Absolute: 0 10*3/uL (ref 0.0–0.5)
Eosinophils Relative: 0 %
HCT: 40.4 % (ref 39.0–52.0)
Hemoglobin: 13.7 g/dL (ref 13.0–17.0)
Lymphocytes Relative: 11 %
Lymphs Abs: 1.4 10*3/uL (ref 0.7–4.0)
MCH: 29.5 pg (ref 26.0–34.0)
MCHC: 33.9 g/dL (ref 30.0–36.0)
MCV: 87.1 fL (ref 80.0–100.0)
Monocytes Absolute: 1 10*3/uL (ref 0.1–1.0)
Monocytes Relative: 8 %
Neutro Abs: 10.1 10*3/uL — ABNORMAL HIGH (ref 1.7–7.7)
Neutrophils Relative %: 81 %
Platelets: 492 10*3/uL — ABNORMAL HIGH (ref 150–400)
RBC: 4.64 MIL/uL (ref 4.22–5.81)
RDW: 12.4 % (ref 11.5–15.5)
WBC: 12.5 10*3/uL — ABNORMAL HIGH (ref 4.0–10.5)
nRBC: 0 % (ref 0.0–0.2)

## 2020-07-12 LAB — COMPREHENSIVE METABOLIC PANEL
ALT: 72 U/L — ABNORMAL HIGH (ref 0–44)
AST: 66 U/L — ABNORMAL HIGH (ref 15–41)
Albumin: 3.2 g/dL — ABNORMAL LOW (ref 3.5–5.0)
Alkaline Phosphatase: 48 U/L (ref 38–126)
Anion gap: 10 (ref 5–15)
BUN: 14 mg/dL (ref 6–20)
CO2: 25 mmol/L (ref 22–32)
Calcium: 8.3 mg/dL — ABNORMAL LOW (ref 8.9–10.3)
Chloride: 102 mmol/L (ref 98–111)
Creatinine, Ser: 0.71 mg/dL (ref 0.61–1.24)
GFR calc Af Amer: >60 mL/min (ref >60)
GFR calc non Af Amer: >60 mL/min (ref >60)
Glucose, Bld: 149 mg/dL — ABNORMAL HIGH (ref 70–99)
Potassium: 3.3 mmol/L — ABNORMAL LOW (ref 3.5–5.1)
Sodium: 137 mmol/L (ref 135–145)
Total Bilirubin: 1.2 mg/dL (ref 0.3–1.2)
Total Protein: 6.3 g/dL — ABNORMAL LOW (ref 6.5–8.1)

## 2020-07-12 LAB — FERRITIN: Ferritin: 1318 ng/mL — ABNORMAL HIGH (ref 24–336)

## 2020-07-12 LAB — D-DIMER, QUANTITATIVE: D-Dimer, Quant: 1.46 ug/mL-FEU — ABNORMAL HIGH (ref 0.00–0.50)

## 2020-07-12 LAB — MAGNESIUM: Magnesium: 2.4 mg/dL (ref 1.7–2.4)

## 2020-07-12 LAB — PHOSPHORUS: Phosphorus: 3.3 mg/dL (ref 2.5–4.6)

## 2020-07-12 LAB — C-REACTIVE PROTEIN: CRP: 2.3 mg/dL — ABNORMAL HIGH (ref <1.0)

## 2020-07-12 MED ORDER — POTASSIUM CHLORIDE CRYS ER 20 MEQ PO TBCR
40.0000 meq | EXTENDED_RELEASE_TABLET | Freq: Two times a day (BID) | ORAL | Status: AC
  Administered 2020-07-12 (×2): 40 meq via ORAL
  Filled 2020-07-12 (×2): qty 2

## 2020-07-12 MED ORDER — ALBUTEROL SULFATE HFA 108 (90 BASE) MCG/ACT IN AERS
2.0000 | INHALATION_SPRAY | Freq: Four times a day (QID) | RESPIRATORY_TRACT | Status: DC | PRN
  Administered 2020-07-13: 2 via RESPIRATORY_TRACT
  Filled 2020-07-12: qty 6.7

## 2020-07-12 NOTE — Progress Notes (Signed)
PROGRESS NOTE    Kent Hernandez  BCW:[id number] DOB: December 23, 1985 DOA: 07/10/2020 PCP: Susy Frizzle, MD   Brief Narrative:  Per HPI: Kent Hu Roachis a 34 y.o.malewith medical history significant ofallergic rhinitis, cervical DDD, GERD, H. pylori infection, hyperlipidemia, hypertension who is coming to the emergency department with a history of being exposed to his wife who developed URI symptoms before he did and then was subsequently diagnosed with COVID-19 on 07/01/2020 at [facility]. He has been doing relatively well with just upper respiratory symptoms until he developed a dry cough, started feeling nauseous with some emesis and and at least 3 episodes of loose stools daily for the past few days. He has been getting progressively dyspneic at home over the last few days. He has monitoring his oxygen at home with a home pulse oximeterand noticed that his oxygen saturation was in the mid 80s. He has had fever, chills, malaise, decreased appetite and difficulty sleeping. He denies headache, wheezing, hemoptysis, chest pain, palpitations, diaphoresis, PND, orthopnea or pitting edema of the lower extremities. No abdominal pain, constipation, melena or hematochezia. Denies dysuria, frequency or hematuria. No polyuria, polydipsia, polyphagia or blurred vision.  8/29: Patient has been admitted for acute hypoxemic respiratory failure secondary to COVID-19 pneumonia.  He is currently on 3.5 L nasal cannula oxygen this morning and does not appear to be short of breath.  His potassium levels are improving and he states that he has had diarrhea in the last few days.  Inflammatory markers are downtrending as well.  He is noted to have a liver mass that is suspicious for hemangioma with MRI recommended in the near future.  Patient is unvaccinated.  8/30: Patient still on 4 L nasal cannula oxygen without any significant diarrhea noted.  He denies any dyspnea or cough this morning.  He  appears to be feeling fairly well with downtrend in inflammatory markers noted.  He continues to have some hypokalemia requiring supplementation.  Assessment & Plan:   Principal Problem:   Pneumonia due to COVID-19 virus Active Problems:   GERD (gastroesophageal reflux disease)   Hyperlipidemia   Hypertension   Transaminitis   Hypercalcemia   Acute hypoxemic respiratory failure secondary to COVID-19 pneumonia-persistent -Continue on remdesivir as well as Solu-Medrol -Continue to monitor inflammatory markers with downward trend noted -Wean oxygen as tolerated -Symptomatic medications ordered -Contact precautions -CT chest with no sign of PE  Hypokalemia-improving likely secondary to recent diarrhea -Continue to replete and monitor a.m. labs with magnesium  Mild transaminitis likely related to Covid -Continue to monitor, currently downtrending -No nausea, vomiting or abdominal pain noted  GERD -Continue PPI  Dyslipidemia -Continue atorvastatin  Hypertension-stable -Continue metoprolol succinate 25 mg p.o. daily   DVT prophylaxis:Lovenox increased to twice daily dosing based on elevated D-dimer Code Status: Full Family Communication: Discussed with wife Kent Hernandez on phone 8/29, 8/30 Disposition Plan:  Status is: Inpatient  Remains inpatient appropriate because:IV treatments appropriate due to intensity of illness or inability to take PO and Inpatient level of care appropriate due to severity of illness   Dispo: The patient is from: Home  Anticipated d/c is to: Home  Anticipated d/c date is: 1 day  Patient currently is not medically stable to d/c.  Consultants:   None  Procedures:   See below  Antimicrobials:  Anti-infectives (From admission, onward)   Start     Dose/Rate Route Frequency Ordered Stop   07/12/20 1000  remdesivir 100 mg in sodium chloride 0.9 % 100  mL IVPB       "Followed by" Linked Group  Details   100 mg 200 mL/hr over 30 Minutes Intravenous Daily 07/11/20 0028 07/16/20 0959   07/11/20 1000  remdesivir 100 mg in sodium chloride 0.9 % 100 mL IVPB  Status:  Discontinued       "Followed by" Linked Group Details   100 mg 200 mL/hr over 30 Minutes Intravenous Daily 07/10/20 2338 07/11/20 0027   07/11/20 0030  remdesivir 100 mg in sodium chloride 0.9 % 100 mL IVPB       "Followed by" Linked Group Details   100 mg 200 mL/hr over 30 Minutes Intravenous Every 30 min 07/11/20 0028 07/11/20 0210   07/10/20 2345  remdesivir 200 mg in sodium chloride 0.9% 250 mL IVPB  Status:  Discontinued       "Followed by" Linked Group Details   200 mg 580 mL/hr over 30 Minutes Intravenous Once 07/10/20 2338 07/11/20 0027       Subjective: Patient seen and evaluated today with no new acute complaints or concerns. No acute concerns or events noted overnight.  He remains on 4 L nasal cannula oxygen, but overall appears to be feeling well.  Denies any diarrhea.  Objective: Vitals:   07/11/20 2157 07/12/20 0329 07/12/20 0532 07/12/20 0813  BP: 126/86 (!) 126/92 (!) 133/95   Pulse: 91 93 (!) 101   Resp: 18 16 18    Temp: 98.7 F (37.1 C) 97.9 F (36.6 C) 98.2 F (36.8 C)   TempSrc:      SpO2: (!) 89% (!) 87% (!) 88% 96%  Weight:      Height:       No intake or output data in the 24 hours ending 07/12/20 1110 Filed Weights   07/10/20 2123  Weight: 71.7 kg    Examination:  General exam: Appears calm and comfortable  Respiratory system: Clear to auscultation. Respiratory effort normal. 4L [place] Cardiovascular system: S1 & S2 heard, RRR.  Gastrointestinal system: Abdomen is nondistended, soft and nontender.  Central nervous system: Alert and oriented. No focal neurological deficits. Extremities: Symmetric 5 x 5 power. Skin: No rashes, lesions or ulcers Psychiatry: Judgement and insight appear normal. Mood & affect appropriate.     Data Reviewed: I have personally reviewed following  labs and imaging studies  CBC: Recent Labs  Lab 07/07/20 1044 07/10/20 2200 07/11/20 0845 07/12/20 0649  WBC 3.5* 9.1 4.1 12.5*  NEUTROABS 2.6 7.5 3.3 10.1*  HGB 15.1 14.6 13.5 13.7  HCT 42.9 42.0 39.0 40.4  MCV 84.3 84.3 85.5 87.1  PLT 167 336 343 347*   Basic Metabolic Panel: Recent Labs  Lab 07/07/20 1044 07/10/20 2200 07/11/20 0845 07/12/20 0649  NA 129* 133* 138 137  K 3.0* 2.7* 3.4* 3.3*  CL 93* 95* 104 102  CO2 23 26 23 25   GLUCOSE 107* 139* 156* 149*  BUN 11 12 8 14   CREATININE 1.03 0.82 0.64 0.71  CALCIUM 8.0* 8.2* 7.5* 8.3*  MG 2.0  --  2.1 2.4  PHOS  --   --  2.2* 3.3   GFR: Estimated Creatinine Clearance: 128.9 mL/min (by C-G formula based on SCr of 0.71 mg/dL). Liver Function Tests: Recent Labs  Lab 07/10/20 2200 07/11/20 0845 07/12/20 0649  AST 114* 88* 66*  ALT 76* 70* 72*  ALKPHOS 55 48 48  BILITOT 0.9 1.0 1.2  PROT 7.0 6.3* 6.3*  ALBUMIN 3.6 3.1* 3.2*   No results for input(s): LIPASE, AMYLASE in the  last 168 hours. No results for input(s): AMMONIA in the last 168 hours. Coagulation Profile: No results for input(s): INR, PROTIME in the last 168 hours. Cardiac Enzymes: No results for input(s): CKTOTAL, CKMB, CKMBINDEX, TROPONINI in the last 168 hours. BNP (last 3 results) No results for input(s): PROBNP in the last 8760 hours. HbA1C: No results for input(s): HGBA1C in the last 72 hours. CBG: No results for input(s): GLUCAP in the last 168 hours. Lipid Profile: Recent Labs    07/10/20 2200  TRIG 128   Thyroid Function Tests: No results for input(s): TSH, T4TOTAL, FREET4, T3FREE, THYROIDAB in the last 72 hours. Anemia Panel: Recent Labs    07/11/20 0845 07/12/20 0649  FERRITIN 1,498* 1,318*   Sepsis Labs: Recent Labs  Lab 07/10/20 2200 07/10/20 2314  PROCALCITON <0.10  --   LATICACIDVEN 1.2 1.1    Recent Results (from the past 240 hour(s))  Blood Culture (routine x 2)     Status: None (Preliminary result)    Collection Time: 07/10/20 10:00 PM   Specimen: BLOOD  Result Value Ref Range Status   Specimen Description BLOOD LEFT ANTECUBITAL  Final   Special Requests   Final    BOTTLES DRAWN AEROBIC AND ANAEROBIC Blood Culture adequate volume   Culture   Final    NO GROWTH 2 DAYS Performed at [facility], [address], [place], [place] [address]    Report Status PENDING  Incomplete  Blood Culture (routine x 2)     Status: None (Preliminary result)   Collection Time: 07/10/20 11:14 PM   Specimen: BLOOD  Result Value Ref Range Status   Specimen Description BLOOD RIGHT ANTECUBITAL  Final   Special Requests   Final    BOTTLES DRAWN AEROBIC AND ANAEROBIC Blood Culture adequate volume   Culture   Final    NO GROWTH 2 DAYS Performed at [facility], [address], [place], [place] [address]    Report Status PENDING  Incomplete  SARS Coronavirus 2 by RT PCR (hospital order, performed in [place] hospital lab) Nasopharyngeal Nasopharyngeal Swab     Status: Abnormal   Collection Time: 07/11/20  1:20 AM   Specimen: Nasopharyngeal Swab  Result Value Ref Range Status   SARS Coronavirus 2 POSITIVE (A) NEGATIVE Final    Comment: RESULT CALLED TO, READ BACK BY AND VERIFIED WITH: Andres Ege @[id number]  07/11/20 MKELLY (NOTE) SARS-CoV-2 target nucleic acids are DETECTED  SARS-CoV-2 RNA is generally detectable in upper respiratory specimens  during the acute phase of infection.  Positive results are indicative  of the presence of the identified virus, but do not rule out bacterial infection or co-infection with other pathogens not detected by the test.  Clinical correlation with patient history and  other diagnostic information is necessary to determine patient infection status.  The expected result is negative.  Fact Sheet for Patients:   StrictlyIdeas.no   Fact Sheet for Healthcare Providers:   BankingDealers.co.za    This test is not yet approved or  cleared by the [place] FDA and  has been authorized for detection and/or diagnosis of SARS-CoV-2 by FDA under an Emergency Use Authorization (EUA).  This EUA will remain in effect (meaning this tes t can be used) for the duration of  the COVID-19 declaration under Section 564(b)(1) of the Act, 21 U.S.C. section 360-bbb-3(b)(1), unless the authorization is terminated or revoked sooner.  Performed at [facility], [address] [place], [place] [address]  Radiology Studies: CT Angio Chest PE W and/or Wo Contrast  Result Date: 07/11/2020 CLINICAL DATA:  34 year old male with concern for pulmonary embolism. Positive COVID-19. EXAM: CT ANGIOGRAPHY CHEST WITH CONTRAST TECHNIQUE: Multidetector CT imaging of the chest was performed using the standard protocol during bolus administration of intravenous contrast. Multiplanar CT image reconstructions and MIPs were obtained to evaluate the vascular anatomy. CONTRAST:  36mL OMNIPAQUE IOHEXOL 350 MG/ML SOLN COMPARISON:  Chest radiograph dated 07/07/2020. FINDINGS: Evaluation of this exam is limited due to respiratory motion artifact. Cardiovascular: There is no cardiomegaly or pericardial effusion. The thoracic aorta is unremarkable. Evaluation of the pulmonary arteries is very limited due to severe respiratory motion artifact. No large or central pulmonary artery embolus identified. Mediastinum/Nodes: Mildly enlarged right hilar lymph node measuring 13 mm in short axis. No mediastinal adenopathy. The esophagus and the thyroid gland are grossly unremarkable. No mediastinal fluid collection. Lungs/Pleura: Bilateral patchy ground-glass opacities with peripheral and subpleural distribution in keeping with COVID pneumonia. There is no pleural effusion pneumothorax. The central airways are patent. Upper Abdomen: Indeterminate ill-defined 3.4 x 2.8 cm left liver hypodense lesion, likely a hemangioma. Further characterization with MRI without and with  contrast on a nonemergent/outpatient basis recommended. Musculoskeletal: No chest wall abnormality. No acute or significant osseous findings. Review of the MIP images confirms the above findings. IMPRESSION: 1. No CT evidence of central pulmonary artery embolus. 2. Bilateral patchy ground-glass opacities in keeping with COVID pneumonia. Clinical correlation and follow-up to resolution recommended. 3. Mildly enlarged right hilar lymph node, likely reactive. 4. Indeterminate ill-defined left liver hypodense lesion, likely a hemangioma. Further characterization with MRI without and with contrast on a nonemergent/outpatient basis recommended. Electronically Signed   By: Anner Crete M.D.   On: 07/11/2020 00:38        Scheduled Meds: . albuterol  2 puff Inhalation Q6H  . vitamin C  500 mg Oral Daily  . atorvastatin  20 mg Oral Daily  . enoxaparin (LOVENOX) injection  40 mg Subcutaneous QHS  . fluticasone  1 spray Each Nare Daily  . loratadine  10 mg Oral Daily  . methylPREDNISolone (SOLU-MEDROL) injection  0.5 mg/kg Intravenous Q12H  . metoprolol succinate  25 mg Oral Daily  . pantoprazole  40 mg Oral Daily  . potassium chloride  40 mEq Oral BID  . zinc sulfate  220 mg Oral Daily   Continuous Infusions: . remdesivir 100 mg in NS 100 mL       LOS: 2 days    Time spent: 30 minutes    Jaleel Allen Darleen Crocker, DO [facility]  If 7PM-7AM, please contact night-coverage [email or web address] 07/12/2020, 11:10 AM

## 2020-07-12 NOTE — Plan of Care (Signed)
  Problem: Education: Goal: Knowledge of risk factors and measures for prevention of condition will improve Outcome: Progressing   Problem: Respiratory: Goal: Will maintain a patent airway Outcome: Progressing Goal: Complications related to the disease process, condition or treatment will be avoided or minimized Outcome: Progressing   Problem: Health Behavior/Discharge Planning: Goal: Ability to manage health-related needs will improve Outcome: Progressing   Problem: Clinical Measurements: Goal: Respiratory complications will improve Outcome: Progressing   Problem: Activity: Goal: Risk for activity intolerance will decrease Outcome: Progressing   Problem: Nutrition: Goal: Adequate nutrition will be maintained Outcome: Progressing

## 2020-07-13 ENCOUNTER — Inpatient Hospital Stay (HOSPITAL_COMMUNITY): Payer: No Typology Code available for payment source

## 2020-07-13 LAB — CBC WITH DIFFERENTIAL/PLATELET
Abs Immature Granulocytes: 0.24 10*3/uL — ABNORMAL HIGH (ref 0.00–0.07)
Basophils Absolute: 0 10*3/uL (ref 0.0–0.1)
Basophils Relative: 0 %
Eosinophils Absolute: 0 10*3/uL (ref 0.0–0.5)
Eosinophils Relative: 0 %
HCT: 43.1 % (ref 39.0–52.0)
Hemoglobin: 14.3 g/dL (ref 13.0–17.0)
Immature Granulocytes: 1 %
Lymphocytes Relative: 6 %
Lymphs Abs: 1 10*3/uL (ref 0.7–4.0)
MCH: 29.1 pg (ref 26.0–34.0)
MCHC: 33.2 g/dL (ref 30.0–36.0)
MCV: 87.8 fL (ref 80.0–100.0)
Monocytes Absolute: 0.8 10*3/uL (ref 0.1–1.0)
Monocytes Relative: 5 %
Neutro Abs: 14.7 10*3/uL — ABNORMAL HIGH (ref 1.7–7.7)
Neutrophils Relative %: 88 %
Platelets: 592 10*3/uL — ABNORMAL HIGH (ref 150–400)
RBC: 4.91 MIL/uL (ref 4.22–5.81)
RDW: 12.3 % (ref 11.5–15.5)
WBC: 16.7 10*3/uL — ABNORMAL HIGH (ref 4.0–10.5)
nRBC: 0 % (ref 0.0–0.2)

## 2020-07-13 LAB — MAGNESIUM: Magnesium: 2.5 mg/dL — ABNORMAL HIGH (ref 1.7–2.4)

## 2020-07-13 LAB — COMPREHENSIVE METABOLIC PANEL
ALT: 67 U/L — ABNORMAL HIGH (ref 0–44)
AST: 43 U/L — ABNORMAL HIGH (ref 15–41)
Albumin: 3.3 g/dL — ABNORMAL LOW (ref 3.5–5.0)
Alkaline Phosphatase: 56 U/L (ref 38–126)
Anion gap: 10 (ref 5–15)
BUN: 19 mg/dL (ref 6–20)
CO2: 23 mmol/L (ref 22–32)
Calcium: 8.6 mg/dL — ABNORMAL LOW (ref 8.9–10.3)
Chloride: 104 mmol/L (ref 98–111)
Creatinine, Ser: 0.75 mg/dL (ref 0.61–1.24)
GFR calc Af Amer: >60 mL/min (ref >60)
GFR calc non Af Amer: >60 mL/min (ref >60)
Glucose, Bld: 142 mg/dL — ABNORMAL HIGH (ref 70–99)
Potassium: 4.1 mmol/L (ref 3.5–5.1)
Sodium: 137 mmol/L (ref 135–145)
Total Bilirubin: 1.4 mg/dL — ABNORMAL HIGH (ref 0.3–1.2)
Total Protein: 6.8 g/dL (ref 6.5–8.1)

## 2020-07-13 LAB — C-REACTIVE PROTEIN: CRP: 1.1 mg/dL — ABNORMAL HIGH (ref <1.0)

## 2020-07-13 LAB — PROCALCITONIN: Procalcitonin: <0.1 ng/mL

## 2020-07-13 LAB — PHOSPHORUS: Phosphorus: 4.2 mg/dL (ref 2.5–4.6)

## 2020-07-13 LAB — FERRITIN: Ferritin: 935 ng/mL — ABNORMAL HIGH (ref 24–336)

## 2020-07-13 LAB — D-DIMER, QUANTITATIVE: D-Dimer, Quant: 1.18 ug/mL-FEU — ABNORMAL HIGH (ref 0.00–0.50)

## 2020-07-13 MED ORDER — MUSCLE RUB 10-15 % EX CREA
1.0000 "application " | TOPICAL_CREAM | CUTANEOUS | Status: DC | PRN
  Filled 2020-07-13: qty 85

## 2020-07-13 MED ORDER — BARICITINIB 2 MG PO TABS
4.0000 mg | ORAL_TABLET | Freq: Every day | ORAL | Status: DC
  Administered 2020-07-13 – 2020-07-24 (×12): 4 mg via ORAL
  Filled 2020-07-13 (×12): qty 2

## 2020-07-13 MED ORDER — OXYCODONE HCL 5 MG PO TABS
5.0000 mg | ORAL_TABLET | Freq: Four times a day (QID) | ORAL | Status: DC | PRN
  Administered 2020-07-13: 5 mg via ORAL
  Filled 2020-07-13 (×2): qty 1

## 2020-07-13 NOTE — Progress Notes (Signed)
Wife concerned with what she sees as patient's growing O2 needs/desats with exertion.  She would like call from rounding on Wednesday please.

## 2020-07-13 NOTE — Progress Notes (Signed)
   07/13/20 1800  Document  Patient Outcome  (no changes)  expected outcome

## 2020-07-13 NOTE — Progress Notes (Signed)
PROGRESS NOTE    Kent Hernandez  GGY:[id number] DOB: 1985/11/28 DOA: 07/10/2020 PCP: Susy Frizzle, MD   Brief Narrative:  Per HPI: Kent Defino Roachis a 34 y.o.malewith medical history significant ofallergic rhinitis, cervical DDD, GERD, H. pylori infection, hyperlipidemia, hypertension who is coming to the emergency department with a history of being exposed to his wife who developed URI symptoms before he did and then was subsequently diagnosed with COVID-19 on 07/01/2020 at [facility]. He has been doing relatively well with just upper respiratory symptoms until he developed a dry cough, started feeling nauseous with some emesis and and at least 3 episodes of loose stools daily for the past few days. He has been getting progressively dyspneic at home over the last few days. He has monitoring his oxygen at home with a home pulse oximeterand noticed that his oxygen saturation was in the mid 80s. He has had fever, chills, malaise, decreased appetite and difficulty sleeping. He denies headache, wheezing, hemoptysis, chest pain, palpitations, diaphoresis, PND, orthopnea or pitting edema of the lower extremities. No abdominal pain, constipation, melena or hematochezia. Denies dysuria, frequency or hematuria. No polyuria, polydipsia, polyphagia or blurred vision.  8/29:Patient has been admitted for acute hypoxemic respiratory failure secondary to COVID-19 pneumonia. He is currently on 3.5 L nasal cannula oxygen this morning and does not appear to be short of breath. His potassium levels are improving and he states that he has had diarrhea in the last few days. Inflammatory markers are downtrending as well. He is noted to have a liver mass that is suspicious for hemangioma with MRI recommended in the near future. Patient is unvaccinated.  8/30: Patient still on 4 L nasal cannula oxygen without any significant diarrhea noted.  He denies any dyspnea or cough this morning.  He  appears to be feeling fairly well with downtrend in inflammatory markers noted.  He continues to have some hypokalemia requiring supplementation.  8/31: Patient remains on 4 L nasal cannula oxygen and desaturates into the 70th percentile with significant movement also has some dyspnea and mild fever.  He overall appears well at rest, but with movement seems to desaturate.  We will try to ambulate further this afternoon and monitor another day prior to considering discharge.  Assessment & Plan:   Principal Problem:   Pneumonia due to COVID-19 virus Active Problems:   GERD (gastroesophageal reflux disease)   Hyperlipidemia   Hypertension   Transaminitis   Hypercalcemia   Acute hypoxemic respiratory failure secondary to COVID-19 pneumonia-persistent -Continue on remdesivir as well as Solu-Medrol -Continue to monitor inflammatory markers with downward trend noted -Wean oxygen as tolerated, currently on 4 L at rest.  Significant hypoxemia with ambulation noted. -Symptomatic medications ordered -Contact precautions -CT chest with no sign of PE  Hypokalemia-resolved -Continue to monitor  Mild transaminitis likely related to Covid -Continue to monitor, currently downtrending -No nausea, vomiting or abdominal pain noted  GERD -Continue PPI  Dyslipidemia -Continue atorvastatin -LFTs with downward trend noted  Hypertension-stable -Continue metoprolol succinate 25 mg p.o. daily   DVT prophylaxis:Lovenoxincreased to twice daily dosing based on elevated D-dimer Code Status:Full Family Communication:Discussed with wife Vikki Ports on phone 8/29, 8/30, 8/31 Disposition Plan: Status is: Inpatient  Remains inpatient appropriate because:IV treatments appropriate due to intensity of illness or inability to take PO and Inpatient level of care appropriate due to severity of illness   Dispo: The patient is from:Home Anticipated d/c is  OJ:JKKX Anticipated d/c date is: 1-2 days Patient currently is  not medically stable to d/c.  Consultants:  None  Procedures:  See below  Antimicrobials:  Anti-infectives (From admission, onward)   Start     Dose/Rate Route Frequency Ordered Stop   07/12/20 1000  remdesivir 100 mg in sodium chloride 0.9 % 100 mL IVPB       "Followed by" Linked Group Details   100 mg 200 mL/hr over 30 Minutes Intravenous Daily 07/11/20 0028 07/16/20 0959   07/11/20 1000  remdesivir 100 mg in sodium chloride 0.9 % 100 mL IVPB  Status:  Discontinued       "Followed by" Linked Group Details   100 mg 200 mL/hr over 30 Minutes Intravenous Daily 07/10/20 2338 07/11/20 0027   07/11/20 0030  remdesivir 100 mg in sodium chloride 0.9 % 100 mL IVPB       "Followed by" Linked Group Details   100 mg 200 mL/hr over 30 Minutes Intravenous Every 30 min 07/11/20 0028 07/11/20 0210   07/10/20 2345  remdesivir 200 mg in sodium chloride 0.9% 250 mL IVPB  Status:  Discontinued       "Followed by" Linked Group Details   200 mg 580 mL/hr over 30 Minutes Intravenous Once 07/10/20 2338 07/11/20 0027       Subjective: Patient seen and evaluated today with no new acute complaints or concerns. No acute concerns or events noted overnight.  He is overall feeling well, but desaturates significantly with any movement.  Objective: Vitals:   07/12/20 1306 07/12/20 2120 07/12/20 2127 07/13/20 0551  BP: 134/90 (!) 128/91  127/85  Pulse: (!) 101 (!) 101  97  Resp: 18 20  18   Temp: 98.5 F (36.9 C) 99.2 F (37.3 C)  99 F (37.2 C)  TempSrc: Oral Oral    SpO2: 90% (!) 87% (!) 88% (!) 88%  Weight:      Height:        Intake/Output Summary (Last 24 hours) at 07/13/2020 1117 Last data filed at 07/12/2020 1730 Gross per 24 hour  Intake 330 ml  Output --  Net 330 ml   Filed Weights   07/10/20 2123  Weight: 71.7 kg    Examination:  General exam: Appears calm and comfortable   Respiratory system: Clear to auscultation. Respiratory effort normal.  Currently on 4 L nasal cannula oxygen. Cardiovascular system: S1 & S2 heard, RRR.  Gastrointestinal system: Abdomen is nondistended, soft and nontender.  Central nervous system: Alert and oriented. No focal neurological deficits. Extremities: Symmetric 5 x 5 power. Skin: No rashes, lesions or ulcers Psychiatry: Judgement and insight appear normal. Mood & affect appropriate.     Data Reviewed: I have personally reviewed following labs and imaging studies  CBC: Recent Labs  Lab 07/07/20 1044 07/10/20 2200 07/11/20 0845 07/12/20 0649 07/13/20 0641  WBC 3.5* 9.1 4.1 12.5* 16.7*  NEUTROABS 2.6 7.5 3.3 10.1* 14.7*  HGB 15.1 14.6 13.5 13.7 14.3  HCT 42.9 42.0 39.0 40.4 43.1  MCV 84.3 84.3 85.5 87.1 87.8  PLT 167 336 343 492* 409*   Basic Metabolic Panel: Recent Labs  Lab 07/07/20 1044 07/10/20 2200 07/11/20 0845 07/12/20 0649 07/13/20 0641  NA 129* 133* 138 137 137  K 3.0* 2.7* 3.4* 3.3* 4.1  CL 93* 95* 104 102 104  CO2 23 26 23 25 23   GLUCOSE 107* 139* 156* 149* 142*  BUN 11 12 8 14 19   CREATININE 1.03 0.82 0.64 0.71 0.75  CALCIUM 8.0* 8.2* 7.5* 8.3* 8.6*  MG 2.0  --  2.1 2.4 2.5*  PHOS  --   --  2.2* 3.3 4.2   GFR: Estimated Creatinine Clearance: 128.9 mL/min (by C-G formula based on SCr of 0.75 mg/dL). Liver Function Tests: Recent Labs  Lab 07/10/20 2200 07/11/20 0845 07/12/20 0649 07/13/20 0641  AST 114* 88* 66* 43*  ALT 76* 70* 72* 67*  ALKPHOS 55 48 48 56  BILITOT 0.9 1.0 1.2 1.4*  PROT 7.0 6.3* 6.3* 6.8  ALBUMIN 3.6 3.1* 3.2* 3.3*   No results for input(s): LIPASE, AMYLASE in the last 168 hours. No results for input(s): AMMONIA in the last 168 hours. Coagulation Profile: No results for input(s): INR, PROTIME in the last 168 hours. Cardiac Enzymes: No results for input(s): CKTOTAL, CKMB, CKMBINDEX, TROPONINI in the last 168 hours. BNP (last 3 results) No results for input(s):  PROBNP in the last 8760 hours. HbA1C: No results for input(s): HGBA1C in the last 72 hours. CBG: No results for input(s): GLUCAP in the last 168 hours. Lipid Profile: Recent Labs    07/10/20 2200  TRIG 128   Thyroid Function Tests: No results for input(s): TSH, T4TOTAL, FREET4, T3FREE, THYROIDAB in the last 72 hours. Anemia Panel: Recent Labs    07/12/20 0649 07/13/20 0641  FERRITIN 1,318* 935*   Sepsis Labs: Recent Labs  Lab 07/10/20 2200 07/10/20 2314  PROCALCITON <0.10  --   LATICACIDVEN 1.2 1.1    Recent Results (from the past 240 hour(s))  Blood Culture (routine x 2)     Status: None (Preliminary result)   Collection Time: 07/10/20 10:00 PM   Specimen: BLOOD  Result Value Ref Range Status   Specimen Description BLOOD LEFT ANTECUBITAL  Final   Special Requests   Final    BOTTLES DRAWN AEROBIC AND ANAEROBIC Blood Culture adequate volume   Culture   Final    NO GROWTH 3 DAYS Performed at [facility], [address] [place], [place] [address]    Report Status PENDING  Incomplete  Blood Culture (routine x 2)     Status: None (Preliminary result)   Collection Time: 07/10/20 11:14 PM   Specimen: BLOOD  Result Value Ref Range Status   Specimen Description BLOOD RIGHT ANTECUBITAL  Final   Special Requests   Final    BOTTLES DRAWN AEROBIC AND ANAEROBIC Blood Culture adequate volume   Culture   Final    NO GROWTH 3 DAYS Performed at [facility], [address] [place], [place] [address]    Report Status PENDING  Incomplete  SARS Coronavirus 2 by RT PCR (hospital order, performed in [facility] hospital lab) Nasopharyngeal Nasopharyngeal Swab     Status: Abnormal   Collection Time: 07/11/20  1:20 AM   Specimen: Nasopharyngeal Swab  Result Value Ref Range Status   SARS Coronavirus 2 POSITIVE (A) NEGATIVE Final    Comment: RESULT CALLED TO, READ BACK BY AND VERIFIED WITH: Andres Ege @0327  07/11/20 MKELLY (NOTE) SARS-CoV-2 target nucleic acids are  DETECTED  SARS-CoV-2 RNA is generally detectable in upper respiratory specimens  during the acute phase of infection.  Positive results are indicative  of the presence of the identified virus, but do not rule out bacterial infection or co-infection with other pathogens not detected by the test.  Clinical correlation with patient history and  other diagnostic information is necessary to determine patient infection status.  The expected result is negative.  Fact Sheet for Patients:   StrictlyIdeas.no   Fact Sheet for Healthcare Providers:   BankingDealers.co.za  This test is not yet approved or cleared by the [place] and  has been authorized for detection and/or diagnosis of SARS-CoV-2 by FDA under an Emergency Use Authorization (EUA).  This EUA will remain in effect (meaning this tes t can be used) for the duration of  the COVID-19 declaration under Section 564(b)(1) of the Act, 21 U.S.C. section 360-bbb-3(b)(1), unless the authorization is terminated or revoked sooner.  Performed at [facility], [address], [place], [place] [address]          Radiology Studies: No results found.      Scheduled Meds: . vitamin C  500 mg Oral Daily  . atorvastatin  20 mg Oral Daily  . enoxaparin (LOVENOX) injection  40 mg Subcutaneous QHS  . fluticasone  1 spray Each Nare Daily  . loratadine  10 mg Oral Daily  . methylPREDNISolone (SOLU-MEDROL) injection  0.5 mg/kg Intravenous Q12H  . metoprolol succinate  25 mg Oral Daily  . pantoprazole  40 mg Oral Daily  . zinc sulfate  220 mg Oral Daily   Continuous Infusions: . remdesivir 100 mg in NS 100 mL 100 mg (07/13/20 0940)     LOS: 3 days    Time spent: 30 minutes    Tamaj Kent Darleen Crocker, DO [facility]  If 7PM-7AM, please contact night-coverage [email or web address] 07/13/2020, 11:17 AM

## 2020-07-13 NOTE — Progress Notes (Signed)
SATURATION QUALIFICATIONS:   Patient Saturations on Room Air at Rest = N/A  Patient Saturations on Room Air while Ambulating = N/A  Patient Saturations on 4 Liters of oxygen while Ambulating = 77%  Please briefly explain why patient needs home oxygen: Patient is unable to complete rest or ambulation on  room air due to oxygen saturation dropping below 60%. Patient requires a minimum of 4 L to  maintain SPO2 of  85%  at rest.

## 2020-07-14 DIAGNOSIS — J1282 Pneumonia due to coronavirus disease 2019: Secondary | ICD-10-CM

## 2020-07-14 DIAGNOSIS — E871 Hypo-osmolality and hyponatremia: Secondary | ICD-10-CM

## 2020-07-14 DIAGNOSIS — E876 Hypokalemia: Secondary | ICD-10-CM

## 2020-07-14 DIAGNOSIS — J9601 Acute respiratory failure with hypoxia: Secondary | ICD-10-CM

## 2020-07-14 DIAGNOSIS — R7401 Elevation of levels of liver transaminase levels: Secondary | ICD-10-CM

## 2020-07-14 DIAGNOSIS — I10 Essential (primary) hypertension: Secondary | ICD-10-CM

## 2020-07-14 DIAGNOSIS — U071 COVID-19: Principal | ICD-10-CM

## 2020-07-14 DIAGNOSIS — K219 Gastro-esophageal reflux disease without esophagitis: Secondary | ICD-10-CM

## 2020-07-14 LAB — COMPREHENSIVE METABOLIC PANEL
ALT: 51 U/L — ABNORMAL HIGH (ref 0–44)
AST: 30 U/L (ref 15–41)
Albumin: 3.5 g/dL (ref 3.5–5.0)
Alkaline Phosphatase: 57 U/L (ref 38–126)
Anion gap: 14 (ref 5–15)
BUN: 16 mg/dL (ref 6–20)
CO2: 21 mmol/L — ABNORMAL LOW (ref 22–32)
Calcium: 8.8 mg/dL — ABNORMAL LOW (ref 8.9–10.3)
Chloride: 99 mmol/L (ref 98–111)
Creatinine, Ser: 0.67 mg/dL (ref 0.61–1.24)
GFR calc Af Amer: >60 mL/min (ref >60)
GFR calc non Af Amer: >60 mL/min (ref >60)
Glucose, Bld: 141 mg/dL — ABNORMAL HIGH (ref 70–99)
Potassium: 3.9 mmol/L (ref 3.5–5.1)
Sodium: 134 mmol/L — ABNORMAL LOW (ref 135–145)
Total Bilirubin: 1.7 mg/dL — ABNORMAL HIGH (ref 0.3–1.2)
Total Protein: 7.5 g/dL (ref 6.5–8.1)

## 2020-07-14 LAB — CBC WITH DIFFERENTIAL/PLATELET
Abs Immature Granulocytes: 0.29 10*3/uL — ABNORMAL HIGH (ref 0.00–0.07)
Basophils Absolute: 0.1 10*3/uL (ref 0.0–0.1)
Basophils Relative: 0 %
Eosinophils Absolute: 0 10*3/uL (ref 0.0–0.5)
Eosinophils Relative: 0 %
HCT: 43 % (ref 39.0–52.0)
Hemoglobin: 14.6 g/dL (ref 13.0–17.0)
Immature Granulocytes: 2 %
Lymphocytes Relative: 6 %
Lymphs Abs: 0.9 10*3/uL (ref 0.7–4.0)
MCH: 29.1 pg (ref 26.0–34.0)
MCHC: 34 g/dL (ref 30.0–36.0)
MCV: 85.8 fL (ref 80.0–100.0)
Monocytes Absolute: 0.7 10*3/uL (ref 0.1–1.0)
Monocytes Relative: 4 %
Neutro Abs: 13.7 10*3/uL — ABNORMAL HIGH (ref 1.7–7.7)
Neutrophils Relative %: 88 %
Platelets: 642 10*3/uL — ABNORMAL HIGH (ref 150–400)
RBC: 5.01 MIL/uL (ref 4.22–5.81)
RDW: 12.2 % (ref 11.5–15.5)
WBC: 15.6 10*3/uL — ABNORMAL HIGH (ref 4.0–10.5)
nRBC: 0 % (ref 0.0–0.2)

## 2020-07-14 LAB — BLOOD GAS, ARTERIAL
Acid-Base Excess: 2 mmol/L (ref 0.0–2.0)
Bicarbonate: 26.9 mmol/L (ref 20.0–28.0)
Drawn by: 27407
FIO2: 68
O2 Saturation: 81.6 %
Patient temperature: 37
pCO2 arterial: 27.6 mmHg — ABNORMAL LOW (ref 32.0–48.0)
pH, Arterial: 7.554 — ABNORMAL HIGH (ref 7.350–7.450)
pO2, Arterial: 44.3 mmHg — ABNORMAL LOW (ref 83.0–108.0)

## 2020-07-14 LAB — FERRITIN: Ferritin: 915 ng/mL — ABNORMAL HIGH (ref 24–336)

## 2020-07-14 LAB — PHOSPHORUS: Phosphorus: 4.1 mg/dL (ref 2.5–4.6)

## 2020-07-14 LAB — MAGNESIUM: Magnesium: 2.3 mg/dL (ref 1.7–2.4)

## 2020-07-14 LAB — C-REACTIVE PROTEIN: CRP: 9.3 mg/dL — ABNORMAL HIGH (ref <1.0)

## 2020-07-14 LAB — D-DIMER, QUANTITATIVE: D-Dimer, Quant: 1.71 ug/mL-FEU — ABNORMAL HIGH (ref 0.00–0.50)

## 2020-07-14 LAB — MRSA PCR SCREENING: MRSA by PCR: NEGATIVE

## 2020-07-14 LAB — PROCALCITONIN: Procalcitonin: <0.1 ng/mL

## 2020-07-14 MED ORDER — CHLORHEXIDINE GLUCONATE 0.12 % MT SOLN
15.0000 mL | Freq: Two times a day (BID) | OROMUCOSAL | Status: DC
  Administered 2020-07-14 – 2020-07-24 (×18): 15 mL via OROMUCOSAL
  Filled 2020-07-14 (×16): qty 15

## 2020-07-14 MED ORDER — CHLORHEXIDINE GLUCONATE CLOTH 2 % EX PADS
6.0000 | MEDICATED_PAD | Freq: Every day | CUTANEOUS | Status: DC
  Administered 2020-07-14 – 2020-07-20 (×7): 6 via TOPICAL

## 2020-07-14 MED ORDER — METHYLPREDNISOLONE SODIUM SUCC 125 MG IJ SOLR
80.0000 mg | Freq: Two times a day (BID) | INTRAMUSCULAR | Status: DC
  Administered 2020-07-14 – 2020-07-16 (×6): 80 mg via INTRAVENOUS
  Filled 2020-07-14 (×7): qty 2

## 2020-07-14 MED ORDER — METOPROLOL TARTRATE 25 MG PO TABS
25.0000 mg | ORAL_TABLET | Freq: Two times a day (BID) | ORAL | Status: DC
  Administered 2020-07-14 – 2020-07-24 (×20): 25 mg via ORAL
  Filled 2020-07-14 (×21): qty 1

## 2020-07-14 MED ORDER — ORAL CARE MOUTH RINSE
15.0000 mL | Freq: Two times a day (BID) | OROMUCOSAL | Status: DC
  Administered 2020-07-14 – 2020-07-23 (×13): 15 mL via OROMUCOSAL

## 2020-07-14 MED ORDER — SALINE SPRAY 0.65 % NA SOLN
1.0000 | NASAL | Status: DC | PRN
  Administered 2020-07-14: 1 via NASAL
  Filled 2020-07-14: qty 44

## 2020-07-14 MED ORDER — CYCLOBENZAPRINE HCL 5 MG PO TABS
7.5000 mg | ORAL_TABLET | Freq: Three times a day (TID) | ORAL | Status: DC | PRN
  Administered 2020-07-14: 7.5 mg via ORAL
  Filled 2020-07-14: qty 2

## 2020-07-14 NOTE — Consult Note (Signed)
NAME:  Kent Hernandez, MRN:  [id number], DOB:  04/27/1986, LOS: 4 ADMISSION DATE:  07/10/2020, CONSULTATION DATE:  07/14/2020 REFERRING MD:  Dr Cyndia Skeeters , CHIEF COMPLAINT:  Covid PNA, respiratory failure   Brief History   Patient with Covid pneumonia, worsening oxygen requirement, increased work of breathing  History of present illness   Apparently became sick following exposure to spouse who had URI symptoms Tested positive for Covid  Unvaccinated Past Medical History   Past Medical History:  Diagnosis Date  . Allergic rhinitis   . DDD (degenerative disc disease), cervical 2015  . Esophageal reflux   . GERD (gastroesophageal reflux disease)    Phreesia 04/12/2020  . H. pylori infection   . Hyperlipidemia    Phreesia 04/12/2020  . Hypertension    Phreesia 04/12/2020   Significant Hospital Events   Increasing oxygen requirement  Consults:  PCCM  Procedures:  None  Significant Diagnostic Tests:  CT chest IMPRESSION: 1. No CT evidence of central pulmonary artery embolus. 2. Bilateral patchy ground-glass opacities in keeping with COVID pneumonia. Clinical correlation and follow-up to resolution recommended. 3. Mildly enlarged right hilar lymph node, likely reactive. 4. Indeterminate ill-defined left liver hypodense lesion, likely a hemangioma. Further characterization with MRI without and with contrast on a nonemergent/outpatient basis recommended.  Micro Data:  Coronavirus positive Blood cultures 8/31 Antimicrobials:  On baricitinib, remdesivir  Interim history/subjective:  Increasing oxygen requirement Awake and alert Denies any pain or discomfort  Objective   Blood pressure (!) 132/94, pulse (!) 104, temperature 98.8 F (37.1 C), temperature source Oral, resp. rate (!) 22, height 5\' 9"  (1.753 m), weight 71.7 kg, SpO2 (!) 89 %.    FiO2 (%):  [100 %] 100 %   Intake/Output Summary (Last 24 hours) at 07/14/2020 1442 Last data filed at 07/14/2020 1300 Gross per 24  hour  Intake --  Output 550 ml  Net -550 ml   Filed Weights   07/10/20 2123  Weight: 71.7 kg    Examination: General: Middle-aged, does not appear to be in extremis but increased work of breathing, tachypnea HENT: Moist oral mucosa Lungs: Clear breath sounds anteriorly, rales at the bases Cardiovascular: S1-S2 appreciated Abdomen: Bowel sounds appreciated Extremities: No clubbing, no edema Neuro: Alert and oriented x3 GU:   ABG: 7.55/27/1944  CT scan of the chest reviewed by myself showing multifocal infiltrates Resolved Hospital Problem list     Assessment & Plan:  Acute hypoxic respiratory failure secondary to Covid pneumonia -Positive for the coronavirus -Elevated inflammatory markers -Worsening oxygen requirement  -On baricitinib -On steroids, increased today -On remdesivir -Other supportive measures -Encouraged to prone himself as tolerated  -We will continue monitoring inflammatory markers -VTE prophylaxis  Patient appears comfortable at present, achieving saturations of 90% on current oxygen flow Unless mental status starts to change or we cannot maintain saturations will intubation and ventilation be of benefit  Other active medical problems include  GERD Dyslipidemia Hypertension   Will continue to follow with you No other intervention is needed at present  Risk of further decompensation Continue to monitor closely  The patient is critically ill with multiple organ systems failure and requires high complexity decision making for assessment and support, frequent evaluation and titration of therapies, application of advanced monitoring technologies and extensive interpretation of multiple databases. Critical Care Time devoted to patient care services described in this note independent of APP/resident time (if applicable)  is 30 minutes.   Sherrilyn Rist MD [facility] Personal pager: #  [phone] If unanswered, please page CCM  On-call: [phone]

## 2020-07-14 NOTE — Progress Notes (Signed)
PROGRESS NOTE  Kent Hernandez EHM:[id number] DOB: 1986/09/20   PCP: Susy Frizzle, MD  Patient is from: Home  DOA: 07/10/2020 LOS: 4  Brief Narrative / Interim history: 34 year old male with history of cervical DDD, HTN, GERD, H. pylori infection and allergic rhinitis presenting with COVID-19 symptoms.  He tested positive on 07/01/2020 at [facility].  He was exposed to his wife who had URI symptoms before him.  In ED, he was hypoxic requiring 4 L.  Inflammatory markers elevated.  CTA chest negative for PE but bilateral groundglass opacities.  Patient was started on steroid, remdesivir and admitted.   Patient had increased oxygen requirement.  Baricitinib added on 07/13/2020.  He was transferred to [facility] as there was not SDU or ICU bed at [facility].  On arrival here, sitting in upper 80s to lower 90s 35 L / 100% HFNC and NRB.   Subjective: Patient continues to have shortness of breath, dry cough and some abdominal pain.  Some chest pain with cough.  Saturating at 88% on 35 L / 100% HFNC.  Objective: Vitals:   07/14/20 0744 07/14/20 1008 07/14/20 1010 07/14/20 1100  BP: (!) 130/91   (!) 150/98  Pulse: 100   (!) 102  Resp: (!) 22   (!) 30  Temp: 98 F (36.7 C)     TempSrc: Oral     SpO2: (!) 89% (!) 88% (!) 88% 92%  Weight:      Height:        Intake/Output Summary (Last 24 hours) at 07/14/2020 1208 Last data filed at 07/14/2020 0747 Gross per 24 hour  Intake --  Output 350 ml  Net -350 ml   Filed Weights   07/10/20 2123  Weight: 71.7 kg    Examination:  GENERAL: No apparent distress.  Nontoxic. HEENT: MMM.  Vision and hearing grossly intact.  NECK: Supple.  No apparent JVD.  RESP: 88% on 35 L / 100%.  No IWOB.  Crackles bilaterally. CVS:  RRR. Heart sounds normal.  ABD/GI/GU: BS+. Abd soft, NTND.  MSK/EXT:  Moves extremities. No apparent deformity. No edema.  SKIN: no apparent skin lesion or wound NEURO: Awake, alert and oriented appropriately.   No apparent focal neuro deficit. PSYCH: Calm. Normal affect.  Procedures:  None  Microbiology summarized: COVID-19 PCR positive Blood cultures negative.  Assessment & Plan: Acute respiratory failure with hypoxia due to COVID-19 pneumonia: Tested positive on 07/01/2020.  Increased inflammatory markers.  Bilateral groundglass opacities on CTA chest.  CTA negative for PE.  Patient continues to have increased oxygen needs from 4 L by [place] on admission to 35 L / 100% HFNC to maintain saturation in upper 80s to low 90s.  Inflammatory markers uptrending.  Procalcitonin negative. Recent Labs    07/12/20 0649 07/13/20 0641 07/14/20 0643  DDIMER 1.46* 1.18* 1.71*  FERRITIN 1,318* 935* 915*  CRP 2.3* 1.1* 9.3*  -Continue baricitinib 8/31>> -Increase Solu-Medrol to 80 mg twice daily -Continue remdesivir but doubt utility at baseline. -Supportive care with supplemental oxygen, inhalers, mucolytic's, antitussives, vit C, Zinc,  IS and proning while awake -Continue monitoring inflammatory markers -Subcu Lovenox for VTE prophylaxis.  Hypokalemia-resolved -Continue to monitor  Mild transaminitis likely related to Covid-improved.  Mild hyponatremia: -Continue monitoring.  GERD -Continue PPI  Dyslipidemia -Continue atorvastatin  Hypertension-stable -Continue metoprolol succinate 25 mg p.o. daily  Body mass index is 23.33 kg/m.         DVT prophylaxis:  enoxaparin (LOVENOX) injection 40 mg Start:  07/11/20 2200  Code Status: Full code Family Communication: Updated wife over the phone.  Status is: Inpatient  Remains inpatient appropriate because:IV treatments appropriate due to intensity of illness or inability to take PO, Inpatient level of care appropriate due to severity of illness and Respiratory failure with significant oxygen requirement   Dispo: The patient is from: Home              Anticipated d/c is to: Home              Anticipated d/c date is: > 3 days               Patient currently is not medically stable to d/c.       Consultants:  None   Sch Meds:  Scheduled Meds: . vitamin C  500 mg Oral Daily  . atorvastatin  20 mg Oral Daily  . baricitinib  4 mg Oral Daily  . chlorhexidine  15 mL Mouth Rinse BID  . Chlorhexidine Gluconate Cloth  6 each Topical Daily  . enoxaparin (LOVENOX) injection  40 mg Subcutaneous QHS  . fluticasone  1 spray Each Nare Daily  . loratadine  10 mg Oral Daily  . mouth rinse  15 mL Mouth Rinse q12n4p  . methylPREDNISolone (SOLU-MEDROL) injection  80 mg Intravenous BID  . metoprolol succinate  25 mg Oral Daily  . pantoprazole  40 mg Oral Daily  . zinc sulfate  220 mg Oral Daily   Continuous Infusions: . remdesivir 100 mg in NS 100 mL 100 mg (07/14/20 1112)   PRN Meds:.acetaminophen **OR** acetaminophen, albuterol, chlorpheniramine-HYDROcodone, guaiFENesin-dextromethorphan, Muscle Rub, ondansetron **OR** ondansetron (ZOFRAN) IV, oxyCODONE  Antimicrobials: Anti-infectives (From admission, onward)   Start     Dose/Rate Route Frequency Ordered Stop   07/12/20 1000  remdesivir 100 mg in sodium chloride 0.9 % 100 mL IVPB       "Followed by" Linked Group Details   100 mg 200 mL/hr over 30 Minutes Intravenous Daily 07/11/20 0028 07/16/20 0959   07/11/20 1000  remdesivir 100 mg in sodium chloride 0.9 % 100 mL IVPB  Status:  Discontinued       "Followed by" Linked Group Details   100 mg 200 mL/hr over 30 Minutes Intravenous Daily 07/10/20 2338 07/11/20 0027   07/11/20 0030  remdesivir 100 mg in sodium chloride 0.9 % 100 mL IVPB       "Followed by" Linked Group Details   100 mg 200 mL/hr over 30 Minutes Intravenous Every 30 min 07/11/20 0028 07/11/20 0210   07/10/20 2345  remdesivir 200 mg in sodium chloride 0.9% 250 mL IVPB  Status:  Discontinued       "Followed by" Linked Group Details   200 mg 580 mL/hr over 30 Minutes Intravenous Once 07/10/20 2338 07/11/20 0027       I have personally reviewed the  following labs and images: CBC: Recent Labs  Lab 07/10/20 2200 07/11/20 0845 07/12/20 0649 07/13/20 0641 07/14/20 0643  WBC 9.1 4.1 12.5* 16.7* 15.6*  NEUTROABS 7.5 3.3 10.1* 14.7* 13.7*  HGB 14.6 13.5 13.7 14.3 14.6  HCT 42.0 39.0 40.4 43.1 43.0  MCV 84.3 85.5 87.1 87.8 85.8  PLT 336 343 492* 592* 642*   BMP &GFR Recent Labs  Lab 07/10/20 2200 07/11/20 0845 07/12/20 0649 07/13/20 0641 07/14/20 0643  NA 133* 138 137 137 134*  K 2.7* 3.4* 3.3* 4.1 3.9  CL 95* 104 102 104 99  CO2 26 23 25 23  21*  GLUCOSE 139* 156* 149* 142* 141*  BUN 12 8 14 19 16   CREATININE 0.82 0.64 0.71 0.75 0.67  CALCIUM 8.2* 7.5* 8.3* 8.6* 8.8*  MG  --  2.1 2.4 2.5* 2.3  PHOS  --  2.2* 3.3 4.2 4.1   Estimated Creatinine Clearance: 128.9 mL/min (by C-G formula based on SCr of 0.67 mg/dL). Liver & Pancreas: Recent Labs  Lab 07/10/20 2200 07/11/20 0845 07/12/20 0649 07/13/20 0641 07/14/20 0643  AST 114* 88* 66* 43* 30  ALT 76* 70* 72* 67* 51*  ALKPHOS 55 48 48 56 57  BILITOT 0.9 1.0 1.2 1.4* 1.7*  PROT 7.0 6.3* 6.3* 6.8 7.5  ALBUMIN 3.6 3.1* 3.2* 3.3* 3.5   No results for input(s): LIPASE, AMYLASE in the last 168 hours. No results for input(s): AMMONIA in the last 168 hours. Diabetic: No results for input(s): HGBA1C in the last 72 hours. No results for input(s): GLUCAP in the last 168 hours. Cardiac Enzymes: No results for input(s): CKTOTAL, CKMB, CKMBINDEX, TROPONINI in the last 168 hours. No results for input(s): PROBNP in the last 8760 hours. Coagulation Profile: No results for input(s): INR, PROTIME in the last 168 hours. Thyroid Function Tests: No results for input(s): TSH, T4TOTAL, FREET4, T3FREE, THYROIDAB in the last 72 hours. Lipid Profile: No results for input(s): CHOL, HDL, LDLCALC, TRIG, CHOLHDL, LDLDIRECT in the last 72 hours. Anemia Panel: Recent Labs    07/13/20 0641 07/14/20 0643  FERRITIN 935* 915*   Urine analysis: No results found for: COLORURINE,  APPEARANCEUR, LABSPEC, PHURINE, GLUCOSEU, HGBUR, BILIRUBINUR, KETONESUR, PROTEINUR, UROBILINOGEN, NITRITE, LEUKOCYTESUR Sepsis Labs: Invalid input(s): PROCALCITONIN, [place]  Microbiology: Recent Results (from the past 240 hour(s))  Blood Culture (routine x 2)     Status: None (Preliminary result)   Collection Time: 07/10/20 10:00 PM   Specimen: BLOOD  Result Value Ref Range Status   Specimen Description BLOOD LEFT ANTECUBITAL  Final   Special Requests   Final    BOTTLES DRAWN AEROBIC AND ANAEROBIC Blood Culture adequate volume   Culture   Final    NO GROWTH 3 DAYS Performed at [facility], [address] [place], [place] [address]    Report Status PENDING  Incomplete  Blood Culture (routine x 2)     Status: None (Preliminary result)   Collection Time: 07/10/20 11:14 PM   Specimen: BLOOD  Result Value Ref Range Status   Specimen Description BLOOD RIGHT ANTECUBITAL  Final   Special Requests   Final    BOTTLES DRAWN AEROBIC AND ANAEROBIC Blood Culture adequate volume   Culture   Final    NO GROWTH 3 DAYS Performed at [facility], [address], [place], [place] [address]    Report Status PENDING  Incomplete  SARS Coronavirus 2 by RT PCR (hospital order, performed in [place] hospital lab) Nasopharyngeal Nasopharyngeal Swab     Status: Abnormal   Collection Time: 07/11/20  1:20 AM   Specimen: Nasopharyngeal Swab  Result Value Ref Range Status   SARS Coronavirus 2 POSITIVE (A) NEGATIVE Final    Comment: RESULT CALLED TO, READ BACK BY AND VERIFIED WITH: Andres Ege @[id number]  07/11/20 MKELLY (NOTE) SARS-CoV-2 target nucleic acids are DETECTED  SARS-CoV-2 RNA is generally detectable in upper respiratory specimens  during the acute phase of infection.  Positive results are indicative  of the presence of the identified virus, but do not rule out bacterial infection or co-infection with other pathogens not detected by the test.  Clinical correlation with patient history and  other diagnostic information is necessary to determine patient infection status.  The expected result is negative.  Fact Sheet for Patients:   StrictlyIdeas.no   Fact Sheet for Healthcare Providers:   BankingDealers.co.za    This test is not yet approved or cleared by the [place] FDA and  has been authorized for detection and/or diagnosis of SARS-CoV-2 by FDA under an Emergency Use Authorization (EUA).  This EUA will remain in effect (meaning this tes t can be used) for the duration of  the COVID-19 declaration under Section 564(b)(1) of the Act, 21 U.S.C. section 360-bbb-3(b)(1), unless the authorization is terminated or revoked sooner.  Performed at [facility], [address], [place], [place] [address]   Culture, blood (routine x 2)     Status: None (Preliminary result)   Collection Time: 07/13/20  7:21 PM   Specimen: Right Antecubital; Blood  Result Value Ref Range Status   Specimen Description RIGHT ANTECUBITAL  Final   Special Requests   Final    BOTTLES DRAWN AEROBIC AND ANAEROBIC Blood Culture adequate volume Performed at [facility], [address], [place], [place] [address]    Culture PENDING  Incomplete   Report Status PENDING  Incomplete  Culture, blood (routine x 2)     Status: None (Preliminary result)   Collection Time: 07/13/20  7:21 PM   Specimen: Right Antecubital; Blood  Result Value Ref Range Status   Specimen Description RIGHT ANTECUBITAL  Final   Special Requests   Final    BOTTLES DRAWN AEROBIC AND ANAEROBIC Blood Culture adequate volume Performed at [facility], [address] [place], [place] [address]    Culture PENDING  Incomplete   Report Status PENDING  Incomplete  MRSA PCR Screening     Status: None   Collection Time: 07/14/20 10:06 AM   Specimen: Nasal Mucosa; Nasopharyngeal  Result Value Ref Range Status   MRSA by PCR NEGATIVE NEGATIVE Final    Comment:        The GeneXpert MRSA  Assay (FDA approved for NASAL specimens only), is one component of a comprehensive MRSA colonization surveillance program. It is not intended to diagnose MRSA infection nor to guide or monitor treatment for MRSA infections. Performed at [facility], [place] [address] [place], [place] [address]     Radiology Studies: DG Chest Port 1 View  Result Date: 07/13/2020 CLINICAL DATA:  Fever, COVID pneumonia EXAM: PORTABLE CHEST 1 VIEW COMPARISON:  07/07/2020 FINDINGS: The lungs are well expanded and are symmetric though pulmonary insufflation has slightly diminished since prior examination. Extensive mid and lower lung zone airspace infiltrates are identified and have progressed since prior examination in keeping with changes of atypical infection. No pneumothorax or pleural effusion. Cardiac size within normal limits. Pulmonary vascularity is normal. No acute bone abnormality. IMPRESSION: Interval worsening of bilateral mid and lower lung zone airspace infiltrates in keeping with atypical infection. Electronically Signed   By: Fidela Salisbury MD   On: 07/13/2020 19:31      Kent Hernandez T. [place]  If 7PM-7AM, please contact night-coverage [email or web address] 07/14/2020, 12:08 PM

## 2020-07-14 NOTE — Progress Notes (Addendum)
Patient ID: Kent Hernandez, male   DOB: August 08, 1986, 34 y.o.   MRN: [id number]  PATIENT WITH WORSENING HYPOXIA DESPITE BEING ON HHF@15L /MIN/100%. ABG DONE THIS AM-RESULTS NOTED SHOWING HYPOXIA.PATIENT WILL REQUIRE HIGHER OXYGEN REQUIREMENT AND WILL BE TRANSFERRED TO STEPDOWN UNIT FOR FURTHER MANAGEMENT. TITRATE TO ACHIEVE O2 SATS>92% WIFE WAS UPDATED  ADDENDUM:  Due to unavailability of Stepdown bed and ICU bed at [facility], recommendations were for patient to be transferred to [facility] or [facility] COVID unit for further management.Awaitng transportation

## 2020-07-14 NOTE — Progress Notes (Signed)
Pt transferred to [facility] via Biomedical scientist by [employer]. Personal belongings sent with patient. Wife called and updated on pt transfer.

## 2020-07-14 NOTE — Progress Notes (Signed)
Entered room for shift assessment, pt lying on his back, 15l NRB and 15L HFNC intact. SaO2 89% per monitor. Pt states breathing, "OK right now." Resp regular, nonlabored while lying still. Pt with dry, hacking cough noted. With movement or talking, pt's SaO2 drops to 82-83%. Had pt assume left lateral side lying position and SaO2 up to 88%. Pt states this is more comfortable than prone. Denies any other c/o. Pt aware of pending transfer to [facility]

## 2020-07-14 NOTE — Progress Notes (Signed)
Pt has decreased O2 saturation between 82-84% at rest. RT notified. MD notified with new order for ABG to be obtained. MD made aware of ABG results and pt to be transferred to [facility]. Pt wife made aware of lab results and transfer.

## 2020-07-14 NOTE — Progress Notes (Signed)
Continuous pulse ox set up at bedside to monitor oxygen saturation. Currently 92% on 15L HFNC, NRB

## 2020-07-14 NOTE — Progress Notes (Signed)
Patient increased from 12L HFNC to 15L HFNC and NRB added per ABG results.   NASAL CANNULA    pH, Arterial 7.35 - 7.45 7.554High   pCO2 arterial 32 - 48 mmHg 27.6Low   pO2, Arterial 83 - 108 mmHg 44.3Low   Bicarbonate 20.0 - 28.0 mmol/L 26.9   Acid-Base Excess 0.0 - 2.0 mmol/L 2.0   O2 Saturation % 81.6

## 2020-07-15 DIAGNOSIS — R000 Tachycardia, unspecified: Secondary | ICD-10-CM

## 2020-07-15 LAB — CULTURE, BLOOD (ROUTINE X 2)
Culture: NO GROWTH
Culture: NO GROWTH
Special Requests: ADEQUATE
Special Requests: ADEQUATE

## 2020-07-15 LAB — COMPREHENSIVE METABOLIC PANEL
ALT: 42 U/L (ref 0–44)
AST: 26 U/L (ref 15–41)
Albumin: 3.3 g/dL — ABNORMAL LOW (ref 3.5–5.0)
Alkaline Phosphatase: 50 U/L (ref 38–126)
Anion gap: 12 (ref 5–15)
BUN: 19 mg/dL (ref 6–20)
CO2: 23 mmol/L (ref 22–32)
Calcium: 8.8 mg/dL — ABNORMAL LOW (ref 8.9–10.3)
Chloride: 100 mmol/L (ref 98–111)
Creatinine, Ser: 0.8 mg/dL (ref 0.61–1.24)
GFR calc Af Amer: >60 mL/min (ref >60)
GFR calc non Af Amer: >60 mL/min (ref >60)
Glucose, Bld: 157 mg/dL — ABNORMAL HIGH (ref 70–99)
Potassium: 4.5 mmol/L (ref 3.5–5.1)
Sodium: 135 mmol/L (ref 135–145)
Total Bilirubin: 1.6 mg/dL — ABNORMAL HIGH (ref 0.3–1.2)
Total Protein: 7.3 g/dL (ref 6.5–8.1)

## 2020-07-15 LAB — CBC WITH DIFFERENTIAL/PLATELET
Abs Immature Granulocytes: 0.19 10*3/uL — ABNORMAL HIGH (ref 0.00–0.07)
Basophils Absolute: 0 10*3/uL (ref 0.0–0.1)
Basophils Relative: 0 %
Eosinophils Absolute: 0 10*3/uL (ref 0.0–0.5)
Eosinophils Relative: 0 %
HCT: 43.1 % (ref 39.0–52.0)
Hemoglobin: 15 g/dL (ref 13.0–17.0)
Immature Granulocytes: 1 %
Lymphocytes Relative: 4 %
Lymphs Abs: 0.6 10*3/uL — ABNORMAL LOW (ref 0.7–4.0)
MCH: 29.3 pg (ref 26.0–34.0)
MCHC: 34.8 g/dL (ref 30.0–36.0)
MCV: 84.2 fL (ref 80.0–100.0)
Monocytes Absolute: 0.4 10*3/uL (ref 0.1–1.0)
Monocytes Relative: 2 %
Neutro Abs: 14 10*3/uL — ABNORMAL HIGH (ref 1.7–7.7)
Neutrophils Relative %: 93 %
Platelets: 571 10*3/uL — ABNORMAL HIGH (ref 150–400)
RBC: 5.12 MIL/uL (ref 4.22–5.81)
RDW: 12.2 % (ref 11.5–15.5)
WBC: 15.2 10*3/uL — ABNORMAL HIGH (ref 4.0–10.5)
nRBC: 0 % (ref 0.0–0.2)

## 2020-07-15 LAB — MAGNESIUM: Magnesium: 2.4 mg/dL (ref 1.7–2.4)

## 2020-07-15 LAB — PROCALCITONIN: Procalcitonin: <0.1 ng/mL

## 2020-07-15 LAB — PHOSPHORUS: Phosphorus: 3.6 mg/dL (ref 2.5–4.6)

## 2020-07-15 LAB — C-REACTIVE PROTEIN: CRP: 11.65 mg/dL — ABNORMAL HIGH (ref <1.0)

## 2020-07-15 LAB — D-DIMER, QUANTITATIVE: D-Dimer, Quant: 1.91 ug/mL-FEU — ABNORMAL HIGH (ref 0.00–0.50)

## 2020-07-15 LAB — FERRITIN: Ferritin: 1200 ng/mL — ABNORMAL HIGH (ref 24–336)

## 2020-07-15 MED ORDER — LORAZEPAM 0.5 MG PO TABS
0.5000 mg | ORAL_TABLET | Freq: Four times a day (QID) | ORAL | Status: DC | PRN
  Administered 2020-07-16: 0.5 mg via ORAL
  Filled 2020-07-15: qty 1

## 2020-07-15 NOTE — Progress Notes (Signed)
PROGRESS NOTE  Kent Hernandez:[id number] DOB: 1986-10-16   PCP: Susy Frizzle, MD  Patient is from: Home  DOA: 07/10/2020 LOS: 5  Brief Narrative / Interim history: 34 year old male with history of cervical DDD, HTN, GERD, H. pylori infection and allergic rhinitis presenting with COVID-19 symptoms.  He tested positive on 07/01/2020 at [facility].  He was exposed to his wife who had URI symptoms before him.  In ED, he was hypoxic requiring 4 L.  Inflammatory markers elevated.  CTA chest negative for PE but bilateral groundglass opacities.  Patient was started on steroid, remdesivir and admitted.   Patient had increased oxygen requirement.  Baricitinib added on 07/13/2020.  He was transferred to [facility] as there was not SDU or ICU bed at [facility].  On arrival here, sitting in upper 80s to lower 90s 35 L / 100% HFNC and NRB.   Subjective: No major events overnight of this morning.  Reports improvement in his breathing but saturating in the upper 80s to lower 90s on 40 L / 100% heated high flow nasal cannula with NRB.  Still with some dry cough.  Some chest pain with cough.  Denies GI symptoms.  Objective: Vitals:   07/15/20 0300 07/15/20 0400 07/15/20 0759 07/15/20 0800  BP: 126/88 109/65 136/83 137/89  Pulse: 88 76 (!) 105 85  Resp: 19 (!) 22 (!) 25 16  Temp:  97.8 F (36.6 C)    TempSrc:  Oral    SpO2: (!) 86% 92% 91% 91%  Weight:      Height:        Intake/Output Summary (Last 24 hours) at 07/15/2020 1122 Last data filed at 07/14/2020 2339 Gross per 24 hour  Intake 820 ml  Output 1425 ml  Net -605 ml   Filed Weights   07/10/20 2123  Weight: 71.7 kg    Examination: GENERAL: No apparent distress.  Nontoxic. HEENT: MMM.  Vision and hearing grossly intact.  NECK: Supple.  No apparent JVD.  RESP: 87% on 40 L / 90%.  No IWOB.  Fair aeration bilaterally. CVS:  RRR. Heart sounds normal.  ABD/GI/GU: BS+. Abd soft, NTND.  MSK/EXT:  Moves extremities. No  apparent deformity. No edema.  SKIN: no apparent skin lesion or wound NEURO: Awake, alert and oriented appropriately.  No apparent focal neuro deficit. PSYCH: Calm. Normal affect.  Procedures:  None  Microbiology summarized: COVID-19 PCR positive Blood cultures negative.  Assessment & Plan: Acute respiratory failure with hypoxia due to COVID-19 pneumonia: Tested positive on 07/01/2020.  Increased inflammatory markers.  CTA chest negative for PE but groundglass opacities.  Procalcitonin negative.Inflammatory markers uptrending.  Currently requiring 40 L / 100% by HFNC/NRB. Recent Labs    07/13/20 0641 07/14/20 0643 07/15/20 0251 07/15/20 0509  DDIMER 1.18* 1.71* 1.91*  --   FERRITIN 935* 915*  --  1,200*  CRP 1.1* 9.3* 11.65*  --   -Continue baricitinib 8/31>> -Increased Solu-Medrol to 80 mg twice daily 019/1 -Continue remdesivir but doubt utility at baseline. -Supplemental oxygen, inhalers, mucolytic's, antitussives, vit C, Zinc,  IS and proning while awake -Continue monitoring inflammatory markers -Subcu Lovenox for VTE prophylaxis.  Hypokalemia-resolved -Continue to monitor  Mild transaminitis likely related to COVID-19.  Improved.  Mild hyponatremia: Resolved. -Continue monitoring.  GERD -Continue PPI  Dyslipidemia -Continue atorvastatin  Hypertension/sinus tachycardia -stable -Continue metoprolol 25 mg twice daily  Body mass index is 23.33 kg/m.         DVT prophylaxis:  enoxaparin (  LOVENOX) injection 40 mg Start: 07/11/20 2200  Code Status: Full code Family Communication: Updated wife over the phone.  Status is: Inpatient  Remains inpatient appropriate because:IV treatments appropriate due to intensity of illness or inability to take PO, Inpatient level of care appropriate due to severity of illness and Respiratory failure with significant oxygen requirement   Dispo: The patient is from: Home              Anticipated d/c is to: Home               Anticipated d/c date is: > 3 days              Patient currently is not medically stable to d/c.       Consultants:  PCCM   Sch Meds:  Scheduled Meds: . vitamin C  500 mg Oral Daily  . atorvastatin  20 mg Oral Daily  . baricitinib  4 mg Oral Daily  . chlorhexidine  15 mL Mouth Rinse BID  . Chlorhexidine Gluconate Cloth  6 each Topical Daily  . enoxaparin (LOVENOX) injection  40 mg Subcutaneous QHS  . fluticasone  1 spray Each Nare Daily  . loratadine  10 mg Oral Daily  . mouth rinse  15 mL Mouth Rinse q12n4p  . methylPREDNISolone (SOLU-MEDROL) injection  80 mg Intravenous BID  . metoprolol tartrate  25 mg Oral BID  . pantoprazole  40 mg Oral Daily  . zinc sulfate  220 mg Oral Daily   Continuous Infusions:  PRN Meds:.acetaminophen **OR** acetaminophen, albuterol, chlorpheniramine-HYDROcodone, cyclobenzaprine, guaiFENesin-dextromethorphan, Muscle Rub, ondansetron **OR** ondansetron (ZOFRAN) IV, oxyCODONE, sodium chloride  Antimicrobials: Anti-infectives (From admission, onward)   Start     Dose/Rate Route Frequency Ordered Stop   07/12/20 1000  remdesivir 100 mg in sodium chloride 0.9 % 100 mL IVPB       "Followed by" Linked Group Details   100 mg 200 mL/hr over 30 Minutes Intravenous Daily 07/11/20 0028 07/15/20 0909   07/11/20 1000  remdesivir 100 mg in sodium chloride 0.9 % 100 mL IVPB  Status:  Discontinued       "Followed by" Linked Group Details   100 mg 200 mL/hr over 30 Minutes Intravenous Daily 07/10/20 2338 07/11/20 0027   07/11/20 0030  remdesivir 100 mg in sodium chloride 0.9 % 100 mL IVPB       "Followed by" Linked Group Details   100 mg 200 mL/hr over 30 Minutes Intravenous Every 30 min 07/11/20 0028 07/11/20 0210   07/10/20 2345  remdesivir 200 mg in sodium chloride 0.9% 250 mL IVPB  Status:  Discontinued       "Followed by" Linked Group Details   200 mg 580 mL/hr over 30 Minutes Intravenous Once 07/10/20 2338 07/11/20 0027       I have personally  reviewed the following labs and images: CBC: Recent Labs  Lab 07/11/20 0845 07/12/20 0649 07/13/20 0641 07/14/20 0643 07/15/20 0251  WBC 4.1 12.5* 16.7* 15.6* 15.2*  NEUTROABS 3.3 10.1* 14.7* 13.7* 14.0*  HGB 13.5 13.7 14.3 14.6 15.0  HCT 39.0 40.4 43.1 43.0 43.1  MCV 85.5 87.1 87.8 85.8 84.2  PLT 343 492* 592* 642* 571*   BMP &GFR Recent Labs  Lab 07/11/20 0845 07/12/20 0649 07/13/20 0641 07/14/20 0643 07/15/20 0251  NA 138 137 137 134* 135  K 3.4* 3.3* 4.1 3.9 4.5  CL 104 102 104 99 100  CO2 23 25 23  21* 23  GLUCOSE 156* 149* 142* 141*  157*  BUN 8 14 19 16 19   CREATININE 0.64 0.71 0.75 0.67 0.80  CALCIUM 7.5* 8.3* 8.6* 8.8* 8.8*  MG 2.1 2.4 2.5* 2.3 2.4  PHOS 2.2* 3.3 4.2 4.1 3.6   Estimated Creatinine Clearance: 128.9 mL/min (by C-G formula based on SCr of 0.8 mg/dL). Liver & Pancreas: Recent Labs  Lab 07/11/20 0845 07/12/20 0649 07/13/20 0641 07/14/20 0643 07/15/20 0251  AST 88* 66* 43* 30 26  ALT 70* 72* 67* 51* 42  ALKPHOS 48 48 56 57 50  BILITOT 1.0 1.2 1.4* 1.7* 1.6*  PROT 6.3* 6.3* 6.8 7.5 7.3  ALBUMIN 3.1* 3.2* 3.3* 3.5 3.3*   No results for input(s): LIPASE, AMYLASE in the last 168 hours. No results for input(s): AMMONIA in the last 168 hours. Diabetic: No results for input(s): HGBA1C in the last 72 hours. No results for input(s): GLUCAP in the last 168 hours. Cardiac Enzymes: No results for input(s): CKTOTAL, CKMB, CKMBINDEX, TROPONINI in the last 168 hours. No results for input(s): PROBNP in the last 8760 hours. Coagulation Profile: No results for input(s): INR, PROTIME in the last 168 hours. Thyroid Function Tests: No results for input(s): TSH, T4TOTAL, FREET4, T3FREE, THYROIDAB in the last 72 hours. Lipid Profile: No results for input(s): CHOL, HDL, LDLCALC, TRIG, CHOLHDL, LDLDIRECT in the last 72 hours. Anemia Panel: Recent Labs    07/14/20 0643 07/15/20 0509  FERRITIN 915* 1,200*   Urine analysis: No results found for:  COLORURINE, APPEARANCEUR, LABSPEC, PHURINE, GLUCOSEU, HGBUR, BILIRUBINUR, KETONESUR, PROTEINUR, UROBILINOGEN, NITRITE, LEUKOCYTESUR Sepsis Labs: Invalid input(s): PROCALCITONIN, [place]  Microbiology: Recent Results (from the past 240 hour(s))  Blood Culture (routine x 2)     Status: None   Collection Time: 07/10/20 10:00 PM   Specimen: BLOOD  Result Value Ref Range Status   Specimen Description BLOOD LEFT ANTECUBITAL  Final   Special Requests   Final    BOTTLES DRAWN AEROBIC AND ANAEROBIC Blood Culture adequate volume   Culture   Final    NO GROWTH 5 DAYS Performed at [facility], [address] [place], [place] [address]    Report Status 07/15/2020 FINAL  Final  Blood Culture (routine x 2)     Status: None   Collection Time: 07/10/20 11:14 PM   Specimen: BLOOD  Result Value Ref Range Status   Specimen Description BLOOD RIGHT ANTECUBITAL  Final   Special Requests   Final    BOTTLES DRAWN AEROBIC AND ANAEROBIC Blood Culture adequate volume   Culture   Final    NO GROWTH 5 DAYS Performed at [facility], [address] [place], [place] [address]    Report Status 07/15/2020 FINAL  Final  SARS Coronavirus 2 by RT PCR (hospital order, performed in [facility] hospital lab) Nasopharyngeal Nasopharyngeal Swab     Status: Abnormal   Collection Time: 07/11/20  1:20 AM   Specimen: Nasopharyngeal Swab  Result Value Ref Range Status   SARS Coronavirus 2 POSITIVE (A) NEGATIVE Final    Comment: RESULT CALLED TO, READ BACK BY AND VERIFIED WITH: Andres Ege @[id number]  07/11/20 MKELLY (NOTE) SARS-CoV-2 target nucleic acids are DETECTED  SARS-CoV-2 RNA is generally detectable in upper respiratory specimens  during the acute phase of infection.  Positive results are indicative  of the presence of the identified virus, but do not rule out bacterial infection or co-infection with other pathogens not detected by the test.  Clinical correlation with patient history and  other diagnostic  information is necessary to determine patient infection  status.  The expected result is negative.  Fact Sheet for Patients:   StrictlyIdeas.no   Fact Sheet for Healthcare Providers:   BankingDealers.co.za    This test is not yet approved or cleared by the [place] FDA and  has been authorized for detection and/or diagnosis of SARS-CoV-2 by FDA under an Emergency Use Authorization (EUA).  This EUA will remain in effect (meaning this tes t can be used) for the duration of  the COVID-19 declaration under Section 564(b)(1) of the Act, 21 U.S.C. section 360-bbb-3(b)(1), unless the authorization is terminated or revoked sooner.  Performed at [facility], [address], [place], [place] [address]   Culture, blood (routine x 2)     Status: None (Preliminary result)   Collection Time: 07/13/20  7:21 PM   Specimen: Right Antecubital; Blood  Result Value Ref Range Status   Specimen Description RIGHT ANTECUBITAL  Final   Special Requests   Final    BOTTLES DRAWN AEROBIC AND ANAEROBIC Blood Culture adequate volume   Culture   Final    NO GROWTH 2 DAYS Performed at [facility], [address], [place], [place] [address]    Report Status PENDING  Incomplete  Culture, blood (routine x 2)     Status: None (Preliminary result)   Collection Time: 07/13/20  7:21 PM   Specimen: Right Antecubital; Blood  Result Value Ref Range Status   Specimen Description RIGHT ANTECUBITAL  Final   Special Requests   Final    BOTTLES DRAWN AEROBIC AND ANAEROBIC Blood Culture adequate volume   Culture   Final    NO GROWTH 2 DAYS Performed at [facility], [address], [place], [place] [address]    Report Status PENDING  Incomplete  MRSA PCR Screening     Status: None   Collection Time: 07/14/20 10:06 AM   Specimen: Nasal Mucosa; Nasopharyngeal  Result Value Ref Range Status   MRSA by PCR NEGATIVE NEGATIVE Final    Comment:        The GeneXpert MRSA  Assay (FDA approved for NASAL specimens only), is one component of a comprehensive MRSA colonization surveillance program. It is not intended to diagnose MRSA infection nor to guide or monitor treatment for MRSA infections. Performed at [facility], [place] [address], [place], [place] [address]     Radiology Studies: No results found.    Ayad Nieman T. Bremond  If 7PM-7AM, please contact night-coverage [email or web address] 07/15/2020, 11:22 AM

## 2020-07-15 NOTE — TOC Initial Note (Signed)
Transition of Care [facility]) - Initial/Assessment Note    Patient Details  Name: ANES RIGEL MRN: [id number] Date of Birth: 1986/10/22  Transition of Care [facility]) CM/SW Contact:    Leeroy Cha, RN Phone Number: 07/15/2020, 8:49 AM  Clinical Narrative:                 Patient admitted for covid 19 resp failure, hfnrbm at 40l/min, iv solumedrol,iv remdesivir through [phone]. Plan is to return home,wife is at home with covid symptoms also.  Expected Discharge Plan: Home/Self Care Barriers to Discharge: Continued Medical Work up   Patient Goals and CMS Choice Patient states their goals for this hospitalization and ongoing recovery are:: to go home CMS Medicare.gov Compare Post Acute Care list provided to:: Patient Choice offered to / list presented to : Patient  Expected Discharge Plan and Services Expected Discharge Plan: Home/Self Care   Discharge Planning Services: CM Consult   Living arrangements for the past 2 months: Single Family Home                                      Prior Living Arrangements/Services Living arrangements for the past 2 months: Single Family Home Lives with:: Spouse Patient language and need for interpreter reviewed:: Yes Do you feel safe going back to the place where you live?: Yes      Need for Family Participation in Patient Care: Yes (Comment) Care giver support system in place?: Yes (comment)   Criminal Activity/Legal Involvement Pertinent to Current Situation/Hospitalization: No - Comment as needed  Activities of Daily Living Home Assistive Devices/Equipment: None ADL Screening (condition at time of admission) Patient's cognitive ability adequate to safely complete daily activities?: Yes Is the patient deaf or have difficulty hearing?: No Does the patient have difficulty seeing, even when wearing glasses/contacts?: No Does the patient have difficulty concentrating, remembering, or making decisions?: No Patient able to express  need for assistance with ADLs?: Yes Does the patient have difficulty dressing or bathing?: No Independently performs ADLs?: Yes (appropriate for developmental age) Does the patient have difficulty walking or climbing stairs?: No Weakness of Legs: None Weakness of Arms/Hands: None  Permission Sought/Granted                  Emotional Assessment Appearance:: Appears stated age Attitude/Demeanor/Rapport: Engaged Affect (typically observed): Calm Orientation: : Oriented to Place, Oriented to Self, Oriented to  Time, Oriented to Situation Alcohol / Substance Use: Not Applicable Psych Involvement: No (comment)  Admission diagnosis:  Hypoxia [R09.02] COVID-19 virus infection [U07.1] Pneumonia due to COVID-19 virus [U07.1, J12.82] Patient Active Problem List   Diagnosis Date Noted  . Transaminitis 07/11/2020  . Hypercalcemia 07/11/2020  . Pneumonia due to COVID-19 virus 07/10/2020  . Hyperlipidemia   . Hypertension   . DDD (degenerative disc disease), cervical 08/09/2016  . Skin lesion 08/04/2013  . Genital warts   . Allergic rhinitis   . GERD (gastroesophageal reflux disease) 07/11/2011  . Dysphagia, unspecified(787.20) 06/12/2011  . Esophageal reflux 06/12/2011   PCP:  Susy Frizzle, MD Pharmacy:   [id number] - , [address] [place] [address] Phone: [phone] Fax: [place], [address] [place] [address] Phone: [phone] Fax: [place], [address] [place]  [place] [address] Phone: [phone] Fax: [phone]     Social Determinants of Health (SDOH) Interventions    Readmission Risk Interventions No flowsheet data found.

## 2020-07-15 NOTE — Progress Notes (Signed)
   NAME:  Kent Hernandez, MRN:  [id number], DOB:  05-22-1986, LOS: 5 ADMISSION DATE:  07/10/2020, CONSULTATION DATE:  07/14/2020 REFERRING MD: Dr. Cyndia Skeeters, CHIEF COMPLAINT: Covid pneumonia, respiratory failure  Brief History   Patient with Covid pneumonia, worsening oxygen requirement, increased work of breathing  History of present illness   Contracted Covid probably from spouse about URI symptoms prior Spouse did test positive Covid as well  He was not vaccinated Past Medical History   Past Medical History:  Diagnosis Date  . Allergic rhinitis   . DDD (degenerative disc disease), cervical 2015  . Esophageal reflux   . GERD (gastroesophageal reflux disease)    Phreesia 04/12/2020  . H. pylori infection   . Hyperlipidemia    Phreesia 04/12/2020  . Hypertension    Phreesia 04/12/2020   Significant Hospital Events   Increasing oxygen requirement  Consults:  pccm  Procedures:  None  Significant Diagnostic Tests:  CT scan of the chest IMPRESSION: 1. No CT evidence of central pulmonary artery embolus. 2. Bilateral patchy ground-glass opacities in keeping with COVID pneumonia. Clinical correlation and follow-up to resolution recommended. 3. Mildly enlarged right hilar lymph node, likely reactive. 4. Indeterminate ill-defined left liver hypodense lesion, likely a hemangioma. Further characterization with MRI without and with contrast on a nonemergent/outpatient basis recommended. Micro Data:  Coronavirus positive Blood cultures 8/31 - to date  Antimicrobials:  Baricitinib Remdesivir  Interim history/subjective:  Increasing oxygen requirement Awake and alert Feels a little bit better today generally  Objective   Blood pressure 137/89, pulse 85, temperature 97.8 F (36.6 C), temperature source Oral, resp. rate 16, height 5\' 9"  (1.753 m), weight 71.7 kg, SpO2 91 %.    FiO2 (%):  [90 %-100 %] 90 %   Intake/Output Summary (Last 24 hours) at 07/15/2020 1001 Last data  filed at 07/14/2020 2339 Gross per 24 hour  Intake 820 ml  Output 1425 ml  Net -605 ml   Filed Weights   07/10/20 2123  Weight: 71.7 kg    Examination: General: Young gentleman, does not appear acutely ill, tachypneic HENT: Moist oral mucosa Lungs: Clear breath sounds anteriorly, rales at the bases Cardiovascular: S1-S2 appreciated Abdomen: Bowel sounds appreciated Extremities: No clubbing, no edema Neuro: Alert and oriented x3 GU:   Resolved Hospital Problem list     Assessment & Plan:   Acute hypoxic respiratory failure secondary to Covid pneumonia -Positive for coronavirus -Elevated inflammatory markers -Worsening oxygen requirement -On baricitinib -On steroids -On remdesivir -Continue other supportive measures -Encourage declining status tolerated  -Continue VTE prophylaxis -Monitor inflammatory markers  He does appear comfortable Mentating well -Intubation as a last resort only if unable to maintain his saturations with mental status changes  Other medical problems include GERD Dyslipidemia Hypertension  We will continue to follow with you No other changes at present  Risk of decompensation remains high  The patient is critically ill with multiple organ systems failure and requires high complexity decision making for assessment and support, frequent evaluation and titration of therapies, application of advanced monitoring technologies and extensive interpretation of multiple databases. Critical Care Time devoted to patient care services described in this note independent of APP/resident time (if applicable)  is 30 minutes.   Sherrilyn Rist MD [facility] Personal pager: [phone] If unanswered, please page CCM On-call: [phone]

## 2020-07-15 NOTE — Progress Notes (Signed)
Called patient's wife, Vikki Ports, per husband's request to give update on patient's progress. I left number in case she had additional questions or concerns.

## 2020-07-16 LAB — D-DIMER, QUANTITATIVE: D-Dimer, Quant: 1.9 ug/mL-FEU — ABNORMAL HIGH (ref 0.00–0.50)

## 2020-07-16 NOTE — Progress Notes (Signed)
Patient sat up to side of bed to take bed bath. When patient stood up halfway through, O2 decreased to 50% and had prolong recover time. O2 increased to 80% after 15 minutes. 0.5 mg of Ativan administered to help with anxiety after bath. RN will continue to monitor.

## 2020-07-16 NOTE — Plan of Care (Signed)
  Problem: Education: Goal: Knowledge of risk factors and measures for prevention of condition will improve Outcome: Progressing   Problem: Coping: Goal: Psychosocial and spiritual needs will be supported Outcome: Progressing   Problem: Respiratory: Goal: Will maintain a patent airway Outcome: Progressing Goal: Complications related to the disease process, condition or treatment will be avoided or minimized Outcome: Progressing   Problem: Education: Goal: Knowledge of risk factors and measures for prevention of condition will improve Outcome: Progressing   Problem: Coping: Goal: Psychosocial and spiritual needs will be supported Outcome: Progressing   Problem: Respiratory: Goal: Will maintain a patent airway Outcome: Progressing Goal: Complications related to the disease process, condition or treatment will be avoided or minimized Outcome: Progressing   Problem: Education: Goal: Knowledge of General Education information will improve Description: Including pain rating scale, medication(s)/side effects and non-pharmacologic comfort measures Outcome: Progressing   Problem: Health Behavior/Discharge Planning: Goal: Ability to manage health-related needs will improve Outcome: Progressing   Problem: Clinical Measurements: Goal: Ability to maintain clinical measurements within normal limits will improve Outcome: Progressing Goal: Will remain free from infection Outcome: Progressing Goal: Diagnostic test results will improve Outcome: Progressing Goal: Respiratory complications will improve Outcome: Progressing Goal: Cardiovascular complication will be avoided Outcome: Progressing   Problem: Activity: Goal: Risk for activity intolerance will decrease Outcome: Progressing   Problem: Nutrition: Goal: Adequate nutrition will be maintained Outcome: Progressing   Problem: Coping: Goal: Level of anxiety will decrease Outcome: Progressing   Problem: Elimination: Goal:  Will not experience complications related to bowel motility Outcome: Progressing Goal: Will not experience complications related to urinary retention Outcome: Progressing   Problem: Pain Managment: Goal: General experience of comfort will improve Outcome: Progressing   Problem: Safety: Goal: Ability to remain free from injury will improve Outcome: Progressing   Problem: Skin Integrity: Goal: Risk for impaired skin integrity will decrease Outcome: Progressing   

## 2020-07-16 NOTE — Progress Notes (Signed)
   NAME:  Kent Hernandez, MRN:  [id number], DOB:  07/30/1986, LOS: 6 ADMISSION DATE:  07/10/2020, CONSULTATION DATE:  07/14/2020 REFERRING MD: Dr. Cyndia Skeeters, CHIEF COMPLAINT: Covid pneumonia, respiratory failure  Brief History   Patient with Covid pneumonia, worsening oxygen requirement, increased work of breathing  History of present illness   Contracted Covid probably from spouse about URI symptoms prior Spouse did test positive Covid as well  He was not vaccinated Past Medical History   Past Medical History:  Diagnosis Date  . Allergic rhinitis   . DDD (degenerative disc disease), cervical 2015  . Esophageal reflux   . GERD (gastroesophageal reflux disease)    Phreesia 04/12/2020  . H. pylori infection   . Hyperlipidemia    Phreesia 04/12/2020  . Hypertension    Phreesia 04/12/2020   Significant Hospital Events   Increasing oxygen requirement  Consults:  pccm  Procedures:  None  Significant Diagnostic Tests:  CT scan of the chest IMPRESSION: 1. No CT evidence of central pulmonary artery embolus. 2. Bilateral patchy ground-glass opacities in keeping with COVID pneumonia. Clinical correlation and follow-up to resolution recommended. 3. Mildly enlarged right hilar lymph node, likely reactive. 4. Indeterminate ill-defined left liver hypodense lesion, likely a hemangioma. Further characterization with MRI without and with contrast on a nonemergent/outpatient basis recommended. Micro Data:  Coronavirus positive Blood cultures 8/31 - to date  Antimicrobials:  Baricitinib Remdesivir  Interim history/subjective:  Increasing oxygen requirement He feels a little bit better today Oxygen requirement did improve a little bit  Objective   Blood pressure 118/76, pulse 65, temperature 97.8 F (36.6 C), temperature source Oral, resp. rate 20, height 5\' 9"  (1.753 m), weight 71.7 kg, SpO2 94 %.    FiO2 (%):  [100 %] 100 %   Intake/Output Summary (Last 24 hours) at 07/16/2020  1505 Last data filed at 07/16/2020 0900 Gross per 24 hour  Intake --  Output 1930 ml  Net -1930 ml   Filed Weights   07/10/20 2123  Weight: 71.7 kg    Examination: General: Young gentleman, does not appear to be in distress HENT: Moist oral mucosa Lungs: Does have rales at the bases Cardiovascular: S1-S2 appreciated Abdomen: Bowel sounds appreciated Extremities: No clubbing, no edema  Resolved Hospital Problem list     Assessment & Plan:  Acute hypoxic respiratory failure secondary to Covid pneumonia -Continue all supportive measures -On steroids -On baricitinib -On remdesivir -Continue VT prophylaxis  History of hypertension Dyslipidemia GERD  Appears to be slowly stabilizing Mentating well  PCCM will sign off at present  Call as needed  Sherrilyn Rist, MD [place] [facility] Pager: [phone]

## 2020-07-16 NOTE — Progress Notes (Signed)
PROGRESS NOTE  Kent Hernandez KNL:[id number] DOB: [birth year]   PCP: Susy Frizzle, MD  Patient is from: Home  DOA: 07/10/2020 LOS: 6  Brief Narrative / Interim history: 34 year old male with history of cervical DDD, HTN, GERD, H. pylori infection and allergic rhinitis presenting with COVID-19 symptoms.  He tested positive on 07/01/2020 at [facility].  He was exposed to his wife who had URI symptoms before him.  In ED, he was hypoxic requiring 4 L.  Inflammatory markers elevated.  CTA chest negative for PE but bilateral groundglass opacities.  Patient was started on steroid, remdesivir and admitted.   Patient had increased oxygen requirement.  Baricitinib added on 07/13/2020.  He was transferred to [facility] as there was not SDU or ICU bed at [facility].  On arrival here, sitting in upper 80s to lower 90s 35 L / 100% HFNC and NRB.  Solu-Medrol dose increased.  PCCM consulted.  Patient is now on 15 L HFNC/NRB  Subjective: Seen and examined earlier this morning.  No major events overnight of this morning.  No complaints.  Feels better.  Breathing improved.  He is now on 15 L by HFNC/NRB.  Objective: Vitals:   07/16/20 0717 07/16/20 0753 07/16/20 0800 07/16/20 0914  BP:   122/78   Pulse: 74  77   Resp: 18  20   Temp:    97.6 F (36.4 C)  TempSrc:    Oral  SpO2: 90% (!) 86% 90%   Weight:      Height:        Intake/Output Summary (Last 24 hours) at 07/16/2020 1200 Last data filed at 07/16/2020 0900 Gross per 24 hour  Intake --  Output 1930 ml  Net -1930 ml   Filed Weights   07/10/20 2123  Weight: 71.7 kg    Examination: GENERAL: No apparent distress.  Nontoxic. HEENT: MMM.  Vision and hearing grossly intact.  NECK: Supple.  No apparent JVD.  RESP: 88% on 15 L by HFNC/NRB.  No IWOB.  Good air movement bilaterally. CVS:  RRR. Heart sounds normal.  ABD/GI/GU: BS+. Abd soft, NTND.  MSK/EXT:  Moves extremities. No apparent deformity. No edema.  SKIN: no apparent  skin lesion or wound NEURO: Awake, alert and oriented appropriately.  No apparent focal neuro deficit. PSYCH: Calm. Normal affect.  Procedures:  None  Microbiology summarized: COVID-19 PCR positive Blood cultures negative.  Assessment & Plan: Acute respiratory failure with hypoxia due to COVID-19 pneumonia: Tested positive on 07/01/2020.  Increased inflammatory markers.  CTA chest negative for PE but groundglass opacities.  Procalcitonin negative.Inflammatory markers elevated.  Oxygen requirement improved.  Now on 15 L by HFNC/NRB. Recent Labs    07/14/20 0643 07/15/20 0251 07/15/20 0509 07/16/20 0752  DDIMER 1.71* 1.91*  --  1.90*  FERRITIN 915*  --  1,200*  --   CRP 9.3* 11.65*  --   --   -Continue baricitinib 8/31>> -Increased Solu-Medrol to 80 mg twice daily on 9/1 -Continue remdesivir but doubt utility at this time. -Supplemental oxygen, inhalers, mucolytic's, antitussives, vit C, Zinc,  IS and proning while awake -Continue monitoring inflammatory markers -Subcu Lovenox for VTE prophylaxis.  Hypokalemia-resolved -Continue to monitor  Mild transaminitis likely related to COVID-19.  Resolved.  Mild hyponatremia: Resolved. -Continue monitoring.  GERD -Continue PPI  Dyslipidemia -Continue atorvastatin  Hypertension/sinus tachycardia: Normotensive.  Tachycardia resolved. -Continue metoprolol 25 mg twice daily  Body mass index is 23.33 kg/m.  DVT prophylaxis:  enoxaparin (LOVENOX) injection 40 mg Start: 07/11/20 2200  Code Status: Full code Family Communication: Updated wife over the phone.  Status is: Inpatient  Remains inpatient appropriate because:IV treatments appropriate due to intensity of illness or inability to take PO, Inpatient level of care appropriate due to severity of illness and Respiratory failure with significant oxygen requirement   Dispo: The patient is from: Home              Anticipated d/c is to: Home               Anticipated d/c date is: > 3 days              Patient currently is not medically stable to d/c.       Consultants:  PCCM   Sch Meds:  Scheduled Meds: . vitamin C  500 mg Oral Daily  . atorvastatin  20 mg Oral Daily  . baricitinib  4 mg Oral Daily  . chlorhexidine  15 mL Mouth Rinse BID  . Chlorhexidine Gluconate Cloth  6 each Topical Daily  . enoxaparin (LOVENOX) injection  40 mg Subcutaneous QHS  . fluticasone  1 spray Each Nare Daily  . loratadine  10 mg Oral Daily  . mouth rinse  15 mL Mouth Rinse q12n4p  . methylPREDNISolone (SOLU-MEDROL) injection  80 mg Intravenous BID  . metoprolol tartrate  25 mg Oral BID  . pantoprazole  40 mg Oral Daily  . zinc sulfate  220 mg Oral Daily   Continuous Infusions:  PRN Meds:.acetaminophen **OR** acetaminophen, albuterol, chlorpheniramine-HYDROcodone, cyclobenzaprine, guaiFENesin-dextromethorphan, LORazepam, Muscle Rub, ondansetron **OR** ondansetron (ZOFRAN) IV, oxyCODONE, sodium chloride  Antimicrobials: Anti-infectives (From admission, onward)   Start     Dose/Rate Route Frequency Ordered Stop   07/12/20 1000  remdesivir 100 mg in sodium chloride 0.9 % 100 mL IVPB       "Followed by" Linked Group Details   100 mg 200 mL/hr over 30 Minutes Intravenous Daily 07/11/20 0028 07/15/20 0922   07/11/20 1000  remdesivir 100 mg in sodium chloride 0.9 % 100 mL IVPB  Status:  Discontinued       "Followed by" Linked Group Details   100 mg 200 mL/hr over 30 Minutes Intravenous Daily 07/10/20 2338 07/11/20 0027   07/11/20 0030  remdesivir 100 mg in sodium chloride 0.9 % 100 mL IVPB       "Followed by" Linked Group Details   100 mg 200 mL/hr over 30 Minutes Intravenous Every 30 min 07/11/20 0028 07/11/20 0210   07/10/20 2345  remdesivir 200 mg in sodium chloride 0.9% 250 mL IVPB  Status:  Discontinued       "Followed by" Linked Group Details   200 mg 580 mL/hr over 30 Minutes Intravenous Once 07/10/20 2338 07/11/20 0027       I have  personally reviewed the following labs and images: CBC: Recent Labs  Lab 07/11/20 0845 07/12/20 0649 07/13/20 0641 07/14/20 0643 07/15/20 0251  WBC 4.1 12.5* 16.7* 15.6* 15.2*  NEUTROABS 3.3 10.1* 14.7* 13.7* 14.0*  HGB 13.5 13.7 14.3 14.6 15.0  HCT 39.0 40.4 43.1 43.0 43.1  MCV 85.5 87.1 87.8 85.8 84.2  PLT 343 492* 592* 642* 571*   BMP &GFR Recent Labs  Lab 07/11/20 0845 07/12/20 0649 07/13/20 0641 07/14/20 0643 07/15/20 0251  NA 138 137 137 134* 135  K 3.4* 3.3* 4.1 3.9 4.5  CL 104 102 104 99 100  CO2 23 25 23  21* 23  GLUCOSE 156* 149* 142* 141* 157*  BUN 8 14 19 16 19   CREATININE 0.64 0.71 0.75 0.67 0.80  CALCIUM 7.5* 8.3* 8.6* 8.8* 8.8*  MG 2.1 2.4 2.5* 2.3 2.4  PHOS 2.2* 3.3 4.2 4.1 3.6   Estimated Creatinine Clearance: 128.9 mL/min (by C-G formula based on SCr of 0.8 mg/dL). Liver & Pancreas: Recent Labs  Lab 07/11/20 0845 07/12/20 0649 07/13/20 0641 07/14/20 0643 07/15/20 0251  AST 88* 66* 43* 30 26  ALT 70* 72* 67* 51* 42  ALKPHOS 48 48 56 57 50  BILITOT 1.0 1.2 1.4* 1.7* 1.6*  PROT 6.3* 6.3* 6.8 7.5 7.3  ALBUMIN 3.1* 3.2* 3.3* 3.5 3.3*   No results for input(s): LIPASE, AMYLASE in the last 168 hours. No results for input(s): AMMONIA in the last 168 hours. Diabetic: No results for input(s): HGBA1C in the last 72 hours. No results for input(s): GLUCAP in the last 168 hours. Cardiac Enzymes: No results for input(s): CKTOTAL, CKMB, CKMBINDEX, TROPONINI in the last 168 hours. No results for input(s): PROBNP in the last 8760 hours. Coagulation Profile: No results for input(s): INR, PROTIME in the last 168 hours. Thyroid Function Tests: No results for input(s): TSH, T4TOTAL, FREET4, T3FREE, THYROIDAB in the last 72 hours. Lipid Profile: No results for input(s): CHOL, HDL, LDLCALC, TRIG, CHOLHDL, LDLDIRECT in the last 72 hours. Anemia Panel: Recent Labs    07/14/20 0643 07/15/20 0509  FERRITIN 915* 1,200*   Urine analysis: No results found  for: COLORURINE, APPEARANCEUR, LABSPEC, PHURINE, GLUCOSEU, HGBUR, BILIRUBINUR, KETONESUR, PROTEINUR, UROBILINOGEN, NITRITE, LEUKOCYTESUR Sepsis Labs: Invalid input(s): PROCALCITONIN, [place]  Microbiology: Recent Results (from the past 240 hour(s))  Blood Culture (routine x 2)     Status: None   Collection Time: 07/10/20 10:00 PM   Specimen: BLOOD  Result Value Ref Range Status   Specimen Description BLOOD LEFT ANTECUBITAL  Final   Special Requests   Final    BOTTLES DRAWN AEROBIC AND ANAEROBIC Blood Culture adequate volume   Culture   Final    NO GROWTH 5 DAYS Performed at [facility], [address], [place], [place] [address]    Report Status 07/15/2020 FINAL  Final  Blood Culture (routine x 2)     Status: None   Collection Time: 07/10/20 11:14 PM   Specimen: BLOOD  Result Value Ref Range Status   Specimen Description BLOOD RIGHT ANTECUBITAL  Final   Special Requests   Final    BOTTLES DRAWN AEROBIC AND ANAEROBIC Blood Culture adequate volume   Culture   Final    NO GROWTH 5 DAYS Performed at [facility], [address], [place], [place] [address]    Report Status 07/15/2020 FINAL  Final  SARS Coronavirus 2 by RT PCR (hospital order, performed in [facility] hospital lab) Nasopharyngeal Nasopharyngeal Swab     Status: Abnormal   Collection Time: 07/11/20  1:20 AM   Specimen: Nasopharyngeal Swab  Result Value Ref Range Status   SARS Coronavirus 2 POSITIVE (A) NEGATIVE Final    Comment: RESULT CALLED TO, READ BACK BY AND VERIFIED WITH: Andres Ege @[id number]  07/11/20 MKELLY (NOTE) SARS-CoV-2 target nucleic acids are DETECTED  SARS-CoV-2 RNA is generally detectable in upper respiratory specimens  during the acute phase of infection.  Positive results are indicative  of the presence of the identified virus, but do not rule out bacterial infection or co-infection with other pathogens not detected by the test.  Clinical correlation with patient history and  other diagnostic  information is  necessary to determine patient infection status.  The expected result is negative.  Fact Sheet for Patients:   StrictlyIdeas.no   Fact Sheet for Healthcare Providers:   BankingDealers.co.za    This test is not yet approved or cleared by the [place] FDA and  has been authorized for detection and/or diagnosis of SARS-CoV-2 by FDA under an Emergency Use Authorization (EUA).  This EUA will remain in effect (meaning this tes t can be used) for the duration of  the COVID-19 declaration under Section 564(b)(1) of the Act, 21 U.S.C. section 360-bbb-3(b)(1), unless the authorization is terminated or revoked sooner.  Performed at [facility], [address], [place], [place] [address]   Culture, blood (routine x 2)     Status: None (Preliminary result)   Collection Time: 07/13/20  7:21 PM   Specimen: Right Antecubital; Blood  Result Value Ref Range Status   Specimen Description RIGHT ANTECUBITAL  Final   Special Requests   Final    BOTTLES DRAWN AEROBIC AND ANAEROBIC Blood Culture adequate volume   Culture   Final    NO GROWTH 3 DAYS Performed at [facility], [address] [place], [place] [address]    Report Status PENDING  Incomplete  Culture, blood (routine x 2)     Status: None (Preliminary result)   Collection Time: 07/13/20  7:21 PM   Specimen: Right Antecubital; Blood  Result Value Ref Range Status   Specimen Description RIGHT ANTECUBITAL  Final   Special Requests   Final    BOTTLES DRAWN AEROBIC AND ANAEROBIC Blood Culture adequate volume   Culture   Final    NO GROWTH 3 DAYS Performed at [facility], [address], [place], [place] [address]    Report Status PENDING  Incomplete  MRSA PCR Screening     Status: None   Collection Time: 07/14/20 10:06 AM   Specimen: Nasal Mucosa; Nasopharyngeal  Result Value Ref Range Status   MRSA by PCR NEGATIVE NEGATIVE Final    Comment:        The GeneXpert MRSA  Assay (FDA approved for NASAL specimens only), is one component of a comprehensive MRSA colonization surveillance program. It is not intended to diagnose MRSA infection nor to guide or monitor treatment for MRSA infections. Performed at [facility], [place] [address] [place], [place] [address]     Radiology Studies: No results found.    Marry Kusch T[facility] [place]  If 7PM-7AM, please contact night-coverage [email or web address] 07/16/2020, 12:00 PM

## 2020-07-17 LAB — C-REACTIVE PROTEIN: CRP: 2.2 mg/dL — ABNORMAL HIGH (ref <1.0)

## 2020-07-17 LAB — COMPREHENSIVE METABOLIC PANEL
ALT: 33 U/L (ref 0–44)
AST: 19 U/L (ref 15–41)
Albumin: 3.5 g/dL (ref 3.5–5.0)
Alkaline Phosphatase: 58 U/L (ref 38–126)
Anion gap: 12 (ref 5–15)
BUN: 26 mg/dL — ABNORMAL HIGH (ref 6–20)
CO2: 27 mmol/L (ref 22–32)
Calcium: 9 mg/dL (ref 8.9–10.3)
Chloride: 98 mmol/L (ref 98–111)
Creatinine, Ser: 0.73 mg/dL (ref 0.61–1.24)
GFR calc Af Amer: >60 mL/min (ref >60)
GFR calc non Af Amer: >60 mL/min (ref >60)
Glucose, Bld: 153 mg/dL — ABNORMAL HIGH (ref 70–99)
Potassium: 4.6 mmol/L (ref 3.5–5.1)
Sodium: 137 mmol/L (ref 135–145)
Total Bilirubin: 1.5 mg/dL — ABNORMAL HIGH (ref 0.3–1.2)
Total Protein: 7.2 g/dL (ref 6.5–8.1)

## 2020-07-17 LAB — CBC WITH DIFFERENTIAL/PLATELET
Abs Immature Granulocytes: 0.16 10*3/uL — ABNORMAL HIGH (ref 0.00–0.07)
Basophils Absolute: 0 10*3/uL (ref 0.0–0.1)
Basophils Relative: 0 %
Eosinophils Absolute: 0 10*3/uL (ref 0.0–0.5)
Eosinophils Relative: 0 %
HCT: 44.8 % (ref 39.0–52.0)
Hemoglobin: 15.2 g/dL (ref 13.0–17.0)
Immature Granulocytes: 1 %
Lymphocytes Relative: 3 %
Lymphs Abs: 0.5 10*3/uL — ABNORMAL LOW (ref 0.7–4.0)
MCH: 29.5 pg (ref 26.0–34.0)
MCHC: 33.9 g/dL (ref 30.0–36.0)
MCV: 87 fL (ref 80.0–100.0)
Monocytes Absolute: 0.8 10*3/uL (ref 0.1–1.0)
Monocytes Relative: 4 %
Neutro Abs: 16.9 10*3/uL — ABNORMAL HIGH (ref 1.7–7.7)
Neutrophils Relative %: 92 %
Platelets: 665 10*3/uL — ABNORMAL HIGH (ref 150–400)
RBC: 5.15 MIL/uL (ref 4.22–5.81)
RDW: 12.1 % (ref 11.5–15.5)
WBC: 18.4 10*3/uL — ABNORMAL HIGH (ref 4.0–10.5)
nRBC: 0 % (ref 0.0–0.2)

## 2020-07-17 LAB — D-DIMER, QUANTITATIVE: D-Dimer, Quant: 1.65 ug/mL-FEU — ABNORMAL HIGH (ref 0.00–0.50)

## 2020-07-17 LAB — FERRITIN: Ferritin: 1047 ng/mL — ABNORMAL HIGH (ref 24–336)

## 2020-07-17 MED ORDER — METHYLPREDNISOLONE SODIUM SUCC 125 MG IJ SOLR
60.0000 mg | Freq: Two times a day (BID) | INTRAMUSCULAR | Status: DC
  Administered 2020-07-17 (×2): 60 mg via INTRAVENOUS
  Filled 2020-07-17: qty 2

## 2020-07-17 NOTE — Plan of Care (Signed)
  Problem: Education: Goal: Knowledge of risk factors and measures for prevention of condition will improve Outcome: Progressing   Problem: Coping: Goal: Psychosocial and spiritual needs will be supported Outcome: Progressing   Problem: Respiratory: Goal: Will maintain a patent airway Outcome: Progressing Goal: Complications related to the disease process, condition or treatment will be avoided or minimized Outcome: Progressing   Problem: Education: Goal: Knowledge of risk factors and measures for prevention of condition will improve Outcome: Progressing   Problem: Coping: Goal: Psychosocial and spiritual needs will be supported Outcome: Progressing   Problem: Respiratory: Goal: Will maintain a patent airway Outcome: Progressing Goal: Complications related to the disease process, condition or treatment will be avoided or minimized Outcome: Progressing   Problem: Education: Goal: Knowledge of General Education information will improve Description: Including pain rating scale, medication(s)/side effects and non-pharmacologic comfort measures Outcome: Progressing   Problem: Health Behavior/Discharge Planning: Goal: Ability to manage health-related needs will improve Outcome: Progressing   Problem: Clinical Measurements: Goal: Ability to maintain clinical measurements within normal limits will improve Outcome: Progressing Goal: Will remain free from infection Outcome: Progressing Goal: Diagnostic test results will improve Outcome: Progressing Goal: Respiratory complications will improve Outcome: Progressing Goal: Cardiovascular complication will be avoided Outcome: Progressing   Problem: Activity: Goal: Risk for activity intolerance will decrease Outcome: Progressing   Problem: Nutrition: Goal: Adequate nutrition will be maintained Outcome: Progressing   Problem: Coping: Goal: Level of anxiety will decrease Outcome: Progressing   Problem: Elimination: Goal:  Will not experience complications related to bowel motility Outcome: Progressing Goal: Will not experience complications related to urinary retention Outcome: Progressing   Problem: Pain Managment: Goal: General experience of comfort will improve Outcome: Progressing   Problem: Safety: Goal: Ability to remain free from injury will improve Outcome: Progressing   Problem: Skin Integrity: Goal: Risk for impaired skin integrity will decrease Outcome: Progressing   

## 2020-07-17 NOTE — Progress Notes (Signed)
PROGRESS NOTE  Kent Hernandez WRU:[id number] DOB: 1986/08/21   PCP: Susy Frizzle, MD  Patient is from: Home  DOA: 07/10/2020 LOS: 7  Brief Narrative / Interim history: 34 year old male with history of cervical DDD, HTN, GERD, H. pylori infection and allergic rhinitis presenting with COVID-19 symptoms.  He tested positive on 07/01/2020 at [facility].  He was exposed to his wife who had URI symptoms before him.  In ED, he was hypoxic requiring 4 L.  Inflammatory markers elevated.  CTA chest negative for PE but bilateral groundglass opacities.  Patient was started on steroid, remdesivir and admitted.   Patient had increased oxygen requirement.  Baricitinib added on 07/13/2020.  He was transferred to [facility] as there was not SDU or ICU bed at [facility].  On arrival here, sitting in upper 80s to lower 90s 35 L / 100% HFNC and NRB.  Solu-Medrol dose increased.  PCCM consulted.  Patient is now on 11 L HFNC/NRB, and slowly improving.   Subjective: Seen and examined earlier this morning.  Desaturated to 50% when he stood up last night.  No complaints this morning.  Breathing improved.  Still with some cough.  Denies chest pain, GI or UTI symptoms.  Saturating in the upper 80s to lower 90s on 11 L by HFNC/NRB.  Easily desaturates with minimal exertion.  Objective: Vitals:   07/17/20 0400 07/17/20 0500 07/17/20 0600 07/17/20 0825  BP: 129/80   129/78  Pulse: 79 69 68 92  Resp: 18 18 18  (!) 25  Temp: 98.9 F (37.2 C)   98.9 F (37.2 C)  TempSrc: Axillary   Axillary  SpO2: 93% 94% 94% 92%  Weight:      Height:        Intake/Output Summary (Last 24 hours) at 07/17/2020 0956 Last data filed at 07/16/2020 2000 Gross per 24 hour  Intake --  Output 825 ml  Net -825 ml   Filed Weights   07/10/20 2123  Weight: 71.7 kg    Examination: GENERAL: No apparent distress.  Nontoxic. HEENT: MMM.  Vision and hearing grossly intact.  NECK: Supple.  No apparent JVD.  RESP: 85 to  90% on 11 L HFNC/NRB.  No IWOB.  Good air movement bilaterally. CVS:  RRR. Heart sounds normal.  ABD/GI/GU: BS+. Abd soft, NTND.  MSK/EXT:  Moves extremities. No apparent deformity. No edema.  SKIN: no apparent skin lesion or wound NEURO: Awake, alert and oriented appropriately.  No apparent focal neuro deficit. PSYCH: Calm. Normal affect.  Procedures:  None  Microbiology summarized: COVID-19 PCR positive Blood cultures negative.  Assessment & Plan: Acute respiratory failure with hypoxia due to COVID-19 pneumonia: Tested positive on 07/01/2020.  Increased inflammatory markers.  CTA chest negative for PE but groundglass opacities.  Procalcitonin negative.Inflammatory markers downtrending.  Oxygen requirement improved.  Now on 11 L by HFNC/NRB Recent Labs    07/15/20 0251 07/15/20 0509 07/16/20 0752 07/17/20 0242  DDIMER 1.91*  --  1.90* 1.65*  FERRITIN  --  1,200*  --  1,047*  CRP 11.65*  --   --  2.2*  -Remdesivir 8/29-9/2 -Continue baricitinib 8/31>>  -Decrease Solu-Medrol to 60 mg twice daily.  Started on 8/29. -Supplemental oxygen, inhalers, mucolytic's, antitussives, vit C, Zinc,  IS and proning while awake -Continue monitoring inflammatory markers -Subcu Lovenox for VTE prophylaxis.  Hypokalemia-resolved -Continue to monitor  Mild transaminitis likely related to COVID-19.  Resolved.  Mild hyponatremia: Resolved. -Continue monitoring.  GERD -Continue PPI  Dyslipidemia -  Continue atorvastatin  Hypertension/sinus tachycardia: Normotensive.  Tachycardia resolved. -Continue metoprolol 25 mg twice daily  Body mass index is 23.33 kg/m.         DVT prophylaxis:  enoxaparin (LOVENOX) injection 40 mg Start: 07/11/20 2200  Code Status: Full code Family Communication: Updated wife over the phone on 9/3.  Status is: Inpatient  Remains inpatient appropriate because:IV treatments appropriate due to intensity of illness or inability to take PO, Inpatient level of  care appropriate due to severity of illness and Respiratory failure with significant oxygen requirement   Dispo: The patient is from: Home              Anticipated d/c is to: Home              Anticipated d/c date is: > 3 days              Patient currently is not medically stable to d/c.       Consultants:  PCCM   Sch Meds:  Scheduled Meds: . vitamin C  500 mg Oral Daily  . atorvastatin  20 mg Oral Daily  . baricitinib  4 mg Oral Daily  . chlorhexidine  15 mL Mouth Rinse BID  . Chlorhexidine Gluconate Cloth  6 each Topical Daily  . enoxaparin (LOVENOX) injection  40 mg Subcutaneous QHS  . fluticasone  1 spray Each Nare Daily  . loratadine  10 mg Oral Daily  . mouth rinse  15 mL Mouth Rinse q12n4p  . methylPREDNISolone (SOLU-MEDROL) injection  60 mg Intravenous BID  . metoprolol tartrate  25 mg Oral BID  . pantoprazole  40 mg Oral Daily  . zinc sulfate  220 mg Oral Daily   Continuous Infusions:  PRN Meds:.acetaminophen **OR** acetaminophen, albuterol, chlorpheniramine-HYDROcodone, cyclobenzaprine, guaiFENesin-dextromethorphan, LORazepam, Muscle Rub, ondansetron **OR** ondansetron (ZOFRAN) IV, oxyCODONE, sodium chloride  Antimicrobials: Anti-infectives (From admission, onward)   Start     Dose/Rate Route Frequency Ordered Stop   07/12/20 1000  remdesivir 100 mg in sodium chloride 0.9 % 100 mL IVPB       "Followed by" Linked Group Details   100 mg 200 mL/hr over 30 Minutes Intravenous Daily 07/11/20 0028 07/15/20 0922   07/11/20 1000  remdesivir 100 mg in sodium chloride 0.9 % 100 mL IVPB  Status:  Discontinued       "Followed by" Linked Group Details   100 mg 200 mL/hr over 30 Minutes Intravenous Daily 07/10/20 2338 07/11/20 0027   07/11/20 0030  remdesivir 100 mg in sodium chloride 0.9 % 100 mL IVPB       "Followed by" Linked Group Details   100 mg 200 mL/hr over 30 Minutes Intravenous Every 30 min 07/11/20 0028 07/11/20 0210   07/10/20 2345  remdesivir 200 mg in  sodium chloride 0.9% 250 mL IVPB  Status:  Discontinued       "Followed by" Linked Group Details   200 mg 580 mL/hr over 30 Minutes Intravenous Once 07/10/20 2338 07/11/20 0027       I have personally reviewed the following labs and images: CBC: Recent Labs  Lab 07/12/20 0649 07/13/20 0641 07/14/20 0643 07/15/20 0251 07/17/20 0242  WBC 12.5* 16.7* 15.6* 15.2* 18.4*  NEUTROABS 10.1* 14.7* 13.7* 14.0* 16.9*  HGB 13.7 14.3 14.6 15.0 15.2  HCT 40.4 43.1 43.0 43.1 44.8  MCV 87.1 87.8 85.8 84.2 87.0  PLT 492* 592* 642* 571* 665*   BMP &GFR Recent Labs  Lab 07/11/20 0845 07/11/20 0845 07/12/20 0649 07/13/20  2778 07/14/20 0643 07/15/20 0251 07/17/20 0242  NA 138   < > 137 137 134* 135 137  K 3.4*   < > 3.3* 4.1 3.9 4.5 4.6  CL 104   < > 102 104 99 100 98  CO2 23   < > 25 23 21* 23 27  GLUCOSE 156*   < > 149* 142* 141* 157* 153*  BUN 8   < > 14 19 16 19  26*  CREATININE 0.64   < > 0.71 0.75 0.67 0.80 0.73  CALCIUM 7.5*   < > 8.3* 8.6* 8.8* 8.8* 9.0  MG 2.1  --  2.4 2.5* 2.3 2.4  --   PHOS 2.2*  --  3.3 4.2 4.1 3.6  --    < > = values in this interval not displayed.   Estimated Creatinine Clearance: 128.9 mL/min (by C-G formula based on SCr of 0.73 mg/dL). Liver & Pancreas: Recent Labs  Lab 07/12/20 0649 07/13/20 0641 07/14/20 0643 07/15/20 0251 07/17/20 0242  AST 66* 43* 30 26 19   ALT 72* 67* 51* 42 33  ALKPHOS 48 56 57 50 58  BILITOT 1.2 1.4* 1.7* 1.6* 1.5*  PROT 6.3* 6.8 7.5 7.3 7.2  ALBUMIN 3.2* 3.3* 3.5 3.3* 3.5   No results for input(s): LIPASE, AMYLASE in the last 168 hours. No results for input(s): AMMONIA in the last 168 hours. Diabetic: No results for input(s): HGBA1C in the last 72 hours. No results for input(s): GLUCAP in the last 168 hours. Cardiac Enzymes: No results for input(s): CKTOTAL, CKMB, CKMBINDEX, TROPONINI in the last 168 hours. No results for input(s): PROBNP in the last 8760 hours. Coagulation Profile: No results for input(s): INR,  PROTIME in the last 168 hours. Thyroid Function Tests: No results for input(s): TSH, T4TOTAL, FREET4, T3FREE, THYROIDAB in the last 72 hours. Lipid Profile: No results for input(s): CHOL, HDL, LDLCALC, TRIG, CHOLHDL, LDLDIRECT in the last 72 hours. Anemia Panel: Recent Labs    07/15/20 0509 07/17/20 0242  FERRITIN 1,200* 1,047*   Urine analysis: No results found for: COLORURINE, APPEARANCEUR, LABSPEC, PHURINE, GLUCOSEU, HGBUR, BILIRUBINUR, KETONESUR, PROTEINUR, UROBILINOGEN, NITRITE, LEUKOCYTESUR Sepsis Labs: Invalid input(s): PROCALCITONIN, [place]  Microbiology: Recent Results (from the past 240 hour(s))  Blood Culture (routine x 2)     Status: None   Collection Time: 07/10/20 10:00 PM   Specimen: BLOOD  Result Value Ref Range Status   Specimen Description BLOOD LEFT ANTECUBITAL  Final   Special Requests   Final    BOTTLES DRAWN AEROBIC AND ANAEROBIC Blood Culture adequate volume   Culture   Final    NO GROWTH 5 DAYS Performed at [facility], [address] [place], [place] [address]    Report Status 07/15/2020 FINAL  Final  Blood Culture (routine x 2)     Status: None   Collection Time: 07/10/20 11:14 PM   Specimen: BLOOD  Result Value Ref Range Status   Specimen Description BLOOD RIGHT ANTECUBITAL  Final   Special Requests   Final    BOTTLES DRAWN AEROBIC AND ANAEROBIC Blood Culture adequate volume   Culture   Final    NO GROWTH 5 DAYS Performed at [facility], [address], [place], [place] [address]    Report Status 07/15/2020 FINAL  Final  SARS Coronavirus 2 by RT PCR (hospital order, performed in [facility] hospital lab) Nasopharyngeal Nasopharyngeal Swab     Status: Abnormal   Collection Time: 07/11/20  1:20 AM   Specimen: Nasopharyngeal Swab  Result Value Ref Range Status   SARS Coronavirus 2 POSITIVE (A) NEGATIVE Final    Comment: RESULT CALLED TO, READ BACK BY AND VERIFIED WITH: Andres Ege @[id number]  07/11/20 MKELLY (NOTE) SARS-CoV-2 target nucleic  acids are DETECTED  SARS-CoV-2 RNA is generally detectable in upper respiratory specimens  during the acute phase of infection.  Positive results are indicative  of the presence of the identified virus, but do not rule out bacterial infection or co-infection with other pathogens not detected by the test.  Clinical correlation with patient history and  other diagnostic information is necessary to determine patient infection status.  The expected result is negative.  Fact Sheet for Patients:   StrictlyIdeas.no   Fact Sheet for Healthcare Providers:   BankingDealers.co.za    This test is not yet approved or cleared by the [place] FDA and  has been authorized for detection and/or diagnosis of SARS-CoV-2 by FDA under an Emergency Use Authorization (EUA).  This EUA will remain in effect (meaning this tes t can be used) for the duration of  the COVID-19 declaration under Section 564(b)(1) of the Act, 21 U.S.C. section 360-bbb-3(b)(1), unless the authorization is terminated or revoked sooner.  Performed at [facility], [address], [place], [place] [address]   Culture, blood (routine x 2)     Status: None (Preliminary result)   Collection Time: 07/13/20  7:21 PM   Specimen: Right Antecubital; Blood  Result Value Ref Range Status   Specimen Description RIGHT ANTECUBITAL  Final   Special Requests   Final    BOTTLES DRAWN AEROBIC AND ANAEROBIC Blood Culture adequate volume   Culture   Final    NO GROWTH 4 DAYS Performed at [facility], [address], [place], [place] [address]    Report Status PENDING  Incomplete  Culture, blood (routine x 2)     Status: None (Preliminary result)   Collection Time: 07/13/20  7:21 PM   Specimen: Right Antecubital; Blood  Result Value Ref Range Status   Specimen Description RIGHT ANTECUBITAL  Final   Special Requests   Final    BOTTLES DRAWN AEROBIC AND ANAEROBIC Blood Culture adequate volume    Culture   Final    NO GROWTH 4 DAYS Performed at [facility], [address], [place], [place] [address]    Report Status PENDING  Incomplete  MRSA PCR Screening     Status: None   Collection Time: 07/14/20 10:06 AM   Specimen: Nasal Mucosa; Nasopharyngeal  Result Value Ref Range Status   MRSA by PCR NEGATIVE NEGATIVE Final    Comment:        The GeneXpert MRSA Assay (FDA approved for NASAL specimens only), is one component of a comprehensive MRSA colonization surveillance program. It is not intended to diagnose MRSA infection nor to guide or monitor treatment for MRSA infections. Performed at [facility], [place] [address], [place], [place] [address]     Radiology Studies: No results found.    Aliece Honold T. [place]  If 7PM-7AM, please contact night-coverage [email or web address] 07/17/2020, 9:56 AM

## 2020-07-18 LAB — FERRITIN: Ferritin: 991 ng/mL — ABNORMAL HIGH (ref 24–336)

## 2020-07-18 LAB — D-DIMER, QUANTITATIVE: D-Dimer, Quant: 1.75 ug/mL-FEU — ABNORMAL HIGH (ref 0.00–0.50)

## 2020-07-18 LAB — CULTURE, BLOOD (ROUTINE X 2)
Culture: NO GROWTH
Culture: NO GROWTH
Special Requests: ADEQUATE
Special Requests: ADEQUATE

## 2020-07-18 LAB — C-REACTIVE PROTEIN: CRP: 1.3 mg/dL — ABNORMAL HIGH (ref <1.0)

## 2020-07-18 MED ORDER — METHYLPREDNISOLONE SODIUM SUCC 40 MG IJ SOLR
40.0000 mg | Freq: Two times a day (BID) | INTRAMUSCULAR | Status: AC
  Administered 2020-07-18 – 2020-07-20 (×6): 40 mg via INTRAVENOUS
  Filled 2020-07-18 (×6): qty 1

## 2020-07-18 MED ORDER — FAMOTIDINE 20 MG PO TABS
20.0000 mg | ORAL_TABLET | Freq: Once | ORAL | Status: AC
  Administered 2020-07-18: 20 mg via ORAL
  Filled 2020-07-18: qty 1

## 2020-07-18 NOTE — Progress Notes (Signed)
PROGRESS NOTE  Kent Hernandez:[id number] DOB: [birth year]   PCP: Susy Frizzle, MD  Patient is from: Home  DOA: 07/10/2020 LOS: 8  Brief Narrative / Interim history: 34 year old male with history of cervical DDD, HTN, GERD, H. pylori infection and allergic rhinitis presenting with COVID-19 symptoms.  He tested positive on 07/01/2020 at [facility].  He was exposed to his wife who had URI symptoms before him.  In ED, he was hypoxic requiring 4 L.  Inflammatory markers elevated.  CTA chest negative for PE but bilateral groundglass opacities.  Patient was started on steroid, remdesivir and admitted.   Patient had increased oxygen requirement.  Baricitinib added on 07/13/2020.  He was transferred to [facility] as there was not SDU or ICU bed at [facility].  On arrival here, sitting in upper 80s to lower 90s 35 L / 100% HFNC and NRB.  Solu-Medrol dose increased.  PCCM consulted.  Patient is now down to 10 L by HFNC/NRB.  Subjective: Seen and examined earlier this morning.  No major events overnight of this morning.  No complaints.  Reports improvement in his breathing and cough.  Denies GI or UTI symptoms.  Currently saturating 92 to 98% on 3 L by HFNC/NRB.  Objective: Vitals:   07/18/20 0400 07/18/20 0500 07/18/20 0600 07/18/20 0800  BP: 121/83 113/78 108/86   Pulse: 67 72 65   Resp: 17 18 15    Temp: 99 F (37.2 C)   98.1 F (36.7 C)  TempSrc:    Oral  SpO2: 95% 96% 93%   Weight:      Height:        Intake/Output Summary (Last 24 hours) at 07/18/2020 1101 Last data filed at 07/18/2020 0300 Gross per 24 hour  Intake --  Output 1100 ml  Net -1100 ml   Filed Weights   07/10/20 2123  Weight: 71.7 kg    Examination: GENERAL: No apparent distress.  Nontoxic. HEENT: MMM.  Vision and hearing grossly intact.  NECK: Supple.  No apparent JVD.  RESP: 92 to 98% on 3 L by HFNC/NRB.  No IWOB.  Good air movement bilaterally. CVS:  RRR. Heart sounds normal.  ABD/GI/GU:  BS+. Abd soft, NTND.  MSK/EXT:  Moves extremities. No apparent deformity. No edema.  SKIN: no apparent skin lesion or wound NEURO: Awake, alert and oriented appropriately.  No apparent focal neuro deficit. PSYCH: Calm. Normal affect.  Procedures:  None  Microbiology summarized: COVID-19 PCR positive Blood cultures negative.  Assessment & Plan: Acute respiratory failure with hypoxia due to COVID-19 pneumonia: Tested positive on 07/01/2020.  Increased inflammatory markers.  CTA chest negative for PE but groundglass opacities.  Procalcitonin negative. Inflammatory markers downtrending.  Oxygen requirement improved.  Now on 10 L by HFNC/NRB Recent Labs    07/16/20 0752 07/17/20 0242 07/18/20 0305  DDIMER 1.90* 1.65* 1.75*  FERRITIN  --  1,047* 991*  CRP  --  2.2* 1.3*  -Remdesivir 8/29-9/2 -Continue baricitinib 8/31>>  -Decrease Solu-Medrol to 40 mg twice daily.  Started on 8/29. -Supplemental oxygen, inhalers, mucolytic's, antitussives, vit C, Zinc,  IS and proning while awake -Continue monitoring inflammatory markers -Subcu Lovenox for VTE prophylaxis.  Hypokalemia-resolved -Continue to monitor  Mild transaminitis likely related to COVID-19.  Resolved.  Mild hyponatremia: Resolved. -Continue monitoring.  GERD -Continue PPI  Dyslipidemia -Continue atorvastatin  Hypertension/sinus tachycardia: Normotensive.  Tachycardia resolved. -Continue metoprolol 25 mg twice daily  Body mass index is 23.33 kg/m.  DVT prophylaxis:  enoxaparin (LOVENOX) injection 40 mg Start: 07/11/20 2200  Code Status: Full code Family Communication: Updated wife over the phone. Status is: Inpatient  Remains inpatient appropriate because:IV treatments appropriate due to intensity of illness or inability to take PO, Inpatient level of care appropriate due to severity of illness and Respiratory failure with significant oxygen requirement   Dispo: The patient is from: Home               Anticipated d/c is to: Home              Anticipated d/c date is: > 3 days              Patient currently is not medically stable to d/c.       Consultants:  PCCM   Sch Meds:  Scheduled Meds: . vitamin C  500 mg Oral Daily  . atorvastatin  20 mg Oral Daily  . baricitinib  4 mg Oral Daily  . chlorhexidine  15 mL Mouth Rinse BID  . Chlorhexidine Gluconate Cloth  6 each Topical Daily  . enoxaparin (LOVENOX) injection  40 mg Subcutaneous QHS  . fluticasone  1 spray Each Nare Daily  . loratadine  10 mg Oral Daily  . mouth rinse  15 mL Mouth Rinse q12n4p  . methylPREDNISolone (SOLU-MEDROL) injection  40 mg Intravenous BID  . metoprolol tartrate  25 mg Oral BID  . pantoprazole  40 mg Oral Daily  . zinc sulfate  220 mg Oral Daily   Continuous Infusions:  PRN Meds:.acetaminophen **OR** acetaminophen, albuterol, chlorpheniramine-HYDROcodone, cyclobenzaprine, guaiFENesin-dextromethorphan, LORazepam, Muscle Rub, ondansetron **OR** ondansetron (ZOFRAN) IV, oxyCODONE, sodium chloride  Antimicrobials: Anti-infectives (From admission, onward)   Start     Dose/Rate Route Frequency Ordered Stop   07/12/20 1000  remdesivir 100 mg in sodium chloride 0.9 % 100 mL IVPB       "Followed by" Linked Group Details   100 mg 200 mL/hr over 30 Minutes Intravenous Daily 07/11/20 0028 07/15/20 0922   07/11/20 1000  remdesivir 100 mg in sodium chloride 0.9 % 100 mL IVPB  Status:  Discontinued       "Followed by" Linked Group Details   100 mg 200 mL/hr over 30 Minutes Intravenous Daily 07/10/20 2338 07/11/20 0027   07/11/20 0030  remdesivir 100 mg in sodium chloride 0.9 % 100 mL IVPB       "Followed by" Linked Group Details   100 mg 200 mL/hr over 30 Minutes Intravenous Every 30 min 07/11/20 0028 07/11/20 0210   07/10/20 2345  remdesivir 200 mg in sodium chloride 0.9% 250 mL IVPB  Status:  Discontinued       "Followed by" Linked Group Details   200 mg 580 mL/hr over 30 Minutes Intravenous Once  07/10/20 2338 07/11/20 0027       I have personally reviewed the following labs and images: CBC: Recent Labs  Lab 07/12/20 0649 07/13/20 0641 07/14/20 0643 07/15/20 0251 07/17/20 0242  WBC 12.5* 16.7* 15.6* 15.2* 18.4*  NEUTROABS 10.1* 14.7* 13.7* 14.0* 16.9*  HGB 13.7 14.3 14.6 15.0 15.2  HCT 40.4 43.1 43.0 43.1 44.8  MCV 87.1 87.8 85.8 84.2 87.0  PLT 492* 592* 642* 571* 665*   BMP &GFR Recent Labs  Lab 07/12/20 0649 07/13/20 0641 07/14/20 0643 07/15/20 0251 07/17/20 0242  NA 137 137 134* 135 137  K 3.3* 4.1 3.9 4.5 4.6  CL 102 104 99 100 98  CO2 25 23 21* 23 27  GLUCOSE  149* 142* 141* 157* 153*  BUN 14 19 16 19  26*  CREATININE 0.71 0.75 0.67 0.80 0.73  CALCIUM 8.3* 8.6* 8.8* 8.8* 9.0  MG 2.4 2.5* 2.3 2.4  --   PHOS 3.3 4.2 4.1 3.6  --    Estimated Creatinine Clearance: 128.9 mL/min (by C-G formula based on SCr of 0.73 mg/dL). Liver & Pancreas: Recent Labs  Lab 07/12/20 0649 07/13/20 0641 07/14/20 0643 07/15/20 0251 07/17/20 0242  AST 66* 43* 30 26 19   ALT 72* 67* 51* 42 33  ALKPHOS 48 56 57 50 58  BILITOT 1.2 1.4* 1.7* 1.6* 1.5*  PROT 6.3* 6.8 7.5 7.3 7.2  ALBUMIN 3.2* 3.3* 3.5 3.3* 3.5   No results for input(s): LIPASE, AMYLASE in the last 168 hours. No results for input(s): AMMONIA in the last 168 hours. Diabetic: No results for input(s): HGBA1C in the last 72 hours. No results for input(s): GLUCAP in the last 168 hours. Cardiac Enzymes: No results for input(s): CKTOTAL, CKMB, CKMBINDEX, TROPONINI in the last 168 hours. No results for input(s): PROBNP in the last 8760 hours. Coagulation Profile: No results for input(s): INR, PROTIME in the last 168 hours. Thyroid Function Tests: No results for input(s): TSH, T4TOTAL, FREET4, T3FREE, THYROIDAB in the last 72 hours. Lipid Profile: No results for input(s): CHOL, HDL, LDLCALC, TRIG, CHOLHDL, LDLDIRECT in the last 72 hours. Anemia Panel: Recent Labs    07/17/20 0242 07/18/20 0305  FERRITIN  1,047* 991*   Urine analysis: No results found for: COLORURINE, APPEARANCEUR, LABSPEC, PHURINE, GLUCOSEU, HGBUR, BILIRUBINUR, KETONESUR, PROTEINUR, UROBILINOGEN, NITRITE, LEUKOCYTESUR Sepsis Labs: Invalid input(s): PROCALCITONIN, [place]  Microbiology: Recent Results (from the past 240 hour(s))  Blood Culture (routine x 2)     Status: None   Collection Time: 07/10/20 10:00 PM   Specimen: BLOOD  Result Value Ref Range Status   Specimen Description BLOOD LEFT ANTECUBITAL  Final   Special Requests   Final    BOTTLES DRAWN AEROBIC AND ANAEROBIC Blood Culture adequate volume   Culture   Final    NO GROWTH 5 DAYS Performed at [facility], [address] [place], [place] [address]    Report Status 07/15/2020 FINAL  Final  Blood Culture (routine x 2)     Status: None   Collection Time: 07/10/20 11:14 PM   Specimen: BLOOD  Result Value Ref Range Status   Specimen Description BLOOD RIGHT ANTECUBITAL  Final   Special Requests   Final    BOTTLES DRAWN AEROBIC AND ANAEROBIC Blood Culture adequate volume   Culture   Final    NO GROWTH 5 DAYS Performed at [facility], [address], [place], [place] [address]    Report Status 07/15/2020 FINAL  Final  SARS Coronavirus 2 by RT PCR (hospital order, performed in [facility] hospital lab) Nasopharyngeal Nasopharyngeal Swab     Status: Abnormal   Collection Time: 07/11/20  1:20 AM   Specimen: Nasopharyngeal Swab  Result Value Ref Range Status   SARS Coronavirus 2 POSITIVE (A) NEGATIVE Final    Comment: RESULT CALLED TO, READ BACK BY AND VERIFIED WITH: Andres Ege @[id number]  07/11/20 MKELLY (NOTE) SARS-CoV-2 target nucleic acids are DETECTED  SARS-CoV-2 RNA is generally detectable in upper respiratory specimens  during the acute phase of infection.  Positive results are indicative  of the presence of the identified virus, but do not rule out bacterial infection or co-infection with other pathogens not detected by the test.  Clinical  correlation with patient history and  other  diagnostic information is necessary to determine patient infection status.  The expected result is negative.  Fact Sheet for Patients:   StrictlyIdeas.no   Fact Sheet for Healthcare Providers:   BankingDealers.co.za    This test is not yet approved or cleared by the [place] FDA and  has been authorized for detection and/or diagnosis of SARS-CoV-2 by FDA under an Emergency Use Authorization (EUA).  This EUA will remain in effect (meaning this tes t can be used) for the duration of  the COVID-19 declaration under Section 564(b)(1) of the Act, 21 U.S.C. section 360-bbb-3(b)(1), unless the authorization is terminated or revoked sooner.  Performed at [facility], [address], [place], [place] [address]   Culture, blood (routine x 2)     Status: None (Preliminary result)   Collection Time: 07/13/20  7:21 PM   Specimen: Right Antecubital; Blood  Result Value Ref Range Status   Specimen Description RIGHT ANTECUBITAL  Final   Special Requests   Final    BOTTLES DRAWN AEROBIC AND ANAEROBIC Blood Culture adequate volume   Culture   Final    NO GROWTH 4 DAYS Performed at [facility], [address] [place], [place] [address]    Report Status PENDING  Incomplete  Culture, blood (routine x 2)     Status: None (Preliminary result)   Collection Time: 07/13/20  7:21 PM   Specimen: Right Antecubital; Blood  Result Value Ref Range Status   Specimen Description RIGHT ANTECUBITAL  Final   Special Requests   Final    BOTTLES DRAWN AEROBIC AND ANAEROBIC Blood Culture adequate volume   Culture   Final    NO GROWTH 4 DAYS Performed at [facility], [address], [place], [place] [address]    Report Status PENDING  Incomplete  MRSA PCR Screening     Status: None   Collection Time: 07/14/20 10:06 AM   Specimen: Nasal Mucosa; Nasopharyngeal  Result Value Ref Range Status   MRSA by PCR NEGATIVE  NEGATIVE Final    Comment:        The GeneXpert MRSA Assay (FDA approved for NASAL specimens only), is one component of a comprehensive MRSA colonization surveillance program. It is not intended to diagnose MRSA infection nor to guide or monitor treatment for MRSA infections. Performed at [facility], [place] [address] [place],  [address]     Radiology Studies: No results found.  Alyza Artiaga T. [place]  If 7PM-7AM, please contact night-coverage [email or web address] 07/18/2020, 11:01 AM

## 2020-07-19 LAB — D-DIMER, QUANTITATIVE: D-Dimer, Quant: 1.69 ug/mL-FEU — ABNORMAL HIGH (ref 0.00–0.50)

## 2020-07-19 LAB — C-REACTIVE PROTEIN: CRP: 0.6 mg/dL (ref <1.0)

## 2020-07-19 LAB — FERRITIN: Ferritin: 882 ng/mL — ABNORMAL HIGH (ref 24–336)

## 2020-07-19 MED ORDER — ALUM & MAG HYDROXIDE-SIMETH 200-200-20 MG/5ML PO SUSP
30.0000 mL | Freq: Four times a day (QID) | ORAL | Status: DC | PRN
  Administered 2020-07-19: 30 mL via ORAL
  Filled 2020-07-19: qty 30

## 2020-07-19 NOTE — TOC Progression Note (Signed)
Transition of Care [facility]) - Progression Note    Patient Details  Name: STEFON RAMTHUN MRN: [id number] Date of Birth: [birth year]  Transition of Care [facility]) CM/SW Contact  Leeroy Cha, RN Phone Number: 07/19/2020, 8:05 AM  Clinical Narrative:    hfnc at 11l/min, iv solu medrol, crp-wnl at 0.6, d.dimer elevated at 1.69, ferritin elevated at 882. Following for progression of care and toc needs Plan is to return to home.   Expected Discharge Plan: Home/Self Care Barriers to Discharge: Continued Medical Work up  Expected Discharge Plan and Services Expected Discharge Plan: Home/Self Care   Discharge Planning Services: CM Consult   Living arrangements for the past 2 months: Single Family Home                                       Social Determinants of Health (SDOH) Interventions    Readmission Risk Interventions No flowsheet data found.

## 2020-07-19 NOTE — Progress Notes (Signed)
PROGRESS NOTE  Kent Hernandez TFT:[id number] DOB: [birth year]   PCP: Susy Frizzle, MD  Patient is from: Home  DOA: 07/10/2020 LOS: 9  Brief Narrative / Interim history: 34 year old male with history of cervical DDD, HTN, GERD, H. pylori infection and allergic rhinitis presenting with COVID-19 symptoms.  He tested positive on 07/01/2020 at [facility].  He was exposed to his wife who had URI symptoms before him.  In ED, he was hypoxic requiring 4 L.  Inflammatory markers elevated.  CTA chest negative for PE but bilateral groundglass opacities.  Patient was started on steroid, remdesivir and admitted.   Patient had increased oxygen requirement.  Baricitinib added on 07/13/2020.  He was transferred to [facility] as there was not SDU or ICU bed at [facility].  On arrival here, sitting in upper 80s to lower 90s 35 L / 100% HFNC and NRB.  Solu-Medrol dose increased.  PCCM consulted.  Patient is now down to 10 L by HFNC.   Subjective: Seen and examined earlier this morning.  No major events overnight of this morning.  He is now off nonrebreather saturating in the upper 80s to lower 90s on 3 L by HFNC.  No complaints.  Breathing and cough improved.  Denies GI or UTI symptoms.  Inflammatory markers normalizing.  Objective: Vitals:   07/19/20 0353 07/19/20 0400 07/19/20 0747 07/19/20 0936  BP:  125/73  129/83  Pulse: (!) 57 (!) 56  86  Resp: 18 16    Temp: 97.8 F (36.6 C)  98 F (36.7 C)   TempSrc: Oral  Oral   SpO2: 94% 95%    Weight:      Height:        Intake/Output Summary (Last 24 hours) at 07/19/2020 1121 Last data filed at 07/19/2020 0353 Gross per 24 hour  Intake 1600 ml  Output 1175 ml  Net 425 ml   Filed Weights   07/10/20 2123  Weight: 71.7 kg    Examination:  GENERAL: No apparent distress.  Nontoxic. HEENT: MMM.  Vision and hearing grossly intact.  NECK: Supple.  No apparent JVD.  RESP: 88% to 93% on 10L by HFNC.  No IWOB.  Good aeration  bilaterally. CVS:  RRR. Heart sounds normal.  ABD/GI/GU: BS+. Abd soft, NTND.  MSK/EXT:  Moves extremities. No apparent deformity. No edema.  SKIN: no apparent skin lesion or wound NEURO: Awake, alert and oriented appropriately.  No apparent focal neuro deficit. PSYCH: Calm. Normal affect.   Procedures:  None  Microbiology summarized: COVID-19 PCR positive Blood cultures negative.  Assessment & Plan: Acute respiratory failure with hypoxia due to COVID-19 pneumonia: Tested positive on 07/01/2020.  Increased inflammatory markers.  CTA chest negative for PE but groundglass opacities.  Procalcitonin negative. Inflammatory markers downtrended.  Oxygen requirement improved.  Now on 10 L by HFNC. Recent Labs    07/17/20 0242 07/18/20 0305 07/19/20 0301  DDIMER 1.65* 1.75* 1.69*  FERRITIN 1,047* 991* 882*  CRP 2.2* 1.3* 0.6  -Remdesivir 8/29-9/2 -Continue baricitinib 8/31>>  -Continue Solu-Medrol to 40 mg twice daily.  Previously on 80 mg twice daily.  Started on 8/29. -Supplemental oxygen, inhalers, mucolytic's, antitussives, vit C, Zinc,  IS and proning while awake -Continue monitoring inflammatory markers -Subcu Lovenox for VTE prophylaxis.  Hypokalemia-resolved -Continue to monitor  Mild transaminitis likely related to COVID-19.  Resolved.  Mild hyponatremia: Resolved. -Continue monitoring.  GERD -Continue PPI  Dyslipidemia -Continue atorvastatin  Hypertension/sinus tachycardia: Normotensive.  Tachycardia resolved. -Continue metoprolol  25 mg twice daily  Body mass index is 23.33 kg/m.         DVT prophylaxis:  enoxaparin (LOVENOX) injection 40 mg Start: 07/11/20 2200  Code Status: Full code Family Communication: Updated wife over the phone on 9/5. Status is: Inpatient  Remains inpatient appropriate because:IV treatments appropriate due to intensity of illness or inability to take PO, Inpatient level of care appropriate due to severity of illness and  Respiratory failure with significant oxygen requirement   Dispo: The patient is from: Home              Anticipated d/c is to: Home              Anticipated d/c date is: > 3 days              Patient currently is not medically stable to d/c.       Consultants:  PCCM   Sch Meds:  Scheduled Meds: . vitamin C  500 mg Oral Daily  . atorvastatin  20 mg Oral Daily  . baricitinib  4 mg Oral Daily  . chlorhexidine  15 mL Mouth Rinse BID  . Chlorhexidine Gluconate Cloth  6 each Topical Daily  . enoxaparin (LOVENOX) injection  40 mg Subcutaneous QHS  . fluticasone  1 spray Each Nare Daily  . loratadine  10 mg Oral Daily  . mouth rinse  15 mL Mouth Rinse q12n4p  . methylPREDNISolone (SOLU-MEDROL) injection  40 mg Intravenous BID  . metoprolol tartrate  25 mg Oral BID  . pantoprazole  40 mg Oral Daily  . zinc sulfate  220 mg Oral Daily   Continuous Infusions:  PRN Meds:.acetaminophen **OR** acetaminophen, albuterol, chlorpheniramine-HYDROcodone, cyclobenzaprine, guaiFENesin-dextromethorphan, LORazepam, Muscle Rub, ondansetron **OR** ondansetron (ZOFRAN) IV, oxyCODONE, sodium chloride  Antimicrobials: Anti-infectives (From admission, onward)   Start     Dose/Rate Route Frequency Ordered Stop   07/12/20 1000  remdesivir 100 mg in sodium chloride 0.9 % 100 mL IVPB       "Followed by" Linked Group Details   100 mg 200 mL/hr over 30 Minutes Intravenous Daily 07/11/20 0028 07/15/20 0922   07/11/20 1000  remdesivir 100 mg in sodium chloride 0.9 % 100 mL IVPB  Status:  Discontinued       "Followed by" Linked Group Details   100 mg 200 mL/hr over 30 Minutes Intravenous Daily 07/10/20 2338 07/11/20 0027   07/11/20 0030  remdesivir 100 mg in sodium chloride 0.9 % 100 mL IVPB       "Followed by" Linked Group Details   100 mg 200 mL/hr over 30 Minutes Intravenous Every 30 min 07/11/20 0028 07/11/20 0210   07/10/20 2345  remdesivir 200 mg in sodium chloride 0.9% 250 mL IVPB  Status:   Discontinued       "Followed by" Linked Group Details   200 mg 580 mL/hr over 30 Minutes Intravenous Once 07/10/20 2338 07/11/20 0027       I have personally reviewed the following labs and images: CBC: Recent Labs  Lab 07/13/20 0641 07/14/20 0643 07/15/20 0251 07/17/20 0242  WBC 16.7* 15.6* 15.2* 18.4*  NEUTROABS 14.7* 13.7* 14.0* 16.9*  HGB 14.3 14.6 15.0 15.2  HCT 43.1 43.0 43.1 44.8  MCV 87.8 85.8 84.2 87.0  PLT 592* 642* 571* 665*   BMP &GFR Recent Labs  Lab 07/13/20 0641 07/14/20 0643 07/15/20 0251 07/17/20 0242  NA 137 134* 135 137  K 4.1 3.9 4.5 4.6  CL 104 99 100 98  CO2 23 21* 23 27  GLUCOSE 142* 141* 157* 153*  BUN 19 16 19  26*  CREATININE 0.75 0.67 0.80 0.73  CALCIUM 8.6* 8.8* 8.8* 9.0  MG 2.5* 2.3 2.4  --   PHOS 4.2 4.1 3.6  --    Estimated Creatinine Clearance: 128.9 mL/min (by C-G formula based on SCr of 0.73 mg/dL). Liver & Pancreas: Recent Labs  Lab 07/13/20 0641 07/14/20 0643 07/15/20 0251 07/17/20 0242  AST 43* 30 26 19   ALT 67* 51* 42 33  ALKPHOS 56 57 50 58  BILITOT 1.4* 1.7* 1.6* 1.5*  PROT 6.8 7.5 7.3 7.2  ALBUMIN 3.3* 3.5 3.3* 3.5   No results for input(s): LIPASE, AMYLASE in the last 168 hours. No results for input(s): AMMONIA in the last 168 hours. Diabetic: No results for input(s): HGBA1C in the last 72 hours. No results for input(s): GLUCAP in the last 168 hours. Cardiac Enzymes: No results for input(s): CKTOTAL, CKMB, CKMBINDEX, TROPONINI in the last 168 hours. No results for input(s): PROBNP in the last 8760 hours. Coagulation Profile: No results for input(s): INR, PROTIME in the last 168 hours. Thyroid Function Tests: No results for input(s): TSH, T4TOTAL, FREET4, T3FREE, THYROIDAB in the last 72 hours. Lipid Profile: No results for input(s): CHOL, HDL, LDLCALC, TRIG, CHOLHDL, LDLDIRECT in the last 72 hours. Anemia Panel: Recent Labs    07/18/20 0305 07/19/20 0301  FERRITIN 991* 882*   Urine analysis: No  results found for: COLORURINE, APPEARANCEUR, LABSPEC, PHURINE, GLUCOSEU, HGBUR, BILIRUBINUR, KETONESUR, PROTEINUR, UROBILINOGEN, NITRITE, LEUKOCYTESUR Sepsis Labs: Invalid input(s): PROCALCITONIN, [place]  Microbiology: Recent Results (from the past 240 hour(s))  Blood Culture (routine x 2)     Status: None   Collection Time: 07/10/20 10:00 PM   Specimen: BLOOD  Result Value Ref Range Status   Specimen Description BLOOD LEFT ANTECUBITAL  Final   Special Requests   Final    BOTTLES DRAWN AEROBIC AND ANAEROBIC Blood Culture adequate volume   Culture   Final    NO GROWTH 5 DAYS Performed at [facility], [address], [place], [place] [address]    Report Status 07/15/2020 FINAL  Final  Blood Culture (routine x 2)     Status: None   Collection Time: 07/10/20 11:14 PM   Specimen: BLOOD  Result Value Ref Range Status   Specimen Description BLOOD RIGHT ANTECUBITAL  Final   Special Requests   Final    BOTTLES DRAWN AEROBIC AND ANAEROBIC Blood Culture adequate volume   Culture   Final    NO GROWTH 5 DAYS Performed at [facility], [address], [place], [place] [address]    Report Status 07/15/2020 FINAL  Final  SARS Coronavirus 2 by RT PCR (hospital order, performed in [facility] hospital lab) Nasopharyngeal Nasopharyngeal Swab     Status: Abnormal   Collection Time: 07/11/20  1:20 AM   Specimen: Nasopharyngeal Swab  Result Value Ref Range Status   SARS Coronavirus 2 POSITIVE (A) NEGATIVE Final    Comment: RESULT CALLED TO, READ BACK BY AND VERIFIED WITH: T WALKER,RN @[id number]  07/11/20 MKELLY (NOTE) SARS-CoV-2 target nucleic acids are DETECTED  SARS-CoV-2 RNA is generally detectable in upper respiratory specimens  during the acute phase of infection.  Positive results are indicative  of the presence of the identified virus, but do not rule out bacterial infection or co-infection with other pathogens not detected by the test.  Clinical correlation with patient history and   other diagnostic information is necessary to determine patient  infection status.  The expected result is negative.  Fact Sheet for Patients:   StrictlyIdeas.no   Fact Sheet for Healthcare Providers:   BankingDealers.co.za    This test is not yet approved or cleared by the [place] FDA and  has been authorized for detection and/or diagnosis of SARS-CoV-2 by FDA under an Emergency Use Authorization (EUA).  This EUA will remain in effect (meaning this tes t can be used) for the duration of  the COVID-19 declaration under Section 564(b)(1) of the Act, 21 U.S.C. section 360-bbb-3(b)(1), unless the authorization is terminated or revoked sooner.  Performed at [facility], [address], [place], [place] [address]   Culture, blood (routine x 2)     Status: None   Collection Time: 07/13/20  7:21 PM   Specimen: Right Antecubital; Blood  Result Value Ref Range Status   Specimen Description RIGHT ANTECUBITAL  Final   Special Requests   Final    BOTTLES DRAWN AEROBIC AND ANAEROBIC Blood Culture adequate volume   Culture   Final    NO GROWTH 5 DAYS Performed at [facility], [address], [place], [place] [address]    Report Status 07/18/2020 FINAL  Final  Culture, blood (routine x 2)     Status: None   Collection Time: 07/13/20  7:21 PM   Specimen: Right Antecubital; Blood  Result Value Ref Range Status   Specimen Description RIGHT ANTECUBITAL  Final   Special Requests   Final    BOTTLES DRAWN AEROBIC AND ANAEROBIC Blood Culture adequate volume   Culture   Final    NO GROWTH 5 DAYS Performed at [facility], [address], [place], [place] [address]    Report Status 07/18/2020 FINAL  Final  MRSA PCR Screening     Status: None   Collection Time: 07/14/20 10:06 AM   Specimen: Nasal Mucosa; Nasopharyngeal  Result Value Ref Range Status   MRSA by PCR NEGATIVE NEGATIVE Final    Comment:        The GeneXpert MRSA Assay  (FDA approved for NASAL specimens only), is one component of a comprehensive MRSA colonization surveillance program. It is not intended to diagnose MRSA infection nor to guide or monitor treatment for MRSA infections. Performed at [facility], [place] [address] [place], [place] [address]     Radiology Studies: No results found.  Amritha Yorke T. [place]  If 7PM-7AM, please contact night-coverage [email or web address] 07/19/2020, 11:21 AM

## 2020-07-20 LAB — FERRITIN: Ferritin: 844 ng/mL — ABNORMAL HIGH (ref 24–336)

## 2020-07-20 LAB — C-REACTIVE PROTEIN: CRP: 0.7 mg/dL (ref <1.0)

## 2020-07-20 LAB — D-DIMER, QUANTITATIVE: D-Dimer, Quant: 1.54 ug/mL-FEU — ABNORMAL HIGH (ref 0.00–0.50)

## 2020-07-20 NOTE — Progress Notes (Signed)
Patient is at 10L HFNC O2 saturation 87-90%. Patient educated on proning 2-3 hours per 12hr period per MD order. Patient is actively using his incentive spirometer and Flutter valve. Will continue to monitor closely.

## 2020-07-20 NOTE — Progress Notes (Signed)
PROGRESS NOTE  Kent Hernandez IRS:[id number] DOB: 01/30/1986   PCP: Susy Frizzle, MD  Patient is from: Home  DOA: 07/10/2020 LOS: 40  Brief Narrative / Interim history: 34 year old male with history of cervical DDD, HTN, GERD, H. pylori infection and allergic rhinitis presenting with COVID-19 symptoms.  He tested positive on 07/01/2020 at [facility].  He was exposed to his wife who had URI symptoms before him.  In ED, he was hypoxic requiring 4 L.  Inflammatory markers elevated.  CTA chest negative for PE but bilateral groundglass opacities.  Patient was started on steroid, remdesivir and admitted.   Patient had increased oxygen requirement.  Baricitinib added on 07/13/2020.  He was transferred to [facility] as there was not SDU or ICU bed at [facility].  On arrival here, sitting in upper 80s to lower 90s 35 L / 100% HFNC and NRB.  Solu-Medrol dose increased.  PCCM consulted.  Patient is now down to 9L by HFNC.   Subjective: Seen and examined earlier this morning.  No major events overnight of this morning.  No complaints.  Reports improvement in his breathing and cough.  Down to 9 L by HFNC.  However, desatted to low 80s sitting up in bed for lung exam although he did not feel respiratory distress.  Objective: Vitals:   07/20/20 0000 07/20/20 0358 07/20/20 0400 07/20/20 0800  BP: 114/83  119/81   Pulse: 67  67   Resp: 14  17   Temp:  97.6 F (36.4 C)  97.8 F (36.6 C)  TempSrc:  Oral  Oral  SpO2: 92%  92%   Weight:      Height:        Intake/Output Summary (Last 24 hours) at 07/20/2020 1102 Last data filed at 07/20/2020 0358 Gross per 24 hour  Intake --  Output 1280 ml  Net -1280 ml   Filed Weights   07/10/20 2123  Weight: 71.7 kg    Examination:  GENERAL: No apparent distress.  Nontoxic. HEENT: MMM.  Vision and hearing grossly intact.  NECK: Supple.  No apparent JVD.  RESP: 83 to 88% on 9 L by HFNC.  No IWOB.  Good aeration bilaterally. CVS:  RRR.  Heart sounds normal.  ABD/GI/GU: BS+. Abd soft, NTND.  MSK/EXT:  Moves extremities. No apparent deformity. No edema.  SKIN: no apparent skin lesion or wound NEURO: Awake, alert and oriented appropriately.  No apparent focal neuro deficit. PSYCH: Calm. Normal affect.   Procedures:  None  Microbiology summarized: COVID-19 PCR positive Blood cultures negative.  Assessment & Plan: Acute respiratory failure with hypoxia due to COVID-19 pneumonia: Tested positive on 07/01/2020.  Increased inflammatory markers.  CTA chest negative for PE but groundglass opacities.  Procalcitonin negative. Inflammatory markers downtrended.  Oxygen requirement slowly improving.  Now on 9 L by HFNC but desaturates to lower 80s with minimal activity. Recent Labs    07/18/20 0305 07/19/20 0301 07/20/20 0238  DDIMER 1.75* 1.69* 1.54*  FERRITIN 991* 882* 844*  CRP 1.3* 0.6 0.7  -Remdesivir 8/29-9/2 -Continue baricitinib 8/31>>  -Continue Solu-Medrol to 40 mg twice daily.  Will slowly wean -Supplemental oxygen, inhalers, mucolytic's, antitussives, vit C, Zinc,  IS and proning while awake -Continue monitoring inflammatory markers -Subcu Lovenox for VTE prophylaxis.  Hypokalemia-resolved -Continue to monitor  Mild transaminitis likely related to COVID-19.  Resolved.  Mild hyponatremia: Resolved. -Continue monitoring.  GERD -Continue PPI  Dyslipidemia -Continue atorvastatin  Hypertension/sinus tachycardia: Normotensive.  Tachycardia resolved. -Continue metoprolol  25 mg twice daily  Body mass index is 23.33 kg/m.         DVT prophylaxis:  enoxaparin (LOVENOX) injection 40 mg Start: 07/11/20 2200  Code Status: Full code Family Communication: Updated wife over the phone Status is: Inpatient  Remains inpatient appropriate because:IV treatments appropriate due to intensity of illness or inability to take PO, Inpatient level of care appropriate due to severity of illness and Respiratory  failure with significant oxygen requirement   Dispo: The patient is from: Home              Anticipated d/c is to: Home              Anticipated d/c date is: > 3 days              Patient currently is not medically stable to d/c.   Consultants:  PCCM   Sch Meds:  Scheduled Meds: . vitamin C  500 mg Oral Daily  . atorvastatin  20 mg Oral Daily  . baricitinib  4 mg Oral Daily  . chlorhexidine  15 mL Mouth Rinse BID  . Chlorhexidine Gluconate Cloth  6 each Topical Daily  . enoxaparin (LOVENOX) injection  40 mg Subcutaneous QHS  . fluticasone  1 spray Each Nare Daily  . loratadine  10 mg Oral Daily  . mouth rinse  15 mL Mouth Rinse q12n4p  . methylPREDNISolone (SOLU-MEDROL) injection  40 mg Intravenous BID  . metoprolol tartrate  25 mg Oral BID  . pantoprazole  40 mg Oral Daily  . zinc sulfate  220 mg Oral Daily   Continuous Infusions:  PRN Meds:.acetaminophen **OR** acetaminophen, albuterol, alum & mag hydroxide-simeth, chlorpheniramine-HYDROcodone, cyclobenzaprine, guaiFENesin-dextromethorphan, LORazepam, Muscle Rub, ondansetron **OR** ondansetron (ZOFRAN) IV, oxyCODONE, sodium chloride  Antimicrobials: Anti-infectives (From admission, onward)   Start     Dose/Rate Route Frequency Ordered Stop   07/12/20 1000  remdesivir 100 mg in sodium chloride 0.9 % 100 mL IVPB       "Followed by" Linked Group Details   100 mg 200 mL/hr over 30 Minutes Intravenous Daily 07/11/20 0028 07/15/20 0922   07/11/20 1000  remdesivir 100 mg in sodium chloride 0.9 % 100 mL IVPB  Status:  Discontinued       "Followed by" Linked Group Details   100 mg 200 mL/hr over 30 Minutes Intravenous Daily 07/10/20 2338 07/11/20 0027   07/11/20 0030  remdesivir 100 mg in sodium chloride 0.9 % 100 mL IVPB       "Followed by" Linked Group Details   100 mg 200 mL/hr over 30 Minutes Intravenous Every 30 min 07/11/20 0028 07/11/20 0210   07/10/20 2345  remdesivir 200 mg in sodium chloride 0.9% 250 mL IVPB   Status:  Discontinued       "Followed by" Linked Group Details   200 mg 580 mL/hr over 30 Minutes Intravenous Once 07/10/20 2338 07/11/20 0027       I have personally reviewed the following labs and images: CBC: Recent Labs  Lab 07/14/20 0643 07/15/20 0251 07/17/20 0242  WBC 15.6* 15.2* 18.4*  NEUTROABS 13.7* 14.0* 16.9*  HGB 14.6 15.0 15.2  HCT 43.0 43.1 44.8  MCV 85.8 84.2 87.0  PLT 642* 571* 665*   BMP &GFR Recent Labs  Lab 07/14/20 0643 07/15/20 0251 07/17/20 0242  NA 134* 135 137  K 3.9 4.5 4.6  CL 99 100 98  CO2 21* 23 27  GLUCOSE 141* 157* 153*  BUN 16 19 26*  CREATININE 0.67 0.80 0.73  CALCIUM 8.8* 8.8* 9.0  MG 2.3 2.4  --   PHOS 4.1 3.6  --    Estimated Creatinine Clearance: 128.9 mL/min (by C-G formula based on SCr of 0.73 mg/dL). Liver & Pancreas: Recent Labs  Lab 07/14/20 0643 07/15/20 0251 07/17/20 0242  AST 30 26 19   ALT 51* 42 33  ALKPHOS 57 50 58  BILITOT 1.7* 1.6* 1.5*  PROT 7.5 7.3 7.2  ALBUMIN 3.5 3.3* 3.5   No results for input(s): LIPASE, AMYLASE in the last 168 hours. No results for input(s): AMMONIA in the last 168 hours. Diabetic: No results for input(s): HGBA1C in the last 72 hours. No results for input(s): GLUCAP in the last 168 hours. Cardiac Enzymes: No results for input(s): CKTOTAL, CKMB, CKMBINDEX, TROPONINI in the last 168 hours. No results for input(s): PROBNP in the last 8760 hours. Coagulation Profile: No results for input(s): INR, PROTIME in the last 168 hours. Thyroid Function Tests: No results for input(s): TSH, T4TOTAL, FREET4, T3FREE, THYROIDAB in the last 72 hours. Lipid Profile: No results for input(s): CHOL, HDL, LDLCALC, TRIG, CHOLHDL, LDLDIRECT in the last 72 hours. Anemia Panel: Recent Labs    07/19/20 0301 07/20/20 0238  FERRITIN 882* 844*   Urine analysis: No results found for: COLORURINE, APPEARANCEUR, LABSPEC, PHURINE, GLUCOSEU, HGBUR, BILIRUBINUR, KETONESUR, PROTEINUR, UROBILINOGEN, NITRITE,  LEUKOCYTESUR Sepsis Labs: Invalid input(s): PROCALCITONIN, [place]  Microbiology: Recent Results (from the past 240 hour(s))  Blood Culture (routine x 2)     Status: None   Collection Time: 07/10/20 10:00 PM   Specimen: BLOOD  Result Value Ref Range Status   Specimen Description BLOOD LEFT ANTECUBITAL  Final   Special Requests   Final    BOTTLES DRAWN AEROBIC AND ANAEROBIC Blood Culture adequate volume   Culture   Final    NO GROWTH 5 DAYS Performed at [facility], [address], [place], [place] [address]    Report Status 07/15/2020 FINAL  Final  Blood Culture (routine x 2)     Status: None   Collection Time: 07/10/20 11:14 PM   Specimen: BLOOD  Result Value Ref Range Status   Specimen Description BLOOD RIGHT ANTECUBITAL  Final   Special Requests   Final    BOTTLES DRAWN AEROBIC AND ANAEROBIC Blood Culture adequate volume   Culture   Final    NO GROWTH 5 DAYS Performed at [facility], [address], [place], [place] [address]    Report Status 07/15/2020 FINAL  Final  SARS Coronavirus 2 by RT PCR (hospital order, performed in [facility] hospital lab) Nasopharyngeal Nasopharyngeal Swab     Status: Abnormal   Collection Time: 07/11/20  1:20 AM   Specimen: Nasopharyngeal Swab  Result Value Ref Range Status   SARS Coronavirus 2 POSITIVE (A) NEGATIVE Final    Comment: RESULT CALLED TO, READ BACK BY AND VERIFIED WITH: Andres Ege @[id number]  07/11/20 MKELLY (NOTE) SARS-CoV-2 target nucleic acids are DETECTED  SARS-CoV-2 RNA is generally detectable in upper respiratory specimens  during the acute phase of infection.  Positive results are indicative  of the presence of the identified virus, but do not rule out bacterial infection or co-infection with other pathogens not detected by the test.  Clinical correlation with patient history and  other diagnostic information is necessary to determine patient infection status.  The expected result is negative.  Fact Sheet for  Patients:   StrictlyIdeas.no   Fact Sheet for Healthcare Providers:   BankingDealers.co.za    This  test is not yet approved or cleared by the [place] and  has been authorized for detection and/or diagnosis of SARS-CoV-2 by FDA under an Emergency Use Authorization (EUA).  This EUA will remain in effect (meaning this tes t can be used) for the duration of  the COVID-19 declaration under Section 564(b)(1) of the Act, 21 U.S.C. section 360-bbb-3(b)(1), unless the authorization is terminated or revoked sooner.  Performed at [facility], [address], [place], [place] [address]   Culture, blood (routine x 2)     Status: None   Collection Time: 07/13/20  7:21 PM   Specimen: Right Antecubital; Blood  Result Value Ref Range Status   Specimen Description RIGHT ANTECUBITAL  Final   Special Requests   Final    BOTTLES DRAWN AEROBIC AND ANAEROBIC Blood Culture adequate volume   Culture   Final    NO GROWTH 5 DAYS Performed at [facility], [address] [place], [place] [address]    Report Status 07/18/2020 FINAL  Final  Culture, blood (routine x 2)     Status: None   Collection Time: 07/13/20  7:21 PM   Specimen: Right Antecubital; Blood  Result Value Ref Range Status   Specimen Description RIGHT ANTECUBITAL  Final   Special Requests   Final    BOTTLES DRAWN AEROBIC AND ANAEROBIC Blood Culture adequate volume   Culture   Final    NO GROWTH 5 DAYS Performed at [facility], [address] [place], [place] [address]    Report Status 07/18/2020 FINAL  Final  MRSA PCR Screening     Status: None   Collection Time: 07/14/20 10:06 AM   Specimen: Nasal Mucosa; Nasopharyngeal  Result Value Ref Range Status   MRSA by PCR NEGATIVE NEGATIVE Final    Comment:        The GeneXpert MRSA Assay (FDA approved for NASAL specimens only), is one component of a comprehensive MRSA colonization surveillance program. It is not intended to  diagnose MRSA infection nor to guide or monitor treatment for MRSA infections. Performed at [facility], [place] [address] [place], [place] [address]     Radiology Studies: No results found.  Zhana Jeangilles T. Silverthorne  If 7PM-7AM, please contact night-coverage [email or web address] 07/20/2020, 11:02 AM

## 2020-07-21 LAB — C-REACTIVE PROTEIN: CRP: 0.9 mg/dL (ref <1.0)

## 2020-07-21 LAB — D-DIMER, QUANTITATIVE: D-Dimer, Quant: 1 ug/mL-FEU — ABNORMAL HIGH (ref 0.00–0.50)

## 2020-07-21 LAB — FERRITIN: Ferritin: 846 ng/mL — ABNORMAL HIGH (ref 24–336)

## 2020-07-21 MED ORDER — PREDNISONE 5 MG PO TABS: 5.0000 mg | ORAL_TABLET | Freq: Every day | ORAL | Status: DC

## 2020-07-21 MED ORDER — PREDNISONE 20 MG PO TABS
40.0000 mg | ORAL_TABLET | Freq: Every day | ORAL | Status: AC
  Administered 2020-07-21 – 2020-07-23 (×3): 40 mg via ORAL
  Filled 2020-07-21 (×3): qty 2

## 2020-07-21 MED ORDER — PREDNISONE 20 MG PO TABS
30.0000 mg | ORAL_TABLET | Freq: Every day | ORAL | Status: DC
  Administered 2020-07-24: 30 mg via ORAL
  Filled 2020-07-21: qty 1

## 2020-07-21 MED ORDER — PREDNISONE 10 MG PO TABS: 10.0000 mg | ORAL_TABLET | Freq: Every day | ORAL | Status: DC

## 2020-07-21 MED ORDER — PREDNISONE 20 MG PO TABS: 20.0000 mg | ORAL_TABLET | Freq: Every day | ORAL | Status: DC

## 2020-07-21 NOTE — Evaluation (Signed)
Physical Therapy Evaluation Patient Details Name: Kent Hernandez MRN: [id number] DOB: Oct 26, 1986 Today's Date: 07/21/2020   History of Present Illness  34 yo male admitted with COVID.  Clinical Impression  On eval, pt was Min guard assist for mobility. He is unsteady and generally weak-LOB x 1. O2 90% on 10L at rest, 82% on 10L with activity. Pt was able to walk around the room x 3 and perform sit to stands x 7 reps with rest breaks taken as needed. Will plan to follow and progress activity as tolerated. Encouraged pt to mobilize and exercise as able. At this time, recommendation is for HHPT f/u, if pt is agreeable.     Follow Up Recommendations Home health PT    Equipment Recommendations  None recommended by PT    Recommendations for Other Services       Precautions / Restrictions Precautions Precautions: Fall Precaution Comments: monitor O2 Restrictions Weight Bearing Restrictions: No      Mobility  Bed Mobility               General bed mobility comments: oob in recliner  Transfers Overall transfer level: Needs assistance   Transfers: Sit to/from Stand Sit to Stand: Supervision            Ambulation/Gait Ambulation/Gait assistance: Min guard Gait Distance (Feet): 40 Feet (40'x1, 20'x1) Assistive device: None Gait Pattern/deviations: Step-through pattern;Decreased stride length     General Gait Details: Slow guarded, unsteady gait. LOB x 1. O2 82% on 10L  Stairs            Wheelchair Mobility    Modified Rankin (Stroke Patients Only)       Balance Overall balance assessment: Needs assistance           Standing balance-Leahy Scale: Fair                               Pertinent Vitals/Pain Pain Assessment: No/denies pain    Home Living Family/patient expects to be discharged to:: Private residence Living Arrangements: Spouse/significant other Available Help at Discharge: Family Type of Home: House Home Access: Stairs  to enter   Technical brewer of Steps: 4   Home Equipment: None      Prior Function Level of Independence: Independent               Hand Dominance        Extremity/Trunk Assessment   Upper Extremity Assessment Upper Extremity Assessment: Overall WFL for tasks assessed    Lower Extremity Assessment Lower Extremity Assessment: Generalized weakness    Cervical / Trunk Assessment Cervical / Trunk Assessment: Normal  Communication   Communication: No difficulties  Cognition Arousal/Alertness: Awake/alert Behavior During Therapy: WFL for tasks assessed/performed Overall Cognitive Status: Within Functional Limits for tasks assessed                                        General Comments      Exercises Other Exercises Other Exercises: 7 sit to stands (in a row) without using UEs   Assessment/Plan    PT Assessment Patient needs continued PT services  PT Problem List Decreased mobility;Decreased balance;Decreased activity tolerance;Cardiopulmonary status limiting activity       PT Treatment Interventions DME instruction;Gait training;Therapeutic activities;Therapeutic exercise;Patient/family education;Functional mobility training;Balance training    PT Goals (Current goals can be  found in the Care Plan section)  Acute Rehab PT Goals Patient Stated Goal: regain plof PT Goal Formulation: With patient Time For Goal Achievement: 08/04/20 Potential to Achieve Goals: Good    Frequency Min 3X/week   Barriers to discharge        Co-evaluation               AM-PAC PT "6 Clicks" Mobility  Outcome Measure Help needed turning from your back to your side while in a flat bed without using bedrails?: None Help needed moving from lying on your back to sitting on the side of a flat bed without using bedrails?: None Help needed moving to and from a bed to a chair (including a wheelchair)?: A Little Help needed standing up from a chair using  your arms (e.g., wheelchair or bedside chair)?: A Little Help needed to walk in hospital room?: A Little Help needed climbing 3-5 steps with a railing? : A Little 6 Click Score: 20    End of Session Equipment Utilized During Treatment: Oxygen Activity Tolerance: Patient tolerated treatment well Patient left: in chair;with call bell/phone within reach   PT Visit Diagnosis: Unsteadiness on feet (R26.81);Difficulty in walking, not elsewhere classified (R26.2)    Time: 7858-8502 PT Time Calculation (min) (ACUTE ONLY): 19 min   Charges:   PT Evaluation $PT Eval Low Complexity: [place], PT Acute Rehabilitation  Office: [phone] Pager: [phone]

## 2020-07-21 NOTE — Progress Notes (Signed)
PROGRESS NOTE  Kent Hernandez YTK:[id number] DOB: [birth year]   PCP: Susy Frizzle, MD  Patient is from: Home  DOA: 07/10/2020 LOS: 8  Brief Narrative / Interim history: 34 year old male with history of cervical DDD, HTN, GERD, H. pylori infection and allergic rhinitis presenting with COVID-19 symptoms.  He tested positive on 07/01/2020 at [facility].  He was exposed to his wife who had URI symptoms before him.  In ED, he was hypoxic requiring 4 L.  Inflammatory markers elevated.  CTA chest negative for PE but bilateral groundglass opacities.  Patient was started on steroid, remdesivir and admitted.   Patient had increased oxygen requirement.  Baricitinib added on 07/13/2020.  He was transferred to [facility] as there was not SDU or ICU bed at [facility].  On arrival here, sitting in upper 80s to lower 90s 35 L / 100% HFNC and NRB.  Solu-Medrol dose increased.  PCCM consulted.  Patient is now down to 10L by HFNC.   Subjective: Seen and examined earlier this morning.  No major events overnight of this morning.  No complaints.  He denies shortness of breath.  Cough has improved.  However, he continues to require 2 L to maintain saturation in upper 80s to lower 90s.  Denies GI or UTI symptoms.  Objective: Vitals:   07/20/20 2311 07/21/20 0258 07/21/20 1348 07/21/20 1400  BP: 109/73 113/65 (!) 104/92   Pulse: 76 68 87   Resp: 18 16  (!) 22  Temp: 98.1 F (36.7 C) 97.8 F (36.6 C) 97.6 F (36.4 C)   TempSrc: Oral Oral Oral   SpO2: 92% 96% (!) 89%   Weight:      Height:        Intake/Output Summary (Last 24 hours) at 07/21/2020 1459 Last data filed at 07/21/2020 0421 Gross per 24 hour  Intake 360 ml  Output 525 ml  Net -165 ml   Filed Weights   07/10/20 2123  Weight: 71.7 kg    Examination:  GENERAL: No apparent distress.  Nontoxic. HEENT: MMM.  Vision and hearing grossly intact.  NECK: Supple.  No apparent JVD.  RESP: 87 to 92% on 3 L by HFNC.  No IWOB.   Good aeration.  No crackles. CVS:  RRR. Heart sounds normal.  ABD/GI/GU: BS+. Abd soft, NTND.  MSK/EXT:  Moves extremities. No apparent deformity. No edema.  SKIN: no apparent skin lesion or wound NEURO: Awake, alert and oriented appropriately.  No apparent focal neuro deficit. PSYCH: Calm. Normal affect.  Procedures:  None  Microbiology summarized: COVID-19 PCR positive Blood cultures negative.  Assessment & Plan: Acute respiratory failure with hypoxia due to COVID-19 pneumonia: Tested positive on 07/01/2020.  Increased inflammatory markers.  CTA chest negative for PE but groundglass opacities.  Procalcitonin negative. Inflammatory markers downtrended.  Continues to require 2 L by HFNC despite improvement in his symptoms and inflammatory markers. Recent Labs    07/19/20 0301 07/20/20 0238 07/21/20 0410  DDIMER 1.69* 1.54* 1.00*  FERRITIN 882* 844* 846*  CRP 0.6 0.7 0.9  -Remdesivir 8/29-9/2 -Continue baricitinib 8/31>>  -Continue Solu-Medrol 8/29-9/7.  On prednisone taper -Supplemental oxygen, inhalers, mucolytic/antitussive, vit C, Zinc, IS, prone while awake and PT to mobilize -Continue monitoring inflammatory markers -Subcu Lovenox for VTE prophylaxis. -PT to mobilize  Hypokalemia-resolved -Continue to monitor  Mild transaminitis likely related to COVID-19.  Resolved.  Mild hyponatremia: Resolved. -Continue monitoring.  GERD -Continue PPI  Dyslipidemia -Continue atorvastatin  Hypertension/sinus tachycardia: Normotensive.  Tachycardia resolved. -  Continue metoprolol 25 mg twice daily  Body mass index is 23.33 kg/m.         DVT prophylaxis:  enoxaparin (LOVENOX) injection 40 mg Start: 07/11/20 2200  Code Status: Full code Family Communication: Updated wife over the phone Status is: Inpatient  Remains inpatient appropriate because:Inpatient level of care appropriate due to severity of illness and Respiratory failure with significant oxygen  requirement, 10 L by HFNC to maintain appropriate saturation   Dispo: The patient is from: Home              Anticipated d/c is to: Home              Anticipated d/c date is: > 3 days              Patient currently is not medically stable to d/c.   Consultants:  PCCM   Sch Meds:  Scheduled Meds: . vitamin C  500 mg Oral Daily  . atorvastatin  20 mg Oral Daily  . baricitinib  4 mg Oral Daily  . chlorhexidine  15 mL Mouth Rinse BID  . Chlorhexidine Gluconate Cloth  6 each Topical Daily  . enoxaparin (LOVENOX) injection  40 mg Subcutaneous QHS  . fluticasone  1 spray Each Nare Daily  . loratadine  10 mg Oral Daily  . mouth rinse  15 mL Mouth Rinse q12n4p  . metoprolol tartrate  25 mg Oral BID  . pantoprazole  40 mg Oral Daily  . predniSONE  40 mg Oral Q breakfast   Followed by  . [START ON 07/24/2020] predniSONE  30 mg Oral Q breakfast   Followed by  . [START ON 07/27/2020] predniSONE  20 mg Oral Q breakfast   Followed by  . [START ON 07/30/2020] predniSONE  10 mg Oral Q breakfast   Followed by  . [START ON 08/02/2020] predniSONE  5 mg Oral Q breakfast  . zinc sulfate  220 mg Oral Daily   Continuous Infusions:  PRN Meds:.acetaminophen **OR** acetaminophen, albuterol, alum & mag hydroxide-simeth, chlorpheniramine-HYDROcodone, cyclobenzaprine, guaiFENesin-dextromethorphan, LORazepam, Muscle Rub, ondansetron **OR** ondansetron (ZOFRAN) IV, oxyCODONE, sodium chloride  Antimicrobials: Anti-infectives (From admission, onward)   Start     Dose/Rate Route Frequency Ordered Stop   07/12/20 1000  remdesivir 100 mg in sodium chloride 0.9 % 100 mL IVPB       "Followed by" Linked Group Details   100 mg 200 mL/hr over 30 Minutes Intravenous Daily 07/11/20 0028 07/15/20 0922   07/11/20 1000  remdesivir 100 mg in sodium chloride 0.9 % 100 mL IVPB  Status:  Discontinued       "Followed by" Linked Group Details   100 mg 200 mL/hr over 30 Minutes Intravenous Daily 07/10/20 2338 07/11/20 0027    07/11/20 0030  remdesivir 100 mg in sodium chloride 0.9 % 100 mL IVPB       "Followed by" Linked Group Details   100 mg 200 mL/hr over 30 Minutes Intravenous Every 30 min 07/11/20 0028 07/11/20 0210   07/10/20 2345  remdesivir 200 mg in sodium chloride 0.9% 250 mL IVPB  Status:  Discontinued       "Followed by" Linked Group Details   200 mg 580 mL/hr over 30 Minutes Intravenous Once 07/10/20 2338 07/11/20 0027       I have personally reviewed the following labs and images: CBC: Recent Labs  Lab 07/15/20 0251 07/17/20 0242  WBC 15.2* 18.4*  NEUTROABS 14.0* 16.9*  HGB 15.0 15.2  HCT 43.1 44.8  MCV 84.2 87.0  PLT 571* 665*   BMP &GFR Recent Labs  Lab 07/15/20 0251 07/17/20 0242  NA 135 137  K 4.5 4.6  CL 100 98  CO2 23 27  GLUCOSE 157* 153*  BUN 19 26*  CREATININE 0.80 0.73  CALCIUM 8.8* 9.0  MG 2.4  --   PHOS 3.6  --    Estimated Creatinine Clearance: 128.9 mL/min (by C-G formula based on SCr of 0.73 mg/dL). Liver & Pancreas: Recent Labs  Lab 07/15/20 0251 07/17/20 0242  AST 26 19  ALT 42 33  ALKPHOS 50 58  BILITOT 1.6* 1.5*  PROT 7.3 7.2  ALBUMIN 3.3* 3.5   No results for input(s): LIPASE, AMYLASE in the last 168 hours. No results for input(s): AMMONIA in the last 168 hours. Diabetic: No results for input(s): HGBA1C in the last 72 hours. No results for input(s): GLUCAP in the last 168 hours. Cardiac Enzymes: No results for input(s): CKTOTAL, CKMB, CKMBINDEX, TROPONINI in the last 168 hours. No results for input(s): PROBNP in the last 8760 hours. Coagulation Profile: No results for input(s): INR, PROTIME in the last 168 hours. Thyroid Function Tests: No results for input(s): TSH, T4TOTAL, FREET4, T3FREE, THYROIDAB in the last 72 hours. Lipid Profile: No results for input(s): CHOL, HDL, LDLCALC, TRIG, CHOLHDL, LDLDIRECT in the last 72 hours. Anemia Panel: Recent Labs    07/20/20 0238 07/21/20 0410  FERRITIN 844* 846*   Urine analysis: No  results found for: COLORURINE, APPEARANCEUR, LABSPEC, PHURINE, GLUCOSEU, HGBUR, BILIRUBINUR, KETONESUR, PROTEINUR, UROBILINOGEN, NITRITE, LEUKOCYTESUR Sepsis Labs: Invalid input(s): PROCALCITONIN, [place]  Microbiology: Recent Results (from the past 240 hour(s))  Culture, blood (routine x 2)     Status: None   Collection Time: 07/13/20  7:21 PM   Specimen: Right Antecubital; Blood  Result Value Ref Range Status   Specimen Description RIGHT ANTECUBITAL  Final   Special Requests   Final    BOTTLES DRAWN AEROBIC AND ANAEROBIC Blood Culture adequate volume   Culture   Final    NO GROWTH 5 DAYS Performed at [facility], [address], [place], [place] [address]    Report Status 07/18/2020 FINAL  Final  Culture, blood (routine x 2)     Status: None   Collection Time: 07/13/20  7:21 PM   Specimen: Right Antecubital; Blood  Result Value Ref Range Status   Specimen Description RIGHT ANTECUBITAL  Final   Special Requests   Final    BOTTLES DRAWN AEROBIC AND ANAEROBIC Blood Culture adequate volume   Culture   Final    NO GROWTH 5 DAYS Performed at [facility], [address] [place], [place] [address]    Report Status 07/18/2020 FINAL  Final  MRSA PCR Screening     Status: None   Collection Time: 07/14/20 10:06 AM   Specimen: Nasal Mucosa; Nasopharyngeal  Result Value Ref Range Status   MRSA by PCR NEGATIVE NEGATIVE Final    Comment:        The GeneXpert MRSA Assay (FDA approved for NASAL specimens only), is one component of a comprehensive MRSA colonization surveillance program. It is not intended to diagnose MRSA infection nor to guide or monitor treatment for MRSA infections. Performed at [facility], [place] [address], [place], [place] [address]     Radiology Studies: No results found.  Yi Haugan T. [place]  If 7PM-7AM, please contact night-coverage [email or web address] 07/21/2020, 2:59 PM

## 2020-07-22 LAB — D-DIMER, QUANTITATIVE: D-Dimer, Quant: 0.92 ug/mL-FEU — ABNORMAL HIGH (ref 0.00–0.50)

## 2020-07-22 LAB — C-REACTIVE PROTEIN: CRP: 1 mg/dL — ABNORMAL HIGH (ref <1.0)

## 2020-07-22 LAB — FERRITIN: Ferritin: 803 ng/mL — ABNORMAL HIGH (ref 24–336)

## 2020-07-22 NOTE — Progress Notes (Signed)
PROGRESS NOTE  OAKLAND FANT DJM:[id number] DOB: [birth year]   PCP: Susy Frizzle, MD  Patient is from: Home  DOA: 07/10/2020 LOS: 97  Brief Narrative / Interim history: 34 year old male with history of cervical DDD, HTN, GERD, H. pylori infection and allergic rhinitis presenting with COVID-19 symptoms.  He tested positive on 07/01/2020 at [facility].  He was exposed to his wife who had URI symptoms before him.  In ED, he was hypoxic requiring 4 L.  Inflammatory markers elevated.  CTA chest negative for PE but bilateral groundglass opacities.  Patient was started on steroid, remdesivir and admitted.   Patient had increased oxygen requirement.  Baricitinib added on 07/13/2020.  He was transferred to [facility] as there was not SDU or ICU bed at [facility].  On arrival here, sitting in upper 80s to lower 90s 35 L / 100% HFNC and NRB.  Solu-Medrol dose increased.  PCCM consulted.  Patient is now down to 10L by HFNC.   Subjective: Seen and examined earlier this morning.  No major events overnight of this morning.  Ambulated with PT on 8 L by HFNC.  Saturation dropped to 85% but returned to 92% when he returned to rest.  Actively working on incentive spirometry and mobility.  Very motivated.  No complaints.  Denies dyspnea, cough, chest pain, GI or UTI symptoms.  Objective: Vitals:   07/21/20 2200 07/22/20 0350 07/22/20 0857 07/22/20 1339  BP:  103/65  114/78  Pulse:  71  86  Resp:  15  18  Temp:  97.8 F (36.6 C)  97.9 F (36.6 C)  TempSrc:  Oral  Oral  SpO2: 95% 95% 91% 93%  Weight:      Height:        Intake/Output Summary (Last 24 hours) at 07/22/2020 1433 Last data filed at 07/22/2020 1030 Gross per 24 hour  Intake 480 ml  Output 1550 ml  Net -1070 ml   Filed Weights   07/10/20 2123  Weight: 71.7 kg    Examination:  GENERAL: No apparent distress.  Nontoxic. HEENT: MMM.  Vision and hearing grossly intact.  NECK: Supple.  No apparent JVD.  RESP: 84 to 93%  on 10 L.  No IWOB.  Fair aeration bilaterally. CVS:  RRR. Heart sounds normal.  ABD/GI/GU: BS+. Abd soft, NTND.  MSK/EXT:  Moves extremities. No apparent deformity. No edema.  SKIN: no apparent skin lesion or wound NEURO: Awake, alert and oriented appropriately.  No apparent focal neuro deficit. PSYCH: Calm. Normal affect.  Procedures:  None  Microbiology summarized: COVID-19 PCR positive Blood cultures negative.  Assessment & Plan: Acute respiratory failure with hypoxia due to COVID-19 pneumonia: Tested positive on 07/01/2020.  Increased inflammatory markers.  CTA chest negative for PE but groundglass opacities.  Procalcitonin negative. Inflammatory markers downtrended.  Continues to require 10 L by HFNC despite improvement in his symptoms and inflammatory markers. Recent Labs    07/20/20 0238 07/21/20 0410 07/22/20 0359  DDIMER 1.54* 1.00* 0.92*  FERRITIN 844* 846* 803*  CRP 0.7 0.9 1.0*  -Remdesivir 8/29-9/2 -Continue baricitinib 8/31>>  -Continue Solu-Medrol 8/29-9/7.  On prednisone taper -Oxygen, inhalers, mucolytic/antitussive, vit C, Zinc, IS, prone while awake and PT/OT to mobilize -Continue monitoring inflammatory markers -Subcu Lovenox for VTE prophylaxis.  Hypokalemia-resolved -Continue to monitor  Mild transaminitis likely related to COVID-19.  Resolved.  Mild hyponatremia: Resolved. -Continue monitoring.  GERD -Continue PPI  Dyslipidemia -Continue atorvastatin  Hypertension/sinus tachycardia: Normotensive.  Tachycardia resolved. -Continue metoprolol 25  mg twice daily  Body mass index is 23.33 kg/m.         DVT prophylaxis:  enoxaparin (LOVENOX) injection 40 mg Start: 07/11/20 2200  Code Status: Full code Family Communication: Updated wife over the phone on 9/8. Status is: Inpatient  Remains inpatient appropriate because:Inpatient level of care appropriate due to severity of illness and Respiratory failure with significant oxygen  requirement, 10 L by HFNC to maintain appropriate saturation   Dispo: The patient is from: Home              Anticipated d/c is to: Home              Anticipated d/c date is: > 3 days              Patient currently is not medically stable to d/c.   Consultants:  PCCM   Sch Meds:  Scheduled Meds: . vitamin C  500 mg Oral Daily  . atorvastatin  20 mg Oral Daily  . baricitinib  4 mg Oral Daily  . chlorhexidine  15 mL Mouth Rinse BID  . enoxaparin (LOVENOX) injection  40 mg Subcutaneous QHS  . fluticasone  1 spray Each Nare Daily  . loratadine  10 mg Oral Daily  . mouth rinse  15 mL Mouth Rinse q12n4p  . metoprolol tartrate  25 mg Oral BID  . pantoprazole  40 mg Oral Daily  . predniSONE  40 mg Oral Q breakfast   Followed by  . [START ON 07/24/2020] predniSONE  30 mg Oral Q breakfast   Followed by  . [START ON 07/27/2020] predniSONE  20 mg Oral Q breakfast   Followed by  . [START ON 07/30/2020] predniSONE  10 mg Oral Q breakfast   Followed by  . [START ON 08/02/2020] predniSONE  5 mg Oral Q breakfast  . zinc sulfate  220 mg Oral Daily   Continuous Infusions:  PRN Meds:.acetaminophen **OR** acetaminophen, albuterol, alum & mag hydroxide-simeth, chlorpheniramine-HYDROcodone, cyclobenzaprine, guaiFENesin-dextromethorphan, LORazepam, Muscle Rub, ondansetron **OR** ondansetron (ZOFRAN) IV, oxyCODONE, sodium chloride  Antimicrobials: Anti-infectives (From admission, onward)   Start     Dose/Rate Route Frequency Ordered Stop   07/12/20 1000  remdesivir 100 mg in sodium chloride 0.9 % 100 mL IVPB       "Followed by" Linked Group Details   100 mg 200 mL/hr over 30 Minutes Intravenous Daily 07/11/20 0028 07/15/20 0922   07/11/20 1000  remdesivir 100 mg in sodium chloride 0.9 % 100 mL IVPB  Status:  Discontinued       "Followed by" Linked Group Details   100 mg 200 mL/hr over 30 Minutes Intravenous Daily 07/10/20 2338 07/11/20 0027   07/11/20 0030  remdesivir 100 mg in sodium chloride  0.9 % 100 mL IVPB       "Followed by" Linked Group Details   100 mg 200 mL/hr over 30 Minutes Intravenous Every 30 min 07/11/20 0028 07/11/20 0210   07/10/20 2345  remdesivir 200 mg in sodium chloride 0.9% 250 mL IVPB  Status:  Discontinued       "Followed by" Linked Group Details   200 mg 580 mL/hr over 30 Minutes Intravenous Once 07/10/20 2338 07/11/20 0027       I have personally reviewed the following labs and images: CBC: Recent Labs  Lab 07/17/20 0242  WBC 18.4*  NEUTROABS 16.9*  HGB 15.2  HCT 44.8  MCV 87.0  PLT 665*   BMP &GFR Recent Labs  Lab 07/17/20 0242  NA  137  K 4.6  CL 98  CO2 27  GLUCOSE 153*  BUN 26*  CREATININE 0.73  CALCIUM 9.0   Estimated Creatinine Clearance: 128.9 mL/min (by C-G formula based on SCr of 0.73 mg/dL). Liver & Pancreas: Recent Labs  Lab 07/17/20 0242  AST 19  ALT 33  ALKPHOS 58  BILITOT 1.5*  PROT 7.2  ALBUMIN 3.5   No results for input(s): LIPASE, AMYLASE in the last 168 hours. No results for input(s): AMMONIA in the last 168 hours. Diabetic: No results for input(s): HGBA1C in the last 72 hours. No results for input(s): GLUCAP in the last 168 hours. Cardiac Enzymes: No results for input(s): CKTOTAL, CKMB, CKMBINDEX, TROPONINI in the last 168 hours. No results for input(s): PROBNP in the last 8760 hours. Coagulation Profile: No results for input(s): INR, PROTIME in the last 168 hours. Thyroid Function Tests: No results for input(s): TSH, T4TOTAL, FREET4, T3FREE, THYROIDAB in the last 72 hours. Lipid Profile: No results for input(s): CHOL, HDL, LDLCALC, TRIG, CHOLHDL, LDLDIRECT in the last 72 hours. Anemia Panel: Recent Labs    07/21/20 0410 07/22/20 0359  FERRITIN 846* 803*   Urine analysis: No results found for: COLORURINE, APPEARANCEUR, LABSPEC, PHURINE, GLUCOSEU, HGBUR, BILIRUBINUR, KETONESUR, PROTEINUR, UROBILINOGEN, NITRITE, LEUKOCYTESUR Sepsis Labs: Invalid input(s): PROCALCITONIN,  [place]  Microbiology: Recent Results (from the past 240 hour(s))  Culture, blood (routine x 2)     Status: None   Collection Time: 07/13/20  7:21 PM   Specimen: Right Antecubital; Blood  Result Value Ref Range Status   Specimen Description RIGHT ANTECUBITAL  Final   Special Requests   Final    BOTTLES DRAWN AEROBIC AND ANAEROBIC Blood Culture adequate volume   Culture   Final    NO GROWTH 5 DAYS Performed at [facility], [address], [place], [place] [address]    Report Status 07/18/2020 FINAL  Final  Culture, blood (routine x 2)     Status: None   Collection Time: 07/13/20  7:21 PM   Specimen: Right Antecubital; Blood  Result Value Ref Range Status   Specimen Description RIGHT ANTECUBITAL  Final   Special Requests   Final    BOTTLES DRAWN AEROBIC AND ANAEROBIC Blood Culture adequate volume   Culture   Final    NO GROWTH 5 DAYS Performed at [facility], [address] [place], [place] [address]    Report Status 07/18/2020 FINAL  Final  MRSA PCR Screening     Status: None   Collection Time: 07/14/20 10:06 AM   Specimen: Nasal Mucosa; Nasopharyngeal  Result Value Ref Range Status   MRSA by PCR NEGATIVE NEGATIVE Final    Comment:        The GeneXpert MRSA Assay (FDA approved for NASAL specimens only), is one component of a comprehensive MRSA colonization surveillance program. It is not intended to diagnose MRSA infection nor to guide or monitor treatment for MRSA infections. Performed at [facility], [place] [address] [place], [place] [address]     Radiology Studies: No results found.  Amaziah Ghosh T. [place]  If 7PM-7AM, please contact night-coverage [email or web address] 07/22/2020, 2:33 PM

## 2020-07-22 NOTE — Progress Notes (Signed)
Physical Therapy Treatment Patient Details Name: Kent Hernandez MRN: [id number] DOB: Apr 29, 1986 Today's Date: 07/22/2020    History of Present Illness 34 yo male admitted with COVID.    PT Comments    The patient is eager to ambulate, Ambulated x 60' x 2 on 8 L HFNC, finger sensor reading erratic at lowest 85%. At rest returns to 92%. HR 114. Continue encouragement of  Exercises at bedside and use of IS. Continue PT.   Follow Up Recommendations  Home health PT (may not require and  may not have ins. to cover)     Equipment Recommendations  None recommended by PT    Recommendations for Other Services       Precautions / Restrictions Precautions Precaution Comments: monitor O2    Mobility  Bed Mobility Overal bed mobility: Independent                Transfers Overall transfer level: Needs assistance   Transfers: Sit to/from Stand Sit to Stand: Independent            Ambulation/Gait Ambulation/Gait assistance: Min guard;Supervision Gait Distance (Feet): 60 Feet (x2) Assistive device: None Gait Pattern/deviations: Step-through pattern;Decreased stride length     General Gait Details: no LOB noted   Stairs             Wheelchair Mobility    Modified Rankin (Stroke Patients Only)       Balance                                            Cognition Arousal/Alertness: Awake/alert Behavior During Therapy: Anxious                                   General Comments: urgently needs to use BR and  sates noone has come to move bed rail.      Exercises      General Comments        Pertinent Vitals/Pain Pain Assessment: No/denies pain    Home Living                      Prior Function            PT Goals (current goals can now be found in the care plan section) Progress towards PT goals: Progressing toward goals    Frequency    Min 3X/week      PT Plan Current plan remains  appropriate    Co-evaluation              AM-PAC PT "6 Clicks" Mobility   Outcome Measure  Help needed turning from your back to your side while in a flat bed without using bedrails?: None Help needed moving from lying on your back to sitting on the side of a flat bed without using bedrails?: None Help needed moving to and from a bed to a chair (including a wheelchair)?: None Help needed standing up from a chair using your arms (e.g., wheelchair or bedside chair)?: None Help needed to walk in hospital room?: A Little Help needed climbing 3-5 steps with a railing? : A Little 6 Click Score: 22    End of Session Equipment Utilized During Treatment: Oxygen Activity Tolerance: Patient tolerated treatment well Patient left: in chair;with call bell/phone within reach Nurse  Communication: Mobility status PT Visit Diagnosis: Unsteadiness on feet (R26.81);Difficulty in walking, not elsewhere classified (R26.2)     Time: 8088-1103 PT Time Calculation (min) (ACUTE ONLY): 10 min  Charges:  $Gait Training: 8-22 mins                     [place] Pager [phone] Office [phone]    Claretha Cooper 07/22/2020, 1:02 PM

## 2020-07-22 NOTE — Plan of Care (Signed)
  Problem: Education: Goal: Knowledge of risk factors and measures for prevention of condition will improve Outcome: Progressing   Problem: Coping: Goal: Psychosocial and spiritual needs will be supported Outcome: Progressing   Problem: Respiratory: Goal: Will maintain a patent airway Outcome: Progressing Goal: Complications related to the disease process, condition or treatment will be avoided or minimized Outcome: Progressing   Problem: Education: Goal: Knowledge of risk factors and measures for prevention of condition will improve Outcome: Progressing   Problem: Coping: Goal: Psychosocial and spiritual needs will be supported Outcome: Progressing   Problem: Respiratory: Goal: Will maintain a patent airway Outcome: Progressing Goal: Complications related to the disease process, condition or treatment will be avoided or minimized Outcome: Progressing   Problem: Education: Goal: Knowledge of General Education information will improve Description: Including pain rating scale, medication(s)/side effects and non-pharmacologic comfort measures Outcome: Progressing   Problem: Health Behavior/Discharge Planning: Goal: Ability to manage health-related needs will improve Outcome: Progressing   Problem: Clinical Measurements: Goal: Ability to maintain clinical measurements within normal limits will improve Outcome: Progressing Goal: Will remain free from infection Outcome: Progressing Goal: Diagnostic test results will improve Outcome: Progressing Goal: Respiratory complications will improve Outcome: Progressing Goal: Cardiovascular complication will be avoided Outcome: Progressing   Problem: Activity: Goal: Risk for activity intolerance will decrease Outcome: Progressing   Problem: Nutrition: Goal: Adequate nutrition will be maintained Outcome: Progressing   Problem: Coping: Goal: Level of anxiety will decrease Outcome: Progressing   Problem: Elimination: Goal:  Will not experience complications related to bowel motility Outcome: Progressing Goal: Will not experience complications related to urinary retention Outcome: Progressing   Problem: Pain Managment: Goal: General experience of comfort will improve Outcome: Progressing   Problem: Safety: Goal: Ability to remain free from injury will improve Outcome: Progressing   Problem: Skin Integrity: Goal: Risk for impaired skin integrity will decrease Outcome: Progressing   

## 2020-07-23 LAB — D-DIMER, QUANTITATIVE: D-Dimer, Quant: 0.9 ug/mL-FEU — ABNORMAL HIGH (ref 0.00–0.50)

## 2020-07-23 LAB — C-REACTIVE PROTEIN: CRP: 0.7 mg/dL (ref <1.0)

## 2020-07-23 LAB — FERRITIN: Ferritin: 777 ng/mL — ABNORMAL HIGH (ref 24–336)

## 2020-07-23 NOTE — Progress Notes (Signed)
Rounding MD order to wean pt as tolerated.   Pt made aware of MD order and okay with weaning process. Started on 8L HFNC, then changed to 6L and ambulated in room. States able to breathe easy; sats did drop to upper 80's but easily recovered with rest.   Offered pt one more decrease as he's been maintaining mid to upper 90's oxygen saturation. Decrease to 4L and instructed to continue with IS and flutter valve. Also instructed to verbalize increase oxygen needs to adjust accordingly.   Pt currently on 4L and verbalizes "feeling good" and aware when he dropped to upper 80's he'll do more IS and flutter valve exercises.

## 2020-07-23 NOTE — Progress Notes (Signed)
Physical Therapy  SATURATION QUALIFICATIONS: (This note is used to comply with regulatory documentation for home oxygen)  Patient Saturations on Room Air at Rest = 85%  Patient Saturations on Room Air while Ambulating >50 feet around room approx 7 min walk = 79% min dyspnea  Patient Saturations on 6 Liters of oxygen while Ambulating = 89%  Please briefly explain why patient needs home oxygen:   pt minimal oxygen requirements is 4 lts at rest and 6 lts with activity to achieve sats at therapeutic levels  Pt hopes to D/C to home before or on [birth year].    Rica Koyanagi  PTA Acute  Rehabilitation Services Pager      [phone] Office      [phone]

## 2020-07-23 NOTE — Progress Notes (Signed)
Physical Therapy Treatment Patient Details Name: Kent Hernandez MRN: [id number] DOB: Jan 31, 1986 Today's Date: 07/23/2020    History of Present Illness 34 yo male admitted with COVID.    PT Comments    Pt feeling better and doing everything he can to go home before his birthday.  Pt is moving about the room, has set an alarm on his phone to alert him time to exefcise, he is sleeping Prone and he is faithfully using his flutter and spirometer.  Trial oxygen wean: Patient Saturations on Room Air at Rest = 85%  Patient Saturations on Room Air while Ambulating >50 feet around room approx 7 min walk = 79% min dyspnea  Patient Saturations on 6 Liters of oxygen while Ambulating = 89%  Please briefly explain why patient needs home oxygen:   pt minimal oxygen requirements is 4 lts at rest and 6 lts with activity to achieve sats at therapeutic levels   Follow Up Recommendations  Home health PT (may not need spouse is a Clinical research associate)     Equipment Recommendations  None recommended by PT    Recommendations for Other Services       Precautions / Restrictions Precautions Precautions: Fall Precaution Comments: monitor O2    Mobility  Bed Mobility Overal bed mobility: Modified Independent                Transfers Overall transfer level: Modified independent                  Ambulation/Gait Ambulation/Gait assistance: Modified independent (Device/Increase time) Gait Distance (Feet): 185 Feet Assistive device: None Gait Pattern/deviations: Step-through pattern;Decreased stride length     General Gait Details: several laps around the room with walking time > 10 min while monitoring sats and HR.  "I feel great"  very mild dyspnea with exersion   Stairs             Wheelchair Mobility    Modified Rankin (Stroke Patients Only)       Balance                                            Cognition Arousal/Alertness:  Awake/alert Behavior During Therapy: WFL for tasks assessed/performed Overall Cognitive Status: Within Functional Limits for tasks assessed                                 General Comments: very motivated and doing everthing he can to get better and hopes to go home before his Birthday      Exercises      General Comments        Pertinent Vitals/Pain Pain Assessment: No/denies pain    Home Living                      Prior Function            PT Goals (current goals can now be found in the care plan section) Progress towards PT goals: Progressing toward goals    Frequency    Min 3X/week      PT Plan Current plan remains appropriate    Co-evaluation              AM-PAC PT "6 Clicks" Mobility   Outcome Measure  Help needed turning from your back  to your side while in a flat bed without using bedrails?: None Help needed moving from lying on your back to sitting on the side of a flat bed without using bedrails?: None Help needed moving to and from a bed to a chair (including a wheelchair)?: None Help needed standing up from a chair using your arms (e.g., wheelchair or bedside chair)?: None Help needed to walk in hospital room?: None Help needed climbing 3-5 steps with a railing? : A Little 6 Click Score: 23    End of Session Equipment Utilized During Treatment: Oxygen;Gait belt Activity Tolerance: Patient tolerated treatment well Patient left: in chair;with call bell/phone within reach Nurse Communication: Mobility status PT Visit Diagnosis: Unsteadiness on feet (R26.81);Difficulty in walking, not elsewhere classified (R26.2)     Time: 4136-4383 PT Time Calculation (min) (ACUTE ONLY): 33 min  Charges:  $Gait Training: 8-22 mins $Therapeutic Activity: 8-22 mins                     Rica Koyanagi  PTA Acute  Rehabilitation Services Pager      [phone] Office      [phone]

## 2020-07-23 NOTE — Progress Notes (Signed)
PROGRESS NOTE  Kent Hernandez HGD:[id number] DOB: Feb 12, 1986   PCP: Susy Frizzle, MD  Patient is from: Home  DOA: 07/10/2020 LOS: 27  Brief Narrative / Interim history: 34 year old male with history of cervical DDD, HTN, GERD, H. pylori infection and allergic rhinitis presenting with COVID-19 symptoms.  He tested positive on 07/01/2020 at [facility].  He was exposed to his wife who had URI symptoms before him.  In ED, he was hypoxic requiring 4 L.  Inflammatory markers elevated.  CTA chest negative for PE but bilateral groundglass opacities.  Patient was started on steroid, remdesivir and admitted.   Patient had increased oxygen requirement.  Baricitinib added on 07/13/2020.  He was transferred to [facility] as there was not SDU or ICU bed at [facility].  On arrival here, sitting in upper 80s to lower 90s 35 L / 100% HFNC and NRB.  Solu-Medrol dose increased.  PCCM consulted.  Patient is now down to 6L by HFNC.   Subjective: Seen and examined earlier this morning.  No major events overnight of this morning.  No complaints.  He denies chest pain, shortness of breath, cough, nausea, vomiting or UTI symptoms.  He was down to 8 L by HFNC saturating in mid 90s this morning.  He is now down to 6 L saturating in upper 80s to mid 90s.  Objective: Vitals:   07/22/20 1954 07/22/20 2147 07/23/20 0358 07/23/20 1435  BP: 122/88  103/73 127/79  Pulse: 95  66 85  Resp: 20  18   Temp: 97.8 F (36.6 C)  98.1 F (36.7 C) (!) 97.4 F (36.3 C)  TempSrc: Oral   Oral  SpO2: 96% 94% 97% 95%  Weight:      Height:        Intake/Output Summary (Last 24 hours) at 07/23/2020 1452 Last data filed at 07/23/2020 1436 Gross per 24 hour  Intake 480 ml  Output 2000 ml  Net -1520 ml   Filed Weights   07/10/20 2123  Weight: 71.7 kg    Examination:  GENERAL: No apparent distress.  Nontoxic. HEENT: MMM.  Vision and hearing grossly intact.  NECK: Supple.  No apparent JVD.  RESP: Upper 80s  to mid 90s on 6 L.  No IWOB.  Fair aeration bilaterally. CVS:  RRR. Heart sounds normal.  ABD/GI/GU: BS+. Abd soft, NTND.  MSK/EXT:  Moves extremities. No apparent deformity. No edema.  SKIN: no apparent skin lesion or wound NEURO: Awake, alert and oriented appropriately.  No apparent focal neuro deficit. PSYCH: Calm. Normal affect.   Procedures:  None  Microbiology summarized: COVID-19 PCR positive Blood cultures negative.  Assessment & Plan: Acute respiratory failure with hypoxia due to COVID-19 pneumonia: Tested positive on 07/01/2020.  Increased inflammatory markers.  CTA chest negative for PE but groundglass opacities.  Procalcitonin negative. Inflammatory markers downtrended.  Now down to 6 L by HFNC.  Recent Labs    07/21/20 0410 07/22/20 0359 07/23/20 0407  DDIMER 1.00* 0.92* 0.90*  FERRITIN 846* 803* 777*  CRP 0.9 1.0* 0.7  -Remdesivir 8/29-9/2 -Continue baricitinib 8/31>>  -Continue Solu-Medrol 8/29-9/7.  On prednisone taper -Oxygen, inhalers, mucolytic/antitussive, vit C, Zinc, IS, prone while awake and PT/OT to mobilize -Continue monitoring inflammatory markers -Subcu Lovenox for VTE prophylaxis.  Hypokalemia-resolved -Continue to monitor  Mild transaminitis likely related to COVID-19.  Resolved.  Mild hyponatremia: Resolved. -Continue monitoring.  GERD -Continue PPI  Dyslipidemia -Continue atorvastatin  Hypertension/sinus tachycardia: Normotensive.  Tachycardia resolved. -Continue metoprolol 25  mg twice daily  Body mass index is 23.33 kg/m.         DVT prophylaxis:  enoxaparin (LOVENOX) injection 40 mg Start: 07/11/20 2200  Code Status: Full code Family Communication: Updated wife over the phone Status is: Inpatient  Remains inpatient appropriate because:Inpatient level of care appropriate due to severity of illness and Respiratory failure with significant oxygen requirement, 6 L by HFNC to maintain appropriate saturation   Dispo: The  patient is from: Home              Anticipated d/c is to: Home              Anticipated d/c date is: 1 day              Patient currently is not medically stable to d/c.   Consultants:  PCCM   Sch Meds:  Scheduled Meds:  vitamin C  500 mg Oral Daily   atorvastatin  20 mg Oral Daily   baricitinib  4 mg Oral Daily   chlorhexidine  15 mL Mouth Rinse BID   enoxaparin (LOVENOX) injection  40 mg Subcutaneous QHS   fluticasone  1 spray Each Nare Daily   loratadine  10 mg Oral Daily   mouth rinse  15 mL Mouth Rinse q12n4p   metoprolol tartrate  25 mg Oral BID   pantoprazole  40 mg Oral Daily   [START ON 07/24/2020] predniSONE  30 mg Oral Q breakfast   Followed by   Derrill Memo ON 07/27/2020] predniSONE  20 mg Oral Q breakfast   Followed by   Derrill Memo ON 07/30/2020] predniSONE  10 mg Oral Q breakfast   Followed by   Derrill Memo ON 08/02/2020] predniSONE  5 mg Oral Q breakfast   zinc sulfate  220 mg Oral Daily   Continuous Infusions:  PRN Meds:.acetaminophen **OR** acetaminophen, albuterol, alum & mag hydroxide-simeth, chlorpheniramine-HYDROcodone, cyclobenzaprine, guaiFENesin-dextromethorphan, LORazepam, Muscle Rub, ondansetron **OR** ondansetron (ZOFRAN) IV, oxyCODONE, sodium chloride  Antimicrobials: Anti-infectives (From admission, onward)   Start     Dose/Rate Route Frequency Ordered Stop   07/12/20 1000  remdesivir 100 mg in sodium chloride 0.9 % 100 mL IVPB       "Followed by" Linked Group Details   100 mg 200 mL/hr over 30 Minutes Intravenous Daily 07/11/20 0028 07/15/20 0922   07/11/20 1000  remdesivir 100 mg in sodium chloride 0.9 % 100 mL IVPB  Status:  Discontinued       "Followed by" Linked Group Details   100 mg 200 mL/hr over 30 Minutes Intravenous Daily 07/10/20 2338 07/11/20 0027   07/11/20 0030  remdesivir 100 mg in sodium chloride 0.9 % 100 mL IVPB       "Followed by" Linked Group Details   100 mg 200 mL/hr over 30 Minutes Intravenous Every 30 min 07/11/20  0028 07/11/20 0210   07/10/20 2345  remdesivir 200 mg in sodium chloride 0.9% 250 mL IVPB  Status:  Discontinued       "Followed by" Linked Group Details   200 mg 580 mL/hr over 30 Minutes Intravenous Once 07/10/20 2338 07/11/20 0027       I have personally reviewed the following labs and images: CBC: Recent Labs  Lab 07/17/20 0242  WBC 18.4*  NEUTROABS 16.9*  HGB 15.2  HCT 44.8  MCV 87.0  PLT 665*   BMP &GFR Recent Labs  Lab 07/17/20 0242  NA 137  K 4.6  CL 98  CO2 27  GLUCOSE 153*  BUN 26*  CREATININE 0.73  CALCIUM 9.0   Estimated Creatinine Clearance: 128.9 mL/min (by C-G formula based on SCr of 0.73 mg/dL). Liver & Pancreas: Recent Labs  Lab 07/17/20 0242  AST 19  ALT 33  ALKPHOS 58  BILITOT 1.5*  PROT 7.2  ALBUMIN 3.5   No results for input(s): LIPASE, AMYLASE in the last 168 hours. No results for input(s): AMMONIA in the last 168 hours. Diabetic: No results for input(s): HGBA1C in the last 72 hours. No results for input(s): GLUCAP in the last 168 hours. Cardiac Enzymes: No results for input(s): CKTOTAL, CKMB, CKMBINDEX, TROPONINI in the last 168 hours. No results for input(s): PROBNP in the last 8760 hours. Coagulation Profile: No results for input(s): INR, PROTIME in the last 168 hours. Thyroid Function Tests: No results for input(s): TSH, T4TOTAL, FREET4, T3FREE, THYROIDAB in the last 72 hours. Lipid Profile: No results for input(s): CHOL, HDL, LDLCALC, TRIG, CHOLHDL, LDLDIRECT in the last 72 hours. Anemia Panel: Recent Labs    07/22/20 0359 07/23/20 0407  FERRITIN 803* 777*   Urine analysis: No results found for: COLORURINE, APPEARANCEUR, LABSPEC, PHURINE, GLUCOSEU, HGBUR, BILIRUBINUR, KETONESUR, PROTEINUR, UROBILINOGEN, NITRITE, LEUKOCYTESUR Sepsis Labs: Invalid input(s): PROCALCITONIN, [place]  Microbiology: Recent Results (from the past 240 hour(s))  Culture, blood (routine x 2)     Status: None   Collection Time: 07/13/20   7:21 PM   Specimen: Right Antecubital; Blood  Result Value Ref Range Status   Specimen Description RIGHT ANTECUBITAL  Final   Special Requests   Final    BOTTLES DRAWN AEROBIC AND ANAEROBIC Blood Culture adequate volume   Culture   Final    NO GROWTH 5 DAYS Performed at [facility], [address] [place], [place] [address]    Report Status 07/18/2020 FINAL  Final  Culture, blood (routine x 2)     Status: None   Collection Time: 07/13/20  7:21 PM   Specimen: Right Antecubital; Blood  Result Value Ref Range Status   Specimen Description RIGHT ANTECUBITAL  Final   Special Requests   Final    BOTTLES DRAWN AEROBIC AND ANAEROBIC Blood Culture adequate volume   Culture   Final    NO GROWTH 5 DAYS Performed at [facility], [address] [place], [place] [address]    Report Status 07/18/2020 FINAL  Final  MRSA PCR Screening     Status: None   Collection Time: 07/14/20 10:06 AM   Specimen: Nasal Mucosa; Nasopharyngeal  Result Value Ref Range Status   MRSA by PCR NEGATIVE NEGATIVE Final    Comment:        The GeneXpert MRSA Assay (FDA approved for NASAL specimens only), is one component of a comprehensive MRSA colonization surveillance program. It is not intended to diagnose MRSA infection nor to guide or monitor treatment for MRSA infections. Performed at [facility], [place] [address], [place], [place] [address]     Radiology Studies: No results found.  Dawnn Nam T. [place]  If 7PM-7AM, please contact night-coverage [email or web address] 07/23/2020, 2:52 PM

## 2020-07-23 NOTE — Evaluation (Signed)
Occupational Therapy Evaluation Patient Details Name: MARCIA LEPERA MRN: [id number] DOB: 1986-02-24 Today's Date: 07/23/2020    History of Present Illness 34 yo male admitted with COVID.   Clinical Impression   An occupational therapy evaluation was completed on this 34 year old, right handed,  male with pertinent past medical history of cervical DDD, HTN, GERD, as well as PMH in chart. Patient is currently requiring no assistance with ADLs.  No further acute OT needs identified. All education has been completed and the patient has no further questions.  See below for any follow-up Occupational Therapy or equipment needs. OT is signing off. Thank you for this referral.    ?   Follow Up Recommendations  No OT follow up    Equipment Recommendations       Recommendations for Other Services       Precautions / Restrictions Precautions Precautions: Fall Precaution Comments: monitor O2 Restrictions Weight Bearing Restrictions: No      Mobility Bed Mobility Overal bed mobility: Modified Independent             General bed mobility comments: oob in recliner  Transfers Overall transfer level: Independent   Transfers: Sit to/from Stand Sit to Stand: Independent              Balance Overall balance assessment: Independent Sitting-balance support: No upper extremity supported Sitting balance-Leahy Scale: Good     Standing balance support: No upper extremity supported Standing balance-Leahy Scale: Good                 High Level Balance Comments: Tandem stance RT and LT with no loss of balance x20 seconds each.           ADL either performed or assessed with clinical judgement   ADL Overall ADL's : Independent                                       General ADL Comments: Except for desaturation of O2 on 8L down to 84% with light activity requiring verbal cues for PLB and rest breaks.     Vision   Vision Assessment?: No apparent  visual deficits     Perception     Praxis      Pertinent Vitals/Pain Pain Assessment: No/denies pain     Hand Dominance Right   Extremity/Trunk Assessment Upper Extremity Assessment Upper Extremity Assessment: Overall WFL for tasks assessed   Lower Extremity Assessment Lower Extremity Assessment: Defer to PT evaluation   Cervical / Trunk Assessment Cervical / Trunk Assessment: Normal   Communication Communication Communication: No difficulties   Cognition Arousal/Alertness: Awake/alert Behavior During Therapy: WFL for tasks assessed/performed Overall Cognitive Status: Within Functional Limits for tasks assessed                                 General Comments: very motivated and doing everthing he can to get better and hopes to go home before his Birthday   General Comments       Exercises     Shoulder Instructions      Home Living Family/patient expects to be discharged to:: Private residence Living Arrangements: Spouse/significant other Available Help at Discharge: Family Type of Home: House Home Access: Stairs to enter Technical brewer of Steps: 4 Entrance Stairs-Rails: Left Home Layout: One level  Bathroom Shower/Tub: Tub/shower unit;Walk-in shower   Bathroom Toilet: Standard     Home Equipment: None          Prior Functioning/Environment Level of Independence: Independent        Comments: Works full time as a Dance movement psychotherapist at [employer], lots of time on feet.        OT Problem List: Decreased activity tolerance      OT Treatment/Interventions:      OT Goals(Current goals can be found in the care plan section) Acute Rehab OT Goals Patient Stated Goal: Go home in time for birthday. OT Goal Formulation: With patient Time For Goal Achievement: 07/27/20 Potential to Achieve Goals: Good  OT Frequency:     Barriers to D/C:            Co-evaluation              AM-PAC OT "6 Clicks" Daily Activity      Outcome Measure Help from another person eating meals?: None Help from another person taking care of personal grooming?: None Help from another person toileting, which includes using toliet, bedpan, or urinal?: None Help from another person bathing (including washing, rinsing, drying)?: None Help from another person to put on and taking off regular upper body clothing?: None Help from another person to put on and taking off regular lower body clothing?: None 6 Click Score: 24   End of Session Equipment Utilized During Treatment: Oxygen Nurse Communication: Mobility status  Activity Tolerance: Patient tolerated treatment well (desats) Patient left: in chair;with nursing/sitter in room;with call bell/phone within reach                   Time: 1216-1229 OT Time Calculation (min): 13 min Charges:  OT General Charges $OT Visit: 1 Visit OT Evaluation $OT Eval Low Complexity: 1 Low  Stephenson Cichy, OT Acute Rehab Services Office: [phone] 07/23/2020   Julien Girt 07/23/2020, 1:05 PM

## 2020-07-24 DIAGNOSIS — E785 Hyperlipidemia, unspecified: Secondary | ICD-10-CM

## 2020-07-24 LAB — CREATININE, SERUM
Creatinine, Ser: 0.72 mg/dL (ref 0.61–1.24)
GFR calc Af Amer: >60 mL/min (ref >60)
GFR calc non Af Amer: >60 mL/min (ref >60)

## 2020-07-24 MED ORDER — PREDNISONE 10 MG PO TABS: ORAL_TABLET | ORAL | 0 refills | Status: AC

## 2020-07-24 MED ORDER — ALBUTEROL SULFATE HFA 108 (90 BASE) MCG/ACT IN AERS: 2.0000 | INHALATION_SPRAY | Freq: Four times a day (QID) | RESPIRATORY_TRACT | 0 refills | Status: DC | PRN

## 2020-07-24 MED ORDER — GUAIFENESIN-DM 100-10 MG/5ML PO SYRP: 10.0000 mL | ORAL_SOLUTION | ORAL | 0 refills | Status: DC | PRN

## 2020-07-24 MED ORDER — METOPROLOL TARTRATE 25 MG PO TABS: 12.5000 mg | ORAL_TABLET | Freq: Two times a day (BID) | ORAL | 0 refills | Status: DC

## 2020-07-24 MED ORDER — ASCORBIC ACID 500 MG PO TABS: 500.0000 mg | ORAL_TABLET | Freq: Every day | ORAL | Status: AC

## 2020-07-24 MED ORDER — ZINC SULFATE 220 (50 ZN) MG PO CAPS: 220.0000 mg | ORAL_CAPSULE | Freq: Every day | ORAL | 0 refills | Status: DC

## 2020-07-24 NOTE — Discharge Summary (Signed)
Physician Discharge Summary  EYAL GREENHAW JJH:[id number] DOB: [birth year] DOA: 07/10/2020  PCP: Susy Frizzle, MD  Admit date: 07/10/2020 Discharge date: 07/24/2020  Admitted From: Home Disposition: Home  Recommendations for Outpatient Follow-up:  1. Follow ups as below. 2. Please obtain CBC/BMP/Mag at follow up 3. Please follow up on the following pending results: None  Home Health: None Equipment/Devices: Home oxygen  Discharge Condition: Stable CODE STATUS: Full code  Hospital Course: 34 year old male with history of cervical DDD, HTN, GERD, H. pylori infection and allergic rhinitis presenting with COVID-19 symptoms.  He tested positive on 07/01/2020 at [facility].  He was exposed to his wife who had URI symptoms before him.  In ED, he was hypoxic requiring 4 L.  Inflammatory markers elevated.  CTA chest negative for PE but bilateral groundglass opacities.  Patient was started on steroid, remdesivir and admitted.   Patient had increased oxygen requirement.  Baricitinib added on 07/13/2020.  He was transferred to [facility] as there was not SDU or ICU bed at [facility].  On arrival here, sitting in upper 80s to lower 90s on 40 L / 100% HFNC and NRB.  Solu-Medrol dose increased.  PCCM consulted.   Patient gradually improved.  Eventually weaned down to 4 L by [place].  However, he desaturated 71% with ambulation on room air although he was supposed to be ambulated on oxygen to begin with.  He required up to 6 L to recover to 91%.  However, patient had no dyspnea or respiratory distress.  He felt well and ready to go home.  So he was discharged on 5 L by [place].  He was encouraged to use minimum oxygen to keep his saturation above 90%.  Both patient and patient's wife are educated and comfortable managing his oxygen as instructed.  Return precautions and isolation precautions discussed.   See individual problem list below for more hospital course.  Discharge Diagnoses:    Acute respiratory failure with hypoxia due to COVID-19 pneumonia: Unvaccinated.  Symptom onset on 06/30/2020.  Tested positive on 07/01/2020.  CTA chest negative for PE but groundglass opacities.  Procalcitonin negative. Inflammatory markers elevated but down trended.   Required up to 40 L by HFNC/NRB and eventually weaned to 4 L.  Discharged on 5 L by [place]. Recent Labs    07/22/20 0359 07/23/20 0407  DDIMER 0.92* 0.90*  FERRITIN 803* 777*  CRP 1.0* 0.7  -Remdesivir 8/29-9/2.  Baricitinib 8/31-9/11.  Solu-Medrol 8/29-9/7.  -Discharged on short prednisone taper, as needed albuterol, vitamin C, zinc, mucolytic/antitussive. -Recommended activity as tolerated given risk for VTE -Return precautions and isolation precautions discussed.  Hypokalemia-resolved -Continue tomonitor  Mild transaminitis likely related to COVID-19.  Resolved.  Mild hyponatremia: Resolved. -Continue monitoring.  GERD -Continue PPI  Dyslipidemia -Continue atorvastatin  Hypertension/sinus tachycardia: Normotensive.  Tachycardia resolved. -Discharged on metoprolol 12.5 mg twice daily    Body mass index is 23.33 kg/m.            Discharge Exam: Vitals:   07/24/20 0400 07/24/20 0725  BP: 102/84   Pulse: 65   Resp: 15 16  Temp: 97.9 F (36.6 C)   SpO2: 98%     GENERAL: No apparent distress.  Nontoxic. HEENT: MMM.  Vision and hearing grossly intact.  NECK: Supple.  No apparent JVD.  RESP: 94% on 4 L at rest.  No IWOB.  Good air movement. CVS:  RRR. Heart sounds normal.  ABD/GI/GU: Bowel sounds present. Soft. Non tender.  MSK/EXT:  Moves extremities. No apparent deformity. No edema.  SKIN: no apparent skin lesion or wound NEURO: Awake, alert and oriented appropriately.  No apparent focal neuro deficit. PSYCH: Calm. Normal affect.  Discharge Instructions  Discharge Instructions    Call MD for:  difficulty breathing, headache or visual disturbances   Complete by: As directed    Call MD  for:  extreme fatigue   Complete by: As directed    Call MD for:  persistant dizziness or light-headedness   Complete by: As directed    Diet general   Complete by: As directed    Discharge instructions   Complete by: As directed    It has been a pleasure taking care of you!  You were hospitalized and treated for COVID-19 infection.  Your symptoms improved to the point we think it is safe to let you go home.  However, you still requires oxygen to maintain appropriate oxygen level (oxygen level above 90%) until your lung heals from  inflammation due to COVID-19.  Please follow-up with your primary care doctor in 1 to 2 weeks.    In regards to Covid infection, you are still potentially infectious for the next about 7 days. We recommend you isolate yourself and take the necessary precautions to prevent the virus from spreading.  Some of the steps to prevent the virus from spreading to others: Stay away from other and members of your household at least for about 7 days. Let healthy household members care for children and pets, if possible. If you have to care for children or pets, wash your hands often and wear a mask. If possible, stay in your own room, separate from others. Use a different bathroom.Make sure that all people in your household wash their hands well and often. Leave your house only to seek medical care. Do not use public transport. Do not travel while you are sick. Wash your hands often with soap and water for 20 seconds. If soap and water are not available, use alcohol-based hand sanitizer. Cough or sneeze into a tissue or your sleeve or elbow. Do not cough or sneeze into your hand or into the air. Wear a cloth face covering or face mask.  Return precautions: Get help or return to the hospital right away if: You have trouble breathing. You have pain or pressure in your chest. You have confusion. You have bluish lips and fingernails. You have difficulty waking from  sleep. You have symptoms that get worse. These symptoms may represent a serious problem that is an emergency. Do not wait to see if the symptoms will go away. Get medical help right away. Call your local emergency services (911 in the [place] Do not drive yourself to the hospital. Let the emergency medical personnel know if you think you have COVID-19.  To protect yourself in the future:  Do not travel to areas where COVID-19 is a risk. The areas where COVID-19 is reported change often. To identify high-risk areas and travel restrictions, check the CDC travel website: FatFares.com.br If you live in, or must travel to, an area where COVID-19 is a risk, take precautions to avoid infection. Stay away from people who are sick. Wash your hands often with soap and water for 20 seconds. If soap and water are not available, use an alcohol-based hand sanitizer. Avoid touching your mouth, face, eyes, or nose. Avoid going out in public, follow guidance from your state and local health authorities. If you must go out in public,  wear a cloth face covering or face mask. Disinfect objects and surfaces that are frequently touched every day. This may include: Counters and tables. Doorknobs and light switches. Sinks and faucets. Electronics, such as phones, remote controls, keyboards, computers, and tablets.    Where to find more information Centers for Disease Control and Prevention: PurpleGadgets.be World Health Organization: https://www.castaneda.info/   Increase activity slowly   Complete by: As directed      Allergies as of 07/24/2020   No Known Allergies     Medication List    STOP taking these medications   azithromycin 250 MG tablet Commonly known as: Zithromax Z-Pak     TAKE these medications   albuterol 108 (90 Base) MCG/ACT inhaler Commonly known as: VENTOLIN HFA Inhale 2 puffs into the lungs every 6 (six) hours as needed for wheezing  or shortness of breath.   ascorbic acid 500 MG tablet Commonly known as: VITAMIN C Take 1 tablet (500 mg total) by mouth daily.   atorvastatin 20 MG tablet Commonly known as: LIPITOR Take 1 tablet (20 mg total) by mouth daily.   cetirizine 10 MG tablet Commonly known as: ZYRTEC Take 10 mg by mouth daily.   fluticasone 50 MCG/ACT nasal spray Commonly known as: FLONASE Place 1 spray into both nostrils daily.   guaiFENesin-dextromethorphan 100-10 MG/5ML syrup Commonly known as: ROBITUSSIN DM Take 10 mLs by mouth every 4 (four) hours as needed for cough.   metoprolol tartrate 25 MG tablet Commonly known as: LOPRESSOR Take 0.5 tablets (12.5 mg total) by mouth 2 (two) times daily.   predniSONE 10 MG tablet Commonly known as: DELTASONE Take 3 tablets (30 mg total) by mouth daily for 1 day, THEN 2 tablets (20 mg total) daily for 2 days, THEN 1 tablet (10 mg total) daily for 2 days, THEN 0.5 tablets (5 mg total) daily for 2 days. Start taking on: July 24, 2020   zinc sulfate 220 (50 Zn) MG capsule Take 1 capsule (220 mg total) by mouth daily.            Durable Medical Equipment  (From admission, onward)         Start     Ordered   07/24/20 0954  For home use only DME oxygen  Once       Question Answer Comment  Length of Need 6 Months   Liters per Minute 5   Frequency Continuous (stationary and portable oxygen unit needed)   Oxygen delivery system Gas      07/24/20 0953          Consultations:  PCCM  Procedures/Studies:   CT Angio Chest PE W and/or Wo Contrast  Result Date: 07/11/2020 CLINICAL DATA:  34 year old male with concern for pulmonary embolism. Positive COVID-19. EXAM: CT ANGIOGRAPHY CHEST WITH CONTRAST TECHNIQUE: Multidetector CT imaging of the chest was performed using the standard protocol during bolus administration of intravenous contrast. Multiplanar CT image reconstructions and MIPs were obtained to evaluate the vascular anatomy.  CONTRAST:  72mL OMNIPAQUE IOHEXOL 350 MG/ML SOLN COMPARISON:  Chest radiograph dated 07/07/2020. FINDINGS: Evaluation of this exam is limited due to respiratory motion artifact. Cardiovascular: There is no cardiomegaly or pericardial effusion. The thoracic aorta is unremarkable. Evaluation of the pulmonary arteries is very limited due to severe respiratory motion artifact. No large or central pulmonary artery embolus identified. Mediastinum/Nodes: Mildly enlarged right hilar lymph node measuring 13 mm in short axis. No mediastinal adenopathy. The esophagus and the thyroid gland are grossly unremarkable. No  mediastinal fluid collection. Lungs/Pleura: Bilateral patchy ground-glass opacities with peripheral and subpleural distribution in keeping with COVID pneumonia. There is no pleural effusion pneumothorax. The central airways are patent. Upper Abdomen: Indeterminate ill-defined 3.4 x 2.8 cm left liver hypodense lesion, likely a hemangioma. Further characterization with MRI without and with contrast on a nonemergent/outpatient basis recommended. Musculoskeletal: No chest wall abnormality. No acute or significant osseous findings. Review of the MIP images confirms the above findings. IMPRESSION: 1. No CT evidence of central pulmonary artery embolus. 2. Bilateral patchy ground-glass opacities in keeping with COVID pneumonia. Clinical correlation and follow-up to resolution recommended. 3. Mildly enlarged right hilar lymph node, likely reactive. 4. Indeterminate ill-defined left liver hypodense lesion, likely a hemangioma. Further characterization with MRI without and with contrast on a nonemergent/outpatient basis recommended. Electronically Signed   By: Anner Crete M.D.   On: 07/11/2020 00:38   DG Chest Port 1 View  Result Date: 07/13/2020 CLINICAL DATA:  Fever, COVID pneumonia EXAM: PORTABLE CHEST 1 VIEW COMPARISON:  07/07/2020 FINDINGS: The lungs are well expanded and are symmetric though pulmonary  insufflation has slightly diminished since prior examination. Extensive mid and lower lung zone airspace infiltrates are identified and have progressed since prior examination in keeping with changes of atypical infection. No pneumothorax or pleural effusion. Cardiac size within normal limits. Pulmonary vascularity is normal. No acute bone abnormality. IMPRESSION: Interval worsening of bilateral mid and lower lung zone airspace infiltrates in keeping with atypical infection. Electronically Signed   By: Fidela Salisbury MD   On: 07/13/2020 19:31   DG Chest Portable 1 View  Result Date: 07/07/2020 CLINICAL DATA:  Cough.  COVID positive EXAM: PORTABLE CHEST 1 VIEW COMPARISON:  04/11/2020 FINDINGS: Patchy bilateral pneumonia. No edema, effusion, or pneumothorax. Normal heart size IMPRESSION: COVID associated bilateral pneumonia. Electronically Signed   By: Monte Fantasia M.D.   On: 07/07/2020 10:33        The results of significant diagnostics from this hospitalization (including imaging, microbiology, ancillary and laboratory) are listed below for reference.     Microbiology: No results found for this or any previous visit (from the past 240 hour(s)).   Labs: BNP (last 3 results) No results for input(s): BNP in the last 8760 hours. Basic Metabolic Panel: Recent Labs  Lab 07/24/20 0413  CREATININE 0.72   Liver Function Tests: No results for input(s): AST, ALT, ALKPHOS, BILITOT, PROT, ALBUMIN in the last 168 hours. No results for input(s): LIPASE, AMYLASE in the last 168 hours. No results for input(s): AMMONIA in the last 168 hours. CBC: No results for input(s): WBC, NEUTROABS, HGB, HCT, MCV, PLT in the last 168 hours. Cardiac Enzymes: No results for input(s): CKTOTAL, CKMB, CKMBINDEX, TROPONINI in the last 168 hours. BNP: Invalid input(s): POCBNP CBG: No results for input(s): GLUCAP in the last 168 hours. D-Dimer Recent Labs    07/22/20 0359 07/23/20 0407  DDIMER 0.92* 0.90*    Hgb A1c No results for input(s): HGBA1C in the last 72 hours. Lipid Profile No results for input(s): CHOL, HDL, LDLCALC, TRIG, CHOLHDL, LDLDIRECT in the last 72 hours. Thyroid function studies No results for input(s): TSH, T4TOTAL, T3FREE, THYROIDAB in the last 72 hours.  Invalid input(s): FREET3 Anemia work up Recent Labs    07/22/20 0359 07/23/20 0407  FERRITIN 803* 777*   Urinalysis No results found for: COLORURINE, APPEARANCEUR, LABSPEC, PHURINE, GLUCOSEU, HGBUR, BILIRUBINUR, KETONESUR, PROTEINUR, UROBILINOGEN, NITRITE, LEUKOCYTESUR Sepsis Labs Invalid input(s): PROCALCITONIN,  WBC,  LACTICIDVEN   Time coordinating discharge: 61  minutes  SIGNED:  Mercy Riding, MD  [facility] 07/24/2020, 4:11 PM  If 7PM-7AM, please contact night-coverage [email or web address]

## 2020-07-24 NOTE — Progress Notes (Signed)
SATURATION QUALIFICATIONS: (This note is used to comply with regulatory documentation for home oxygen)  Patient Saturations on Room Air at Rest = 76%  Patient Saturations on Room Air while Ambulating = 71%  Patient Saturations on 6 Liters of oxygen while Ambulating = 91%  Please briefly explain why patient needs home oxygen: O2 sats drop significantly without oxygen. Even at rest without O2 is 84%. Will need O2 when exerting and resting.

## 2020-07-24 NOTE — TOC Transition Note (Signed)
Transition of Care [facility]) - CM/SW Discharge Note   Patient Details  Name: Kent Hernandez MRN: [id number] Date of Birth: 13-Aug-1986  [facility] CM/SW Contact:  Trish Mage, LCSW Phone Number: 07/24/2020, 11:38 AM   Clinical Narrative:   Patient scheduled to d/c today is in need of home O2, has ride home.  [name] confirms that his wife will be with him for the coming week, she is in the health care profession, and he feels like he does not need HH PT as recommended by our PT staff. I made sure he understands if he changes his mind he can go through his PCP to get that service set up.  In the meantime, I called [employer] to provide travel canisters and home concentrator set-up. SAT note and orders seen and appreciated. No further needs identifed. TOC sign off.    Final next level of care: Home/Self Care Barriers to Discharge: Barriers Resolved   Patient Goals and CMS Choice Patient states their goals for this hospitalization and ongoing recovery are:: to go home CMS Medicare.gov Compare Post Acute Care list provided to:: Patient Choice offered to / list presented to : Patient  Discharge Placement                       Discharge Plan and Services   Discharge Planning Services: CM Consult                                 Social Determinants of Health (SDOH) Interventions     Readmission Risk Interventions No flowsheet data found.

## 2020-07-24 NOTE — Progress Notes (Signed)
Pt leaving this afternoon with his spouse. Alert and oriented. Pt leaving with cell phone, clothes, IS and FV, home O2. Pt aware of followup with PCP.

## 2020-08-06 ENCOUNTER — Ambulatory Visit
Admission: RE | Admit: 2020-08-06 | Discharge: 2020-08-06 | Disposition: A | Discharge status: Home / Self Care | Payer: No Typology Code available for payment source | Source: Ambulatory Visit | Attending: Family Medicine | Admitting: Family Medicine

## 2020-08-06 ENCOUNTER — Ambulatory Visit: Payer: No Typology Code available for payment source | Admitting: Family Medicine

## 2020-08-06 ENCOUNTER — Other Ambulatory Visit: Payer: Self-pay

## 2020-08-06 VITALS — BP 102/90 | HR 100 | Temp 97.7°F | Wt 168.0 lb

## 2020-08-06 DIAGNOSIS — U071 COVID-19: Secondary | ICD-10-CM | POA: Diagnosis not present

## 2020-08-06 NOTE — Progress Notes (Signed)
Subjective:    Patient ID: Kent Hernandez, male    DOB: 01/17/1986, 34 y.o.   MRN: [id number]  HPI  Was admitted to hospital with covid.  See DC summary below:  Admit date: 07/10/2020 Discharge date: 07/24/2020  Admitted From: Home Disposition: Home  Recommendations for Outpatient Follow-up:  1. Follow ups as below. 2. Please obtain CBC/BMP/Mag at follow up 3. Please follow up on the following pending results: None  Home Health: None Equipment/Devices: Home oxygen  Discharge Condition: Stable CODE STATUS: Full code  Hospital Course: 34 year old male with history of cervical DDD, HTN, GERD, H. pylori infection and allergic rhinitis presenting with COVID-19 symptoms. He tested positive on 07/01/2020 at [facility]. He was exposed to his wife who had URI symptoms before him.  In ED, he was hypoxic requiring 4 L. Inflammatory markers elevated. CTA chest negative for PE but bilateral groundglass opacities. Patient was started on steroid, remdesivir and admitted.   Patient had increased oxygen requirement. Baricitinib added on 07/13/2020. He was transferred to [facility] as there was not SDU or ICU bed at [facility].  On arrival here, sitting in upper 80s to lower 90s on 40 L / 100% HFNC and NRB. Solu-Medrol dose increased. PCCM consulted.   Patient gradually improved.  Eventually weaned down to 4 L by [place].  However, he desaturated 71% with ambulation on room air although he was supposed to be ambulated on oxygen to begin with.  He required up to 6 L to recover to 91%.  However, patient had no dyspnea or respiratory distress.  He felt well and ready to go home.  So he was discharged on 5 L by [place].  He was encouraged to use minimum oxygen to keep his saturation above 90%.  Both patient and patient's wife are educated and comfortable managing his oxygen as instructed.  Return precautions and isolation precautions discussed.   See individual problem list below for  more hospital course.  Discharge Diagnoses:  Acute respiratory failure with hypoxia due to COVID-19 pneumonia: Unvaccinated.  Symptom onset on 06/30/2020.  Tested positive on 07/01/2020.  CTA chest negative for PE but groundglass opacities. Procalcitonin negative. Inflammatory markers elevated but down trended.  Required up to 40 L by HFNC/NRB and eventually weaned to 4 L.  Discharged on 5 L by [place]. Recent Labs (last 2 labs)          07/22/20 0359 07/23/20 0407   0.92* 0.90*   803* 777*   1.0* 0.7    -Remdesivir 8/29-9/2.  Baricitinib 8/31-9/11.  Solu-Medrol 8/29-9/7.  -Discharged on short prednisone taper, as needed albuterol, vitamin C, zinc, mucolytic/antitussive. -Recommended activity as tolerated given risk for VTE -Return precautions and isolation precautions discussed.  Hypokalemia-resolved -Continue tomonitor  Mild transaminitis likely related to COVID-19. Resolved.  Mild hyponatremia: Resolved. -Continue monitoring.  GERD -Continue PPI  Dyslipidemia -Continue atorvastatin  Hypertension/sinus tachycardia: Normotensive. Tachycardia resolved. -Discharged on metoprolol 12.5 mg twice daily  08/06/20 Patient is here today for follow-up.  He looks remarkable given his hospital course.  However on examination, he still has fine dry crackles in both lungs left seems to be worse than the right.  He also reports shortness of breath with activity.  He states that he feels like he cannot take a full breath at times.  He also reports some mild discomfort when he tries to breathe deep.  He is maintaining his oxygen saturations 94% today on room air.  He is checking his oxygen  saturations at home and they are staying well above 90.  He still wears his oxygen at night to sleep.  He had questions when he should stop the oxygen.  Overall he seems to be doing well however I think it would be wise to continue it for the next 2 weeks while sleeping just to give his body a chance to  recover.  Patient definitely demonstrates atrophy in his legs however there is no pitting edema and no erythema to suggest a DVT.  His upper body strength is pretty good.  Most of the weakness in his legs is due to deconditioning I feel.  He does have a rash on his back and his shoulders that appears to be a rash from lying in bed with clogged sweat glands.  He states that this is getting better now that he is up and moving around.  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  Therefore I think it would be wise to stay out of work for the next 2 weeks simply to avoid contact with the public.  Honestly I do not feel that the patient could tolerate another infection in close proximity to what he is just recovered from.  Therefore I would like him to quarantine at home just out of safety until his body has had a chance to recover.  He is still borderline tachycardic today however his blood pressure is quite low and therefore I am hesitant to increase his metoprolol any further. Past Medical History:  Diagnosis Date  . Allergic rhinitis   . DDD (degenerative disc disease), cervical 2015  . Esophageal reflux   . GERD (gastroesophageal reflux disease)    [employer]reesia 04/12/2020  . H. pylori infection   . Hyperlipidemia    [employer]reesia 04/12/2020  . Hypertension    [employer]reesia 04/12/2020   No past surgical history on file. Current Outpatient Medications on File Prior to Visit  Medication Sig Dispense Refill  . albuterol (VENTOLIN HFA) 108 (90 Base) MCG/ACT inhaler Inhale 2 puffs into the lungs every 6 (six) hours as needed for wheezing or shortness of breath. 1 each 0  . ascorbic acid (VITAMIN C) 500 MG tablet Take 1 tablet (500 mg total) by mouth daily.    Marland Kitchen atorvastatin (LIPITOR) 20 MG tablet Take 1 tablet (20 mg total) by mouth daily. 90 tablet 3  . cetirizine (ZYRTEC) 10 MG tablet Take 10 mg by mouth daily.    . fluticasone (FLONASE) 50 MCG/ACT nasal spray Place 1 spray into both nostrils daily.    Marland Kitchen  guaiFENesin-dextromethorphan (ROBITUSSIN DM) 100-10 MG/5ML syrup Take 10 mLs by mouth every 4 (four) hours as needed for cough. 118 mL 0  . metoprolol tartrate (LOPRESSOR) 25 MG tablet Take 0.5 tablets (12.5 mg total) by mouth 2 (two) times daily. 30 tablet 0  . zinc sulfate 220 (50 Zn) MG capsule Take 1 capsule (220 mg total) by mouth daily. 20 capsule 0   No current facility-administered medications on file prior to visit.   No Known Allergies Social History   Socioeconomic History  . Marital status: Married    Spouse name: Not on file  . Number of children: Not on file  . Years of education: Not on file  . Highest education level: Not on file  Occupational History  . Occupation: Dentist: [employer]  Tobacco Use  . Smoking status: Former Smoker    Types: Cigarettes  . Smokeless tobacco: Former Leisure centre manager  date: 02/12/2011  . Tobacco comment: Light use  Substance and Sexual Activity  . Alcohol use: Yes    Comment: occ  . Drug use: No  . Sexual activity: Yes  Other Topics Concern  . Not on file  Social History Narrative  . Not on file   Social Determinants of Health   Financial Resource Strain:   . Difficulty of Paying Living Expenses: Not on file  Food Insecurity:   . Worried About Charity fundraiser in the Last Year: Not on file  . Ran Out of Food in the Last Year: Not on file  Transportation Needs:   . Lack of Transportation (Medical): Not on file  . Lack of Transportation (Non-Medical): Not on file  Physical Activity:   . Days of Exercise per Week: Not on file  . Minutes of Exercise per Session: Not on file  Stress:   . Feeling of Stress : Not on file  Social Connections:   . Frequency of Communication with Friends and Family: Not on file  . Frequency of Social Gatherings with Friends and Family: Not on file  . Attends Religious Services: Not on file  . Active Member of Clubs or Organizations: Not on file  . Attends Theatre manager Meetings: Not on file  . Marital Status: Not on file  Intimate Partner Violence:   . Fear of Current or Ex-Partner: Not on file  . Emotionally Abused: Not on file  . Physically Abused: Not on file  . Sexually Abused: Not on file   Family History  Problem Relation Age of Onset  . Diabetes Maternal Uncle   . Diabetes Paternal Grandfather   . Heart disease Paternal Grandfather   . Hypertension Father   . Kidney disease Father   . Cancer Maternal Grandmother   . Heart disease Paternal Grandmother   '   Review of Systems  All other systems reviewed and are negative.      Objective:   Physical Exam Vitals reviewed.  Constitutional:      General: He is not in acute distress.    Appearance: He is well-developed. He is not diaphoretic.  HENT:     Head: Normocephalic and atraumatic.     Right Ear: External ear normal.     Left Ear: External ear normal.     Nose: Nose normal.     Mouth/Throat:     Pharynx: No oropharyngeal exudate.  Eyes:     General: No scleral icterus.       Right eye: No discharge.        Left eye: No discharge.     Conjunctiva/sclera: Conjunctivae normal.     Pupils: Pupils are equal, round, and reactive to light.  Neck:     Thyroid: No thyromegaly.     Vascular: No JVD.     Trachea: No tracheal deviation.  Cardiovascular:     Rate and Rhythm: Regular rhythm. Tachycardia present.     Heart sounds: Normal heart sounds. No murmur heard.  No friction rub. No gallop.   Pulmonary:     Effort: Pulmonary effort is normal. No respiratory distress.     Breath sounds: No stridor. Rales present. No wheezing.  Chest:     Chest wall: No tenderness.  Abdominal:     General: Bowel sounds are normal. There is no distension.     Palpations: Abdomen is soft. There is no mass.     Tenderness: There is no abdominal tenderness. There is  no guarding or rebound.  Musculoskeletal:        General: No tenderness or deformity. Normal range of motion.      Cervical back: Normal range of motion and neck supple.  Lymphadenopathy:     Cervical: No cervical adenopathy.  Skin:    General: Skin is warm.     Coloration: Skin is not pale.     Findings: No erythema or rash.  Neurological:     Mental Status: He is alert and oriented to person, place, and time.     Cranial Nerves: No cranial nerve deficit.     Motor: No abnormal muscle tone.     Coordination: Coordination normal.     Deep Tendon Reflexes: Reflexes are normal and symmetric.  Psychiatric:        Behavior: Behavior normal.        Thought Content: Thought content normal.        Judgment: Judgment normal.           Assessment & Plan:  COVID-19 - Plan: DG Chest 2 View, COMPLETE METABOLIC PANEL WITH GFR, CBC with Differential/Platelet, Magnesium  Overall the patient seems to be doing extremely well given what he is come through.  I would like to continue oxygen at night 2 L with sleeping for the next 2 weeks.  I requested that the patient stay out of work for the next 2 weeks just to avoid another infection until he is stronger.  Hopefully in 2 weeks he can return to work.  We may consider light duty just depending on his stamina and conditioning at that point.  I will obtain a chest x-ray today as the patient does have fine bilateral crackles on his lung exam which I feel is likely due to scarring but I would like to rule out any infiltrate or evidence of pneumonia.  Check CBC, CMP, magnesium level.  Reassess in 2 weeks or immediately if worsening.

## 2020-08-07 ENCOUNTER — Encounter: Payer: Self-pay | Admitting: Family Medicine

## 2020-08-07 LAB — CBC WITH DIFFERENTIAL/PLATELET
Absolute Monocytes: 990 cells/uL — ABNORMAL HIGH (ref 200–950)
Basophils Absolute: 71 cells/uL (ref 0–200)
Basophils Relative: 0.7 %
Eosinophils Absolute: 566 cells/uL — ABNORMAL HIGH (ref 15–500)
Eosinophils Relative: 5.6 %
HCT: 42.8 % (ref 38.5–50.0)
Hemoglobin: 14.2 g/dL (ref 13.2–17.1)
Lymphs Abs: 2283 cells/uL (ref 850–3900)
MCH: 29.6 pg (ref 27.0–33.0)
MCHC: 33.2 g/dL (ref 32.0–36.0)
MCV: 89.2 fL (ref 80.0–100.0)
MPV: 10.7 fL (ref 7.5–12.5)
Monocytes Relative: 9.8 %
Neutro Abs: 6191 cells/uL (ref 1500–7800)
Neutrophils Relative %: 61.3 %
Platelets: 227 10*3/uL (ref 140–400)
RBC: 4.8 10*6/uL (ref 4.20–5.80)
RDW: 13.8 % (ref 11.0–15.0)
Total Lymphocyte: 22.6 %
WBC: 10.1 10*3/uL (ref 3.8–10.8)

## 2020-08-07 LAB — COMPLETE METABOLIC PANEL WITH GFR
AG Ratio: 1.6 (calc) (ref 1.0–2.5)
ALT: 94 U/L — ABNORMAL HIGH (ref 9–46)
AST: 56 U/L — ABNORMAL HIGH (ref 10–40)
Albumin: 4.1 g/dL (ref 3.6–5.1)
Alkaline phosphatase (APISO): 93 U/L (ref 36–130)
BUN: 13 mg/dL (ref 7–25)
CO2: 29 mmol/L (ref 20–32)
Calcium: 9.3 mg/dL (ref 8.6–10.3)
Chloride: 102 mmol/L (ref 98–110)
Creat: 0.82 mg/dL (ref 0.60–1.35)
GFR, Est African American: 132 mL/min/{1.73_m2} (ref >=60)
GFR, Est Non African American: 114 mL/min/{1.73_m2} (ref >=60)
Globulin: 2.6 g/dL (calc) (ref 1.9–3.7)
Glucose, Bld: 96 mg/dL (ref 65–99)
Potassium: 4 mmol/L (ref 3.5–5.3)
Sodium: 140 mmol/L (ref 135–146)
Total Bilirubin: 1 mg/dL (ref 0.2–1.2)
Total Protein: 6.7 g/dL (ref 6.1–8.1)

## 2020-08-07 LAB — MAGNESIUM: Magnesium: 1.6 mg/dL (ref 1.5–2.5)

## 2020-08-09 ENCOUNTER — Other Ambulatory Visit: Payer: Self-pay | Admitting: *Deleted

## 2020-08-09 DIAGNOSIS — R7989 Other specified abnormal findings of blood chemistry: Secondary | ICD-10-CM

## 2020-08-20 ENCOUNTER — Encounter: Payer: Self-pay | Admitting: Family Medicine

## 2020-08-20 ENCOUNTER — Ambulatory Visit (INDEPENDENT_AMBULATORY_CARE_PROVIDER_SITE_OTHER): Payer: No Typology Code available for payment source | Admitting: Family Medicine

## 2020-08-20 ENCOUNTER — Other Ambulatory Visit: Payer: Self-pay

## 2020-08-20 VITALS — BP 110/90 | HR 100 | Temp 98.1°F | Ht 69.0 in | Wt 171.0 lb

## 2020-08-20 DIAGNOSIS — U071 COVID-19: Secondary | ICD-10-CM

## 2020-08-20 DIAGNOSIS — R7989 Other specified abnormal findings of blood chemistry: Secondary | ICD-10-CM

## 2020-08-20 MED ORDER — METOPROLOL TARTRATE 25 MG PO TABS: 12.5000 mg | ORAL_TABLET | Freq: Two times a day (BID) | ORAL | 3 refills | Status: DC

## 2020-08-20 NOTE — Progress Notes (Signed)
Subjective:    Patient ID: Kent Hernandez, male    DOB: 01/17/1986, 34 y.o.   MRN: [id number]  HPI  Was admitted to hospital with covid.  See DC summary below:  Admit date: 07/10/2020 Discharge date: 07/24/2020  Admitted From: Home Disposition: Home  Recommendations for Outpatient Follow-up:  1. Follow ups as below. 2. Please obtain CBC/BMP/Mag at follow up 3. Please follow up on the following pending results: None  Home Health: None Equipment/Devices: Home oxygen  Discharge Condition: Stable CODE STATUS: Full code  Hospital Course: 34 year old male with history of cervical DDD, HTN, GERD, H. pylori infection and allergic rhinitis presenting with COVID-19 symptoms. He tested positive on 07/01/2020 at [facility]. He was exposed to his wife who had URI symptoms before him.  In ED, he was hypoxic requiring 4 L. Inflammatory markers elevated. CTA chest negative for PE but bilateral groundglass opacities. Patient was started on steroid, remdesivir and admitted.   Patient had increased oxygen requirement. Baricitinib added on 07/13/2020. He was transferred to [facility] as there was not SDU or ICU bed at [facility].  On arrival here, sitting in upper 80s to lower 90s on 40 L / 100% HFNC and NRB. Solu-Medrol dose increased. PCCM consulted.   Patient gradually improved.  Eventually weaned down to 4 L by [place].  However, he desaturated 71% with ambulation on room air although he was supposed to be ambulated on oxygen to begin with.  He required up to 6 L to recover to 91%.  However, patient had no dyspnea or respiratory distress.  He felt well and ready to go home.  So he was discharged on 5 L by [place].  He was encouraged to use minimum oxygen to keep his saturation above 90%.  Both patient and patient's wife are educated and comfortable managing his oxygen as instructed.  Return precautions and isolation precautions discussed.   See individual problem list below for  more hospital course.  Discharge Diagnoses:  Acute respiratory failure with hypoxia due to COVID-19 pneumonia: Unvaccinated.  Symptom onset on 06/30/2020.  Tested positive on 07/01/2020.  CTA chest negative for PE but groundglass opacities. Procalcitonin negative. Inflammatory markers elevated but down trended.  Required up to 40 L by HFNC/NRB and eventually weaned to 4 L.  Discharged on 5 L by [place]. Recent Labs (last 2 labs)          07/22/20 0359 07/23/20 0407   0.92* 0.90*   803* 777*   1.0* 0.7    -Remdesivir 8/29-9/2.  Baricitinib 8/31-9/11.  Solu-Medrol 8/29-9/7.  -Discharged on short prednisone taper, as needed albuterol, vitamin C, zinc, mucolytic/antitussive. -Recommended activity as tolerated given risk for VTE -Return precautions and isolation precautions discussed.  Hypokalemia-resolved -Continue tomonitor  Mild transaminitis likely related to COVID-19. Resolved.  Mild hyponatremia: Resolved. -Continue monitoring.  GERD -Continue PPI  Dyslipidemia -Continue atorvastatin  Hypertension/sinus tachycardia: Normotensive. Tachycardia resolved. -Discharged on metoprolol 12.5 mg twice daily  08/06/20 Patient is here today for follow-up.  He looks remarkable given his hospital course.  However on examination, he still has fine dry crackles in both lungs left seems to be worse than the right.  He also reports shortness of breath with activity.  He states that he feels like he cannot take a full breath at times.  He also reports some mild discomfort when he tries to breathe deep.  He is maintaining his oxygen saturations 94% today on room air.  He is checking his oxygen  saturations at home and they are staying well above 90.  He still wears his oxygen at night to sleep.  He had questions when he should stop the oxygen.  Overall he seems to be doing well however I think it would be wise to continue it for the next 2 weeks while sleeping just to give his body a chance to  recover.  Patient definitely demonstrates atrophy in his legs however there is no pitting edema and no erythema to suggest a DVT.  His upper body strength is pretty good.  Most of the weakness in his legs is due to deconditioning I feel.  He does have a rash on his back and his shoulders that appears to be a rash from lying in bed with clogged sweat glands.  He states that this is getting better now that he is up and moving around.  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  Therefore I think it would be wise to stay out of work for the next 2 weeks simply to avoid contact with the public.  Honestly I do not feel that the patient could tolerate another infection in close proximity to what he is just recovered from.  Therefore I would like him to quarantine at home just out of safety until his body has had a chance to recover.  He is still borderline tachycardic today however his blood pressure is quite low and therefore I am hesitant to increase his metoprolol any further.  At that time, my plan was: Overall the patient seems to be doing extremely well given what he is come through.  I would like to continue oxygen at night 2 L with sleeping for the next 2 weeks.  I requested that the patient stay out of work for the next 2 weeks just to avoid another infection until he is stronger.  Hopefully in 2 weeks he can return to work.  We may consider light duty just depending on his stamina and conditioning at that point.  I will obtain a chest x-ray today as the patient does have fine bilateral crackles on his lung exam which I feel is likely due to scarring but I would like to rule out any infiltrate or evidence of pneumonia.  Check CBC, CMP, magnesium level.  Reassess in 2 weeks or immediately if worsening.  08/20/20 Patient is here today for follow-up.  Previous chest x-ray showed scar tissue formation in the right middle lobe and right upper lobe along with left upper lobe.  Patient states that his breathing has  improved since last visit.  He is no longer requiring oxygen.  He does become winded with activity.  He also continues to have some mild pain with deep inspiration.  He feels very tired at times and has easy fatigability but this is improving.  On exam today he continues to have bilateral crackles consistent with the scar tissue seen on the x-ray.  He denies any fever or chills or cough or hemoptysis.  He does not feel strong enough yet to return to work. Past Medical History:  Diagnosis Date  . Allergic rhinitis   . DDD (degenerative disc disease), cervical 2015  . Esophageal reflux   . GERD (gastroesophageal reflux disease)    [employer]reesia 04/12/2020  . H. pylori infection   . Hyperlipidemia    [employer]reesia 04/12/2020  . Hypertension    [employer]ia 04/12/2020   No past surgical history on file. Current Outpatient Medications on File Prior to Visit  Medication  Sig Dispense Refill  . albuterol (VENTOLIN HFA) 108 (90 Base) MCG/ACT inhaler Inhale 2 puffs into the lungs every 6 (six) hours as needed for wheezing or shortness of breath. 1 each 0  . ascorbic acid (VITAMIN C) 500 MG tablet Take 1 tablet (500 mg total) by mouth daily.    Marland Kitchen atorvastatin (LIPITOR) 20 MG tablet Take 1 tablet (20 mg total) by mouth daily. 90 tablet 3  . cetirizine (ZYRTEC) 10 MG tablet Take 10 mg by mouth daily.    . fluticasone (FLONASE) 50 MCG/ACT nasal spray Place 1 spray into both nostrils daily.    Marland Kitchen guaiFENesin-dextromethorphan (ROBITUSSIN DM) 100-10 MG/5ML syrup Take 10 mLs by mouth every 4 (four) hours as needed for cough. 118 mL 0  . zinc sulfate 220 (50 Zn) MG capsule Take 1 capsule (220 mg total) by mouth daily. 20 capsule 0   No current facility-administered medications on file prior to visit.   No Known Allergies Social History   Socioeconomic History  . Marital status: Married    Spouse name: Not on file  . Number of children: Not on file  . Years of education: Not on file  . Highest education level: Not  on file  Occupational History  . 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 Smoking status: Former Smoker    Types: Cigarettes  . Smokeless tobacco: Former Systems developer    Quit date: 02/12/2011  . Tobacco comment: Light use  Substance and Sexual Activity  . Alcohol use: Yes    Comment: occ  . Drug use: No  . Sexual activity: Yes  Other Topics Concern  . Not on file  Social History Narrative  . Not on file   Social Determinants of Health   Financial Resource Strain:   . Difficulty of Paying Living Expenses: Not on file  Food Insecurity:   . Worried About Charity fundraiser in the Last Year: Not on file  . Ran Out of Food in the Last Year: Not on file  Transportation Needs:   . Lack of Transportation (Medical): Not on file  . Lack of Transportation (Non-Medical): Not on file  Physical Activity:   . Days of Exercise per Week: Not on file  . Minutes of Exercise per Session: Not on file  Stress:   . Feeling of Stress : Not on file  Social Connections:   . Frequency of Communication with Friends and Family: Not on file  . Frequency of Social Gatherings with Friends and Family: Not on file  . Attends Religious Services: Not on file  . Active Member of Clubs or Organizations: Not on file  . Attends Archivist Meetings: Not on file  . Marital Status: Not on file  Intimate Partner Violence:   . Fear of Current or Ex-Partner: Not on file  . Emotionally Abused: Not on file  . Physically Abused: Not on file  . Sexually Abused: Not on file   Family History  Problem Relation Age of Onset  . Diabetes Maternal Uncle   . Diabetes Paternal Grandfather   . Heart disease Paternal Grandfather   . Hypertension Father   . Kidney disease Father   . Cancer Maternal Grandmother   . Heart disease Paternal Grandmother   '   Review of Systems  All other systems reviewed and are negative.      Objective:   Physical Exam Vitals reviewed.    Constitutional:  General: He is not in acute distress.    Appearance: He is well-developed. He is not diaphoretic.  HENT:     Head: Normocephalic and atraumatic.     Right Ear: External ear normal.     Left Ear: External ear normal.     Nose: Nose normal.     Mouth/Throat:     Pharynx: No oropharyngeal exudate.  Eyes:     General: No scleral icterus.       Right eye: No discharge.        Left eye: No discharge.     Conjunctiva/sclera: Conjunctivae normal.     Pupils: Pupils are equal, round, and reactive to light.  Neck:     Thyroid: No thyromegaly.     Vascular: No JVD.     Trachea: No tracheal deviation.  Cardiovascular:     Rate and Rhythm: Regular rhythm. Tachycardia present.     Heart sounds: Normal heart sounds. No murmur heard.  No friction rub. No gallop.   Pulmonary:     Effort: Pulmonary effort is normal. No respiratory distress.     Breath sounds: No stridor. Rales present. No wheezing.  Chest:     Chest wall: No tenderness.  Abdominal:     General: Bowel sounds are normal. There is no distension.     Palpations: Abdomen is soft. There is no mass.     Tenderness: There is no abdominal tenderness. There is no guarding or rebound.  Musculoskeletal:        General: No tenderness or deformity. Normal range of motion.     Cervical back: Normal range of motion and neck supple.  Lymphadenopathy:     Cervical: No cervical adenopathy.  Skin:    General: Skin is warm.     Coloration: Skin is not pale.     Findings: No erythema or rash.  Neurological:     Mental Status: He is alert and oriented to person, place, and time.     Cranial Nerves: No cranial nerve deficit.     Motor: No abnormal muscle tone.     Coordination: Coordination normal.     Deep Tendon Reflexes: Reflexes are normal and symmetric.  Psychiatric:        Behavior: Behavior normal.        Thought Content: Thought content normal.        Judgment: Judgment normal.           Assessment &  Plan:  COVID-19 - Plan: DG Chest 2 View  Elevated LFTs  Recheck the patient in 2 weeks.  At that time I feel that he will be strong enough to return to work.  Repeat chest x-ray in 4 weeks to evaluate for improvement in his lung capacity.  Try to wean down on Toprol to once a day and see if the tachycardia worsens.  Recommended gradually increasing his exercise as tolerated to build up stamina.  Repeat CMP at follow-up visit to monitor his liver function test

## 2020-08-24 ENCOUNTER — Encounter: Payer: Self-pay | Admitting: Family Medicine

## 2020-09-03 ENCOUNTER — Ambulatory Visit (INDEPENDENT_AMBULATORY_CARE_PROVIDER_SITE_OTHER): Payer: No Typology Code available for payment source | Admitting: Family Medicine

## 2020-09-03 ENCOUNTER — Other Ambulatory Visit: Payer: No Typology Code available for payment source

## 2020-09-03 ENCOUNTER — Other Ambulatory Visit: Payer: Self-pay

## 2020-09-03 ENCOUNTER — Ambulatory Visit
Admission: RE | Admit: 2020-09-03 | Discharge: 2020-09-03 | Disposition: A | Discharge status: Home / Self Care | Payer: No Typology Code available for payment source | Source: Ambulatory Visit | Attending: Family Medicine | Admitting: Family Medicine

## 2020-09-03 VITALS — BP 118/80 | HR 78 | Temp 98.0°F

## 2020-09-03 DIAGNOSIS — R7989 Other specified abnormal findings of blood chemistry: Secondary | ICD-10-CM

## 2020-09-03 DIAGNOSIS — Z23 Encounter for immunization: Secondary | ICD-10-CM

## 2020-09-03 DIAGNOSIS — R053 Chronic cough: Secondary | ICD-10-CM

## 2020-09-03 DIAGNOSIS — U071 COVID-19: Secondary | ICD-10-CM

## 2020-09-03 DIAGNOSIS — Z13220 Encounter for screening for lipoid disorders: Secondary | ICD-10-CM | POA: Diagnosis not present

## 2020-09-03 DIAGNOSIS — Z8616 Personal history of COVID-19: Secondary | ICD-10-CM

## 2020-09-03 NOTE — Progress Notes (Signed)
Subjective:    Patient ID: Kent Hernandez, male    DOB: 01/17/1986, 34 y.o.   MRN: [id number]  HPI  Was admitted to hospital with covid.  See DC summary below:  Admit date: 07/10/2020 Discharge date: 07/24/2020  Admitted From: Home Disposition: Home  Recommendations for Outpatient Follow-up:  1. Follow ups as below. 2. Please obtain CBC/BMP/Mag at follow up 3. Please follow up on the following pending results: None  Home Health: None Equipment/Devices: Home oxygen  Discharge Condition: Stable CODE STATUS: Full code  Hospital Course: 34 year old male with history of cervical DDD, HTN, GERD, H. pylori infection and allergic rhinitis presenting with COVID-19 symptoms. He tested positive on 07/01/2020 at [facility]. He was exposed to his wife who had URI symptoms before him.  In ED, he was hypoxic requiring 4 L. Inflammatory markers elevated. CTA chest negative for PE but bilateral groundglass opacities. Patient was started on steroid, remdesivir and admitted.   Patient had increased oxygen requirement. Baricitinib added on 07/13/2020. He was transferred to [facility] as there was not SDU or ICU bed at [facility].  On arrival here, sitting in upper 80s to lower 90s on 40 L / 100% HFNC and NRB. Solu-Medrol dose increased. PCCM consulted.   Patient gradually improved.  Eventually weaned down to 4 L by [place].  However, he desaturated 71% with ambulation on room air although he was supposed to be ambulated on oxygen to begin with.  He required up to 6 L to recover to 91%.  However, patient had no dyspnea or respiratory distress.  He felt well and ready to go home.  So he was discharged on 5 L by [place].  He was encouraged to use minimum oxygen to keep his saturation above 90%.  Both patient and patient's wife are educated and comfortable managing his oxygen as instructed.  Return precautions and isolation precautions discussed.   See individual problem list below for  more hospital course.  Discharge Diagnoses:  Acute respiratory failure with hypoxia due to COVID-19 pneumonia: Unvaccinated.  Symptom onset on 06/30/2020.  Tested positive on 07/01/2020.  CTA chest negative for PE but groundglass opacities. Procalcitonin negative. Inflammatory markers elevated but down trended.  Required up to 40 L by HFNC/NRB and eventually weaned to 4 L.  Discharged on 5 L by [place]. Recent Labs (last 2 labs)          07/22/20 0359 07/23/20 0407   0.92* 0.90*   803* 777*   1.0* 0.7    -Remdesivir 8/29-9/2.  Baricitinib 8/31-9/11.  Solu-Medrol 8/29-9/7.  -Discharged on short prednisone taper, as needed albuterol, vitamin C, zinc, mucolytic/antitussive. -Recommended activity as tolerated given risk for VTE -Return precautions and isolation precautions discussed.  Hypokalemia-resolved -Continue tomonitor  Mild transaminitis likely related to COVID-19. Resolved.  Mild hyponatremia: Resolved. -Continue monitoring.  GERD -Continue PPI  Dyslipidemia -Continue atorvastatin  Hypertension/sinus tachycardia: Normotensive. Tachycardia resolved. -Discharged on metoprolol 12.5 mg twice daily  08/06/20 Patient is here today for follow-up.  He looks remarkable given his hospital course.  However on examination, he still has fine dry crackles in both lungs left seems to be worse than the right.  He also reports shortness of breath with activity.  He states that he feels like he cannot take a full breath at times.  He also reports some mild discomfort when he tries to breathe deep.  He is maintaining his oxygen saturations 94% today on room air.  He is checking his oxygen  saturations at home and they are staying well above 90.  He still wears his oxygen at night to sleep.  He had questions when he should stop the oxygen.  Overall he seems to be doing well however I think it would be wise to continue it for the next 2 weeks while sleeping just to give his body a chance to  recover.  Patient definitely demonstrates atrophy in his legs however there is no pitting edema and no erythema to suggest a DVT.  His upper body strength is pretty good.  Most of the weakness in his legs is due to deconditioning I feel.  He does have a rash on his back and his shoulders that appears to be a rash from lying in bed with clogged sweat glands.  He states that this is getting better now that he is up and moving around.  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  Therefore I think it would be wise to stay out of work for the next 2 weeks simply to avoid contact with the public.  Honestly I do not feel that the patient could tolerate another infection in close proximity to what he is just recovered from.  Therefore I would like him to quarantine at home just out of safety until his body has had a chance to recover.  He is still borderline tachycardic today however his blood pressure is quite low and therefore I am hesitant to increase his metoprolol any further.  At that time, my plan was: Overall the patient seems to be doing extremely well given what he is come through.  I would like to continue oxygen at night 2 L with sleeping for the next 2 weeks.  I requested that the patient stay out of work for the next 2 weeks just to avoid another infection until he is stronger.  Hopefully in 2 weeks he can return to work.  We may consider light duty just depending on his stamina and conditioning at that point.  I will obtain a chest x-ray today as the patient does have fine bilateral crackles on his lung exam which I feel is likely due to scarring but I would like to rule out any infiltrate or evidence of pneumonia.  Check CBC, CMP, magnesium level.  Reassess in 2 weeks or immediately if worsening.  08/20/20 Patient is here today for follow-up.  Previous chest x-ray showed scar tissue formation in the right middle lobe and right upper lobe along with left upper lobe.  Patient states that his breathing has  improved since last visit.  He is no longer requiring oxygen.  He does become winded with activity.  He also continues to have some mild pain with deep inspiration.  He feels very tired at times and has easy fatigability but this is improving.  On exam today he continues to have bilateral crackles consistent with the scar tissue seen on the x-ray.  He denies any fever or chills or cough or hemoptysis.  He does not feel strong enough yet to return to work.  At that time, my plan was: Recheck the patient in 2 weeks.  At that time I feel that he will be strong enough to return to work.  Repeat chest x-ray in 4 weeks to evaluate for improvement in his lung capacity.  Try to wean down on Toprol to once a day and see if the tachycardia worsens.  Recommended gradually increasing his exercise as tolerated to build up stamina.  Repeat  CMP at follow-up visit to monitor his liver function test  09/03/20 Patient is feeling much stronger.  He feels ready to return back to work.  He denies shortness of breath at rest.  His shortness of breath with activity has improved dramatically.  He denies any chest pain or pleurisy or hemoptysis.  On physical exam today, the bilateral crackles I heard at his last visit have resolved.  His lungs and breath sounds are abnormal today.  I am very reassured by this.  He is continuing to take metoprolol twice daily  Past Medical History:  Diagnosis Date  . Allergic rhinitis   . DDD (degenerative disc disease), cervical 2015  . Esophageal reflux   . GERD (gastroesophageal reflux disease)    Phreesia 04/12/2020  . H. pylori infection   . Hyperlipidemia    Phreesia 04/12/2020  . Hypertension    Phreesia 04/12/2020   No past surgical history on file. Current Outpatient Medications on File Prior to Visit  Medication Sig Dispense Refill  . albuterol (VENTOLIN HFA) 108 (90 Base) MCG/ACT inhaler Inhale 2 puffs into the lungs every 6 (six) hours as needed for wheezing or shortness of  breath. 1 each 0  . ascorbic acid (VITAMIN C) 500 MG tablet Take 1 tablet (500 mg total) by mouth daily.    Marland Kitchen atorvastatin (LIPITOR) 20 MG tablet Take 1 tablet (20 mg total) by mouth daily. 90 tablet 3  . cetirizine (ZYRTEC) 10 MG tablet Take 10 mg by mouth daily.    . fluticasone (FLONASE) 50 MCG/ACT nasal spray Place 1 spray into both nostrils daily.    Marland Kitchen guaiFENesin-dextromethorphan (ROBITUSSIN DM) 100-10 MG/5ML syrup Take 10 mLs by mouth every 4 (four) hours as needed for cough. 118 mL 0  . metoprolol tartrate (LOPRESSOR) 25 MG tablet Take 0.5 tablets (12.5 mg total) by mouth 2 (two) times daily. 30 tablet 3  . zinc sulfate 220 (50 Zn) MG capsule Take 1 capsule (220 mg total) by mouth daily. 20 capsule 0   No current facility-administered medications on file prior to visit.   No Known Allergies Social History   Socioeconomic History  . Marital status: Married    Spouse name: Not on file  . Number of children: Not on file  . Years of education: Not on file  . Highest education level: Not on file  Occupational History  . 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 Smoking status: Former Smoker    Types: Cigarettes  . Smokeless tobacco: Former Systems developer    Quit date: 02/12/2011  . Tobacco comment: Light use  Substance and Sexual Activity  . Alcohol use: Yes    Comment: occ  . Drug use: No  . Sexual activity: Yes  Other Topics Concern  . Not on file  Social History Narrative  . Not on file   Social Determinants of Health   Financial Resource Strain:   . Difficulty of Paying Living Expenses: Not on file  Food Insecurity:   . Worried About Charity fundraiser in the Last Year: Not on file  . Ran Out of Food in the Last Year: Not on file  Transportation Needs:   . Lack of Transportation (Medical): Not on file  . Lack of Transportation (Non-Medical): Not on file  Physical Activity:   . Days of Exercise per Week: Not on file  . Minutes of Exercise  per Session: Not on file  Stress:   . Feeling  of Stress : Not on file  Social Connections:   . Frequency of Communication with Friends and Family: Not on file  . Frequency of Social Gatherings with Friends and Family: Not on file  . Attends Religious Services: Not on file  . Active Member of Clubs or Organizations: Not on file  . Attends Archivist Meetings: Not on file  . Marital Status: Not on file  Intimate Partner Violence:   . Fear of Current or Ex-Partner: Not on file  . Emotionally Abused: Not on file  . Physically Abused: Not on file  . Sexually Abused: Not on file   Family History  Problem Relation Age of Onset  . Diabetes Maternal Uncle   . Diabetes Paternal Grandfather   . Heart disease Paternal Grandfather   . Hypertension Father   . Kidney disease Father   . Cancer Maternal Grandmother   . Heart disease Paternal Grandmother   '   Review of Systems  All other systems reviewed and are negative.      Objective:   Physical Exam Vitals reviewed.  Constitutional:      General: He is not in acute distress.    Appearance: He is well-developed. He is not diaphoretic.  HENT:     Head: Normocephalic and atraumatic.     Right Ear: External ear normal.     Left Ear: External ear normal.     Nose: Nose normal.     Mouth/Throat:     Pharynx: No oropharyngeal exudate.  Eyes:     General: No scleral icterus.       Right eye: No discharge.        Left eye: No discharge.     Conjunctiva/sclera: Conjunctivae normal.     Pupils: Pupils are equal, round, and reactive to light.  Neck:     Thyroid: No thyromegaly.     Vascular: No JVD.     Trachea: No tracheal deviation.  Cardiovascular:     Rate and Rhythm: Normal rate and regular rhythm.     Heart sounds: Normal heart sounds. No murmur heard.  No friction rub. No gallop.   Pulmonary:     Effort: Pulmonary effort is normal. No respiratory distress.     Breath sounds: No stridor. No wheezing or rales.    Chest:     Chest wall: No tenderness.  Abdominal:     General: Bowel sounds are normal. There is no distension.     Palpations: Abdomen is soft. There is no mass.     Tenderness: There is no abdominal tenderness. There is no guarding or rebound.  Musculoskeletal:        General: No tenderness or deformity. Normal range of motion.     Cervical back: Normal range of motion and neck supple.  Lymphadenopathy:     Cervical: No cervical adenopathy.  Skin:    General: Skin is warm.     Coloration: Skin is not pale.     Findings: No erythema or rash.  Neurological:     Mental Status: He is alert and oriented to person, place, and time.     Cranial Nerves: No cranial nerve deficit.     Motor: No abnormal muscle tone.     Coordination: Coordination normal.     Deep Tendon Reflexes: Reflexes are normal and symmetric.  Psychiatric:        Behavior: Behavior normal.        Thought Content: Thought content normal.  Judgment: Judgment normal.           Assessment & Plan:  COVID-19 - Plan: DG Chest 2 View  Chronic cough - Plan: DG Chest 2 View  Screening cholesterol level - Plan: Lipid panel  Need for immunization against influenza - Plan: Flu Vaccine QUAD 36+ mos IM  Breath sounds are now normal and his hypoxia has resolved.  Clinically I feel the patient can return to work.  He received his flu shot today.  I will check a CMP to monitor his liver function test and while drawing lab work I will check a fasting lipid panel as well.  His breath sounds appear normal now so I will check a chest x-ray just to have a baseline to move forward from so that we will know what opacities have persisted that may be chronic moving forward.

## 2020-09-04 LAB — COMPLETE METABOLIC PANEL WITH GFR
AG Ratio: 1.9 (calc) (ref 1.0–2.5)
ALT: 76 U/L — ABNORMAL HIGH (ref 9–46)
AST: 41 U/L — ABNORMAL HIGH (ref 10–40)
Albumin: 4.4 g/dL (ref 3.6–5.1)
Alkaline phosphatase (APISO): 82 U/L (ref 36–130)
BUN: 13 mg/dL (ref 7–25)
CO2: 25 mmol/L (ref 20–32)
Calcium: 9.6 mg/dL (ref 8.6–10.3)
Chloride: 103 mmol/L (ref 98–110)
Creat: 0.99 mg/dL (ref 0.60–1.35)
GFR, Est African American: 113 mL/min/{1.73_m2} (ref >=60)
GFR, Est Non African American: 98 mL/min/{1.73_m2} (ref >=60)
Globulin: 2.3 g/dL (calc) (ref 1.9–3.7)
Glucose, Bld: 100 mg/dL — ABNORMAL HIGH (ref 65–99)
Potassium: 3.7 mmol/L (ref 3.5–5.3)
Sodium: 138 mmol/L (ref 135–146)
Total Bilirubin: 2.1 mg/dL — ABNORMAL HIGH (ref 0.2–1.2)
Total Protein: 6.7 g/dL (ref 6.1–8.1)

## 2020-09-04 LAB — LIPID PANEL
Cholesterol: 155 mg/dL (ref <200)
HDL: 41 mg/dL (ref >=40)
LDL Cholesterol (Calc): 86 mg/dL (calc)
Non-HDL Cholesterol (Calc): 114 mg/dL (calc) (ref <130)
Total CHOL/HDL Ratio: 3.8 (calc) (ref <5.0)
Triglycerides: 186 mg/dL — ABNORMAL HIGH (ref <150)

## 2020-09-07 ENCOUNTER — Other Ambulatory Visit: Payer: Self-pay

## 2020-09-07 DIAGNOSIS — R7989 Other specified abnormal findings of blood chemistry: Secondary | ICD-10-CM

## 2020-09-09 ENCOUNTER — Telehealth: Payer: Self-pay | Admitting: Family Medicine

## 2020-09-09 ENCOUNTER — Other Ambulatory Visit: Payer: Self-pay | Admitting: Family Medicine

## 2020-09-09 DIAGNOSIS — R7989 Other specified abnormal findings of blood chemistry: Secondary | ICD-10-CM

## 2020-09-14 ENCOUNTER — Encounter: Payer: Self-pay | Admitting: Family Medicine

## 2020-09-15 ENCOUNTER — Ambulatory Visit
Admission: RE | Admit: 2020-09-15 | Discharge: 2020-09-15 | Disposition: A | Discharge status: Home / Self Care | Payer: No Typology Code available for payment source | Source: Ambulatory Visit | Attending: Family Medicine | Admitting: Family Medicine

## 2020-09-15 DIAGNOSIS — R7989 Other specified abnormal findings of blood chemistry: Secondary | ICD-10-CM

## 2020-09-16 ENCOUNTER — Encounter: Payer: Self-pay | Admitting: Family Medicine

## 2020-09-17 ENCOUNTER — Other Ambulatory Visit: Payer: Self-pay

## 2020-09-17 ENCOUNTER — Other Ambulatory Visit: Payer: Self-pay | Admitting: Family Medicine

## 2020-09-17 ENCOUNTER — Telehealth (INDEPENDENT_AMBULATORY_CARE_PROVIDER_SITE_OTHER): Payer: No Typology Code available for payment source | Admitting: Family Medicine

## 2020-09-17 DIAGNOSIS — R160 Hepatomegaly, not elsewhere classified: Secondary | ICD-10-CM | POA: Diagnosis not present

## 2020-09-17 NOTE — Progress Notes (Signed)
Subjective:    Patient ID: Kent Hernandez, male    DOB: [birth year], 34 y.o.   MRN: [id number]  HPI In follow-up for elevated liver function test, the patient recently had a right upper quadrant ultrasound.  The results are included below: IMPRESSION: 1. 4.7 cm lateral segment left lobe liver mass. This does not have ultrasound features typical for an hemangioma. Further evaluation with pre and postcontrast magnetic resonance imaging of the liver is recommended. 2. Diffuse hepatic steatosis.   Patient has CT angiogram in August looking for a pulmonary embolism.  This lesion was also seen on that but at that time it was favored to be a hemangioma: Indeterminate ill-defined 3.4 x 2.8 cm left liver hypodense lesion, likely a hemangioma. Further characterization with MRI without and with contrast on a nonemergent/outpatient basis Recommended.  Patient scheduled a telephone visit to discuss the findings as obviously he is concerned.  Phone call began today at 1055.  Phone call concluded at 1107.  Patient consents to be seen via telephone.  Patient is currently playing golf on a golf course.  I am in my office.  We reviewed the findings of the CT scan as well as ultrasound.  I believe that his elevated liver function test are most likely due to fatty liver disease.  However I explained that the CT scan suggested a hemangioma in the ultrasound suggest that it is not a hemangioma.  Therefore the MRI will be the deciding factor to determine whether the patient requires a biopsy. Past Medical History:  Diagnosis Date  . Allergic rhinitis   . DDD (degenerative disc disease), cervical 2015  . Esophageal reflux   . GERD (gastroesophageal reflux disease)    Phreesia 04/12/2020  . H. pylori infection   . Hyperlipidemia    Phreesia 04/12/2020  . Hypertension    Phreesia 04/12/2020   No past surgical history on file. Current Outpatient Medications on File Prior to Visit  Medication Sig Dispense  Refill  . albuterol (VENTOLIN HFA) 108 (90 Base) MCG/ACT inhaler Inhale 2 puffs into the lungs every 6 (six) hours as needed for wheezing or shortness of breath. 1 each 0  . ascorbic acid (VITAMIN C) 500 MG tablet Take 1 tablet (500 mg total) by mouth daily.    Marland Kitchen atorvastatin (LIPITOR) 20 MG tablet Take 1 tablet (20 mg total) by mouth daily. 90 tablet 3  . cetirizine (ZYRTEC) 10 MG tablet Take 10 mg by mouth daily.    . fluticasone (FLONASE) 50 MCG/ACT nasal spray Place 1 spray into both nostrils daily.    Marland Kitchen guaiFENesin-dextromethorphan (ROBITUSSIN DM) 100-10 MG/5ML syrup Take 10 mLs by mouth every 4 (four) hours as needed for cough. 118 mL 0  . metoprolol tartrate (LOPRESSOR) 25 MG tablet Take 0.5 tablets (12.5 mg total) by mouth 2 (two) times daily. 30 tablet 3  . zinc sulfate 220 (50 Zn) MG capsule Take 1 capsule (220 mg total) by mouth daily. 20 capsule 0   No current facility-administered medications on file prior to visit.   No Known Allergies Social History   Socioeconomic History  . Marital status: Married    Spouse name: Not on file  . Number of children: Not on file  . Years of education: Not on file  . Highest education level: Not on file  Occupational History  . 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 Smoking status: Former Smoker    Types: Cigarettes  .  Smokeless tobacco: Former Systems developer    Quit date: 02/12/2011  . Tobacco comment: Light use  Substance and Sexual Activity  . Alcohol use: Yes    Comment: occ  . Drug use: No  . Sexual activity: Yes  Other Topics Concern  . Not on file  Social History Narrative  . Not on file   Social Determinants of Health   Financial Resource Strain:   . Difficulty of Paying Living Expenses: Not on file  Food Insecurity:   . Worried About Charity fundraiser in the Last Year: Not on file  . Ran Out of Food in the Last Year: Not on file  Transportation Needs:   . Lack of Transportation (Medical):  Not on file  . Lack of Transportation (Non-Medical): Not on file  Physical Activity:   . Days of Exercise per Week: Not on file  . Minutes of Exercise per Session: Not on file  Stress:   . Feeling of Stress : Not on file  Social Connections:   . Frequency of Communication with Friends and Family: Not on file  . Frequency of Social Gatherings with Friends and Family: Not on file  . Attends Religious Services: Not on file  . Active Member of Clubs or Organizations: Not on file  . Attends Archivist Meetings: Not on file  . Marital Status: Not on file  Intimate Partner Violence:   . Fear of Current or Ex-Partner: Not on file  . Emotionally Abused: Not on file  . Physically Abused: Not on file  . Sexually Abused: Not on file    Review of Systems  All other systems reviewed and are negative.      Objective:   Physical Exam        Assessment & Plan:  Liver mass  Patient has low risk factors for liver cancer.  He does not drink heavily.  He has no history of hepatitis C to his knowledge.  Hopefully the MRI will show benign tumor or growth similar to a hemangioma and no further work-up will be necessary.  Obviously the patient is concerned.  We will try to schedule the MRI as quickly as possible and then determine if the patient requires biopsy.

## 2020-09-20 ENCOUNTER — Encounter: Payer: Self-pay | Admitting: Family Medicine

## 2020-10-06 ENCOUNTER — Other Ambulatory Visit: Payer: No Typology Code available for payment source

## 2020-10-06 ENCOUNTER — Other Ambulatory Visit: Payer: Self-pay | Admitting: Family Medicine

## 2020-10-06 ENCOUNTER — Other Ambulatory Visit: Payer: Self-pay

## 2020-10-06 ENCOUNTER — Encounter: Payer: Self-pay | Admitting: Family Medicine

## 2020-10-06 ENCOUNTER — Ambulatory Visit
Admission: RE | Admit: 2020-10-06 | Discharge: 2020-10-06 | Disposition: A | Discharge status: Home / Self Care | Payer: No Typology Code available for payment source | Source: Ambulatory Visit | Attending: Family Medicine | Admitting: Family Medicine

## 2020-10-06 DIAGNOSIS — E7800 Pure hypercholesterolemia, unspecified: Secondary | ICD-10-CM

## 2020-10-06 DIAGNOSIS — R7989 Other specified abnormal findings of blood chemistry: Secondary | ICD-10-CM

## 2020-10-06 DIAGNOSIS — R160 Hepatomegaly, not elsewhere classified: Secondary | ICD-10-CM

## 2020-10-06 MED ORDER — GADOBENATE DIMEGLUMINE 529 MG/ML IV SOLN
15.0000 mL | Freq: Once | INTRAVENOUS | Status: AC | PRN
  Administered 2020-10-06: 15 mL via INTRAVENOUS

## 2020-10-06 NOTE — Telephone Encounter (Signed)
Replied via result note to [employer]

## 2020-10-11 ENCOUNTER — Encounter: Payer: Self-pay | Admitting: Family Medicine

## 2020-10-11 DIAGNOSIS — K760 Fatty (change of) liver, not elsewhere classified: Secondary | ICD-10-CM | POA: Insufficient documentation

## 2020-10-11 DIAGNOSIS — E7800 Pure hypercholesterolemia, unspecified: Secondary | ICD-10-CM

## 2020-10-11 MED ORDER — ATORVASTATIN CALCIUM 20 MG PO TABS: 20.0000 mg | ORAL_TABLET | Freq: Every day | ORAL | 3 refills | Status: DC

## 2020-10-22 NOTE — Telephone Encounter (Signed)
ERROR

## 2020-10-25 ENCOUNTER — Ambulatory Visit (INDEPENDENT_AMBULATORY_CARE_PROVIDER_SITE_OTHER): Payer: No Typology Code available for payment source | Admitting: Family Medicine

## 2020-10-25 ENCOUNTER — Other Ambulatory Visit: Payer: Self-pay

## 2020-10-25 VITALS — BP 120/90 | HR 88 | Temp 97.9°F | Ht 69.0 in | Wt 179.0 lb

## 2020-10-25 DIAGNOSIS — D1803 Hemangioma of intra-abdominal structures: Secondary | ICD-10-CM

## 2020-10-25 NOTE — Progress Notes (Signed)
Subjective:    Patient ID: Kent Hernandez, male    DOB: October 05, 1986, 34 y.o.   MRN: [id number]  HPI Patient recently had an MRI for a mass seen in his liver:  Two benign hepatic hemangiomas, largest measuring 3.5 cm and corresponding to the mass seen on recent ultrasound.  Mild diffuse hepatic steatosis.  He is here today to discuss the MRI results.  He is also been occasionally getting a lower right-sided abdominal pain.  The pain lasts just a split second or 2 and then resolves on its own.  There are no exacerbating or alleviating factors.  It is located below the right upper quadrant but is not right lower quadrant.  He denies any association with food.  He denies any nausea or vomiting.  He denies any constipation or melena or hematochezia or diarrhea.  He is pain-free at the present time. Past Medical History:  Diagnosis Date  . Allergic rhinitis   . DDD (degenerative disc disease), cervical 2015  . Esophageal reflux   . Fatty liver disease, nonalcoholic   . GERD (gastroesophageal reflux disease)    Phreesia 04/12/2020  . H. pylori infection   . Hyperlipidemia    Phreesia 04/12/2020  . Hypertension    Phreesia 04/12/2020  . Liver hemangioma    No past surgical history on file. Current Outpatient Medications on File Prior to Visit  Medication Sig Dispense Refill  . albuterol (VENTOLIN HFA) 108 (90 Base) MCG/ACT inhaler Inhale 2 puffs into the lungs every 6 (six) hours as needed for wheezing or shortness of breath. 1 each 0  . ascorbic acid (VITAMIN C) 500 MG tablet Take 1 tablet (500 mg total) by mouth daily.    Marland Kitchen atorvastatin (LIPITOR) 20 MG tablet Take 1 tablet (20 mg total) by mouth daily. 90 tablet 3  . cetirizine (ZYRTEC) 10 MG tablet Take 10 mg by mouth daily.    . fluticasone (FLONASE) 50 MCG/ACT nasal spray Place 1 spray into both nostrils daily.    Marland Kitchen guaiFENesin-dextromethorphan (ROBITUSSIN DM) 100-10 MG/5ML syrup Take 10 mLs by mouth every 4 (four) hours as needed  for cough. 118 mL 0  . zinc sulfate 220 (50 Zn) MG capsule Take 1 capsule (220 mg total) by mouth daily. 20 capsule 0  . atorvastatin (LIPITOR) 20 MG tablet TAKE 1 TABLET BY MOUTH EVERY DAY (Patient not taking: Reported on 10/25/2020) 90 tablet 3  . metoprolol tartrate (LOPRESSOR) 25 MG tablet Take 0.5 tablets (12.5 mg total) by mouth 2 (two) times daily. 30 tablet 3   No current facility-administered medications on file prior to visit.   No Known Allergies Social History   Socioeconomic History  . Marital status: Married    Spouse name: Not on file  . Number of children: Not on file  . Years of education: Not on file  . Highest education level: Not on file  Occupational History  . 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 Smoking status: Former Smoker    Types: Cigarettes  . Smokeless tobacco: Former Systems developer    Quit date: 02/12/2011  . Tobacco comment: Light use  Substance and Sexual Activity  . Alcohol use: Yes    Comment: occ  . Drug use: No  . Sexual activity: Yes  Other Topics Concern  . Not on file  Social History Narrative  . Not on file   Social Determinants of Health   Financial Resource Strain: Not on file  Food Insecurity: Not on file  Transportation Needs: Not on file  Physical Activity: Not on file  Stress: Not on file  Social Connections: Not on file  Intimate Partner Violence: Not on file      Review of Systems  All other systems reviewed and are negative.      Objective:   Physical Exam Vitals reviewed.  Constitutional:      Appearance: Normal appearance.  Cardiovascular:     Rate and Rhythm: Normal rate and regular rhythm.     Heart sounds: Normal heart sounds.  Pulmonary:     Effort: Pulmonary effort is normal. No respiratory distress.     Breath sounds: Normal breath sounds. No wheezing or rales.  Abdominal:     General: Abdomen is flat. Bowel sounds are normal. There is no distension.     Palpations:  Abdomen is soft. There is no mass.     Tenderness: There is no abdominal tenderness. There is no guarding or rebound.     Hernia: No hernia is present.  Neurological:     Mental Status: He is alert.           Assessment & Plan:  Hemangioma of liver  I reassured the patient that I do not feel the hemangiomas will ever cause him a clinical problem.  We did discuss treatment for fatty liver disease including therapeutic lifestyle changes.  I believe the right-sided abdominal pain is most likely a benign musculoskeletal cause and I recommended simply clinical monitoring at the present time.  Recheck immediately if the symptoms change or worsen in any way

## 2020-11-02 ENCOUNTER — Ambulatory Visit: Payer: No Typology Code available for payment source | Attending: Internal Medicine

## 2020-11-02 DIAGNOSIS — Z23 Encounter for immunization: Secondary | ICD-10-CM

## 2020-11-02 NOTE — Progress Notes (Signed)
   Covid-19 Vaccination Clinic  Name:  Kent Hernandez    MRN: [id number] DOB: July 23, 1986  11/02/2020  [name] was observed post Covid-19 immunization for 15 minutes without incident. He was provided with Vaccine Information Sheet and instruction to access the V-Safe system.   [name] was instructed to call 911 with any severe reactions post vaccine: Marland Kitchen Difficulty breathing  . Swelling of face and throat  . A fast heartbeat  . A bad rash all over body  . Dizziness and weakness   Immunizations Administered    Name Date Dose VIS Date Route   Moderna COVID-19 Vaccine 11/02/2020  5:40 PM 0.5 mL 09/01/2020 Intramuscular   Manufacturer: Moderna   Lot: [id number]   [place]: [id number]

## 2020-11-17 ENCOUNTER — Other Ambulatory Visit: Payer: Self-pay | Admitting: Family Medicine

## 2020-11-29 ENCOUNTER — Encounter: Payer: Self-pay | Admitting: Family Medicine

## 2020-11-30 ENCOUNTER — Other Ambulatory Visit: Payer: Self-pay | Admitting: Family Medicine

## 2020-11-30 MED ORDER — CYCLOBENZAPRINE HCL 10 MG PO TABS: 10.0000 mg | ORAL_TABLET | Freq: Three times a day (TID) | ORAL | 0 refills | Status: DC | PRN

## 2020-12-13 ENCOUNTER — Encounter: Payer: Self-pay | Admitting: Family Medicine

## 2020-12-14 ENCOUNTER — Other Ambulatory Visit: Payer: Self-pay

## 2020-12-14 ENCOUNTER — Encounter: Payer: Self-pay | Admitting: Family Medicine

## 2020-12-14 ENCOUNTER — Ambulatory Visit: Payer: No Typology Code available for payment source | Admitting: Family Medicine

## 2020-12-14 VITALS — BP 130/90 | HR 105 | Temp 97.8°F | Ht 69.0 in | Wt 182.0 lb

## 2020-12-14 DIAGNOSIS — R109 Unspecified abdominal pain: Secondary | ICD-10-CM

## 2020-12-14 DIAGNOSIS — R5382 Chronic fatigue, unspecified: Secondary | ICD-10-CM

## 2020-12-14 MED ORDER — DICYCLOMINE HCL 20 MG PO TABS: 20.0000 mg | ORAL_TABLET | Freq: Four times a day (QID) | ORAL | 0 refills | Status: DC | PRN

## 2020-12-14 NOTE — Progress Notes (Signed)
Subjective:    Patient ID: Kent Hernandez, male    DOB: 12-Jan-1986, 35 y.o.   MRN: [id number]  HPI  10/25/20 Patient recently had an MRI for a mass seen in his liver:  FINDINGS: Lower chest: No acute findings.  Hepatobiliary: Mild diffuse hepatic steatosis is seen on chemical shift imaging. A 3.5 cm benign hemangioma is seen in segment 3 of the left lobe, which corresponds to the mass seen on recent ultrasound. A 2nd benign hemangioma is seen in the right hepatic lobe in segment 7 which measures 1.3 cm. No other liver masses are identified. Gallbladder is unremarkable. No evidence of biliary ductal dilatation.  Pancreas:  No mass or inflammatory changes.  Spleen:  Within normal limits in size and appearance.  Adrenals/Urinary Tract: No masses identified. No evidence of hydronephrosis.  Stomach/Bowel: Visualized portion unremarkable.  Vascular/Lymphatic: No pathologically enlarged lymph nodes identified. No abdominal aortic aneurysm.  Other:  None.  Musculoskeletal:  No suspicious bone lesions identified.  He is here today to discuss the MRI results.  He is also been occasionally getting a lower right-sided abdominal pain.  The pain lasts just a split second or 2 and then resolves on its own.  There are no exacerbating or alleviating factors.  It is located below the right upper quadrant but is not right lower quadrant.  He denies any association with food.  He denies any nausea or vomiting.  He denies any constipation or melena or hematochezia or diarrhea.  He is pain-free at the present time.  At that time, my plan was: I reassured the patient that I do not feel the hemangiomas will ever cause him a clinical problem.  We did discuss treatment for fatty liver disease including therapeutic lifestyle changes.  I believe the right-sided abdominal pain is most likely a benign musculoskeletal cause and I recommended simply clinical monitoring at the present time.  Recheck  immediately if the symptoms change or worsen in any way  12/14/20 Prior to the MRI, he had a RUQ [place] that showed no gall stones.  Ever since I saw the patient in December he has been having intermittent right lower quadrant abdominal pain.  Is not localized to one specific area.  When he tries to demonstrate the location he draws a vertical line with his hand from below his ribs all the way down to the inguinal canal.  However on palpation and with his history it tends to be more in the right mid and right lower quadrant.  He states the pain comes and goes at random.  There is no specific trigger that he can identify.  Is not brought on by movement.  It is not brought on by food.  The pain can be moderate in intensity.  It is not severe like a kidney stone.  It can last hours then go away.  It does not seem to be triggered by any food.  He denies any melena.  He denies any hematochezia.  He denies any hematuria or dysuria.  The pain does not seem to radiate but seems to be centered in that area.  He denies any fevers or chills or weight loss.  Due to the fatty liver disease he has been making an effort to eat more healthy and therefore it does not seem to be due to spicy food or junk food.  His history does not sound like biliary dyskinesia.  #1 it does not really seem to be in the right upper quadrant  and it certainly does not seem to have any kind of association with food.  He denies any reflux or heartburn and he is consistently taking Prevacid.  On exam today he is slightly tender to palpation in the right lower quadrant.  However the pain has been coming and going now for almost 2 months so I do not feel that appendicitis is an issue.  He also reports increasing fatigue.  He states that he feels sleepy all the time.  He goes to bed around 9:00 at night.  He wakes up around 6:00 the following morning so he feels like he is getting a good night sleep.  He does not wake up short of breath.  He denies any apneic  episodes.  His wife does say that he snores and his father who is also a thin build does have sleep apnea so that is on the differential diagnosis.  He denies any depression.  He does report decreasing libido.  He denies any ED.  However he does have profound fatigue.  He denies any depression.   Past Medical History:  Diagnosis Date  . Allergic rhinitis   . DDD (degenerative disc disease), cervical 2015  . Esophageal reflux   . Fatty liver disease, nonalcoholic   . GERD (gastroesophageal reflux disease)    [facility]reesia 04/12/2020  . H. pylori infection   . Hyperlipidemia    [facility]reesia 04/12/2020  . Hypertension    [facility]reesia 04/12/2020  . Liver hemangioma    No past surgical history on file. Current Outpatient Medications on File Prior to Visit  Medication Sig Dispense Refill  . albuterol (VENTOLIN HFA) 108 (90 Base) MCG/ACT inhaler Inhale 2 puffs into the lungs every 6 (six) hours as needed for wheezing or shortness of breath. 1 each 0  . ascorbic acid (VITAMIN C) 500 MG tablet Take 1 tablet (500 mg total) by mouth daily.    Marland Kitchen atorvastatin (LIPITOR) 20 MG tablet TAKE 1 TABLET BY MOUTH EVERY DAY (Patient not taking: Reported on 10/25/2020) 90 tablet 3  . atorvastatin (LIPITOR) 20 MG tablet Take 1 tablet (20 mg total) by mouth daily. 90 tablet 3  . cetirizine (ZYRTEC) 10 MG tablet Take 10 mg by mouth daily.    . cyclobenzaprine (FLEXERIL) 10 MG tablet Take 1 tablet (10 mg total) by mouth 3 (three) times daily as needed for muscle spasms. 30 tablet 0  . fluticasone (FLONASE) 50 MCG/ACT nasal spray Place 1 spray into both nostrils daily.    Marland Kitchen guaiFENesin-dextromethorphan (ROBITUSSIN DM) 100-10 MG/5ML syrup Take 10 mLs by mouth every 4 (four) hours as needed for cough. 118 mL 0  . metoprolol tartrate (LOPRESSOR) 25 MG tablet TAKE 0.5 TABLETS BY MOUTH 2 TIMES DAILY. 90 tablet 1  . zinc sulfate 220 (50 Zn) MG capsule Take 1 capsule (220 mg total) by mouth daily. 20 capsule 0   No current  facility-administered medications on file prior to visit.   No Known Allergies Social History   Socioeconomic History  . Marital status: Married    Spouse name: Not on file  . Number of children: Not on file  . Years of education: Not on file  . Highest education level: Not on file  Occupational History  . Occupation: Dentist: [employer]  Tobacco Use  . Smoking status: Former Smoker    Types: Cigarettes  . Smokeless tobacco: Former Systems developer    Quit date: 02/12/2011  . Tobacco comment: Light use  Substance and  Sexual Activity  . Alcohol use: Yes    Comment: occ  . Drug use: No  . Sexual activity: Yes  Other Topics Concern  . Not on file  Social History Narrative  . Not on file   Social Determinants of Health   Financial Resource Strain: Not on file  Food Insecurity: Not on file  Transportation Needs: Not on file  Physical Activity: Not on file  Stress: Not on file  Social Connections: Not on file  Intimate Partner Violence: Not on file      Review of Systems  All other systems reviewed and are negative.      Objective:   Physical Exam Vitals reviewed.  Constitutional:      Appearance: Normal appearance.  Cardiovascular:     Rate and Rhythm: Normal rate and regular rhythm.     Heart sounds: Normal heart sounds.  Pulmonary:     Effort: Pulmonary effort is normal. No respiratory distress.     Breath sounds: Normal breath sounds. No wheezing or rales.  Abdominal:     General: Abdomen is flat. Bowel sounds are normal. There is no distension.     Palpations: Abdomen is soft. There is no mass.     Tenderness: There is abdominal tenderness. There is no guarding or rebound.     Hernia: No hernia is present.    Neurological:     Mental Status: He is alert.           Assessment & Plan:  Chronic fatigue - Plan: CBC with Differential/Platelet, COMPLETE METABOLIC PANEL WITH GFR, TSH, Vitamin B12, Testosterone Total,Free,Bio,  Males  Recurring abdominal pain  I am not certain of the cause of his abdominal pain.  Most likely I feel that he has intestinal spasms likely related to IBS.  I will treat this empirically with Bentyl 20 mg every 6 hours as needed and I have asked the patient to call me back in 1 week to see if this is helping.  If is not helping, the next step would be to obtain a CT scan of the abdomen and pelvis.  He is tender somewhat to palpation of the right lower quadrant so I want to see if there is any evidence of abdominal wall thickening in the right lower quadrant near the cecum.  I do not feel that this is appendicitis.  If the CT scan is normal, I would next consult GI however my leading suspicion is IBS.  Regarding his fatigue, this could be long-haul her symptoms from Covid however I would check a CBC, CMP, TSH, B12, and testosterone panel.  Consider a sleep study if lab work is normal.

## 2020-12-15 ENCOUNTER — Encounter: Payer: Self-pay | Admitting: Family Medicine

## 2020-12-15 LAB — COMPLETE METABOLIC PANEL WITH GFR
AG Ratio: 1.8 (calc) (ref 1.0–2.5)
ALT: 53 U/L — ABNORMAL HIGH (ref 9–46)
AST: 33 U/L (ref 10–40)
Albumin: 4.8 g/dL (ref 3.6–5.1)
Alkaline phosphatase (APISO): 93 U/L (ref 36–130)
BUN: 14 mg/dL (ref 7–25)
CO2: 30 mmol/L (ref 20–32)
Calcium: 10.2 mg/dL (ref 8.6–10.3)
Chloride: 100 mmol/L (ref 98–110)
Creat: 1.13 mg/dL (ref 0.60–1.35)
GFR, Est African American: 96 mL/min/{1.73_m2} (ref >=60)
GFR, Est Non African American: 83 mL/min/{1.73_m2} (ref >=60)
Globulin: 2.7 g/dL (calc) (ref 1.9–3.7)
Glucose, Bld: 79 mg/dL (ref 65–99)
Potassium: 4.4 mmol/L (ref 3.5–5.3)
Sodium: 140 mmol/L (ref 135–146)
Total Bilirubin: 1.3 mg/dL — ABNORMAL HIGH (ref 0.2–1.2)
Total Protein: 7.5 g/dL (ref 6.1–8.1)

## 2020-12-15 LAB — CBC WITH DIFFERENTIAL/PLATELET
Absolute Monocytes: 762 cells/uL (ref 200–950)
Basophils Absolute: 72 cells/uL (ref 0–200)
Basophils Relative: 0.7 %
Eosinophils Absolute: 185 cells/uL (ref 15–500)
Eosinophils Relative: 1.8 %
HCT: 49.4 % (ref 38.5–50.0)
Hemoglobin: 17.2 g/dL — ABNORMAL HIGH (ref 13.2–17.1)
Lymphs Abs: 3183 cells/uL (ref 850–3900)
MCH: 29.5 pg (ref 27.0–33.0)
MCHC: 34.8 g/dL (ref 32.0–36.0)
MCV: 84.6 fL (ref 80.0–100.0)
MPV: 10.3 fL (ref 7.5–12.5)
Monocytes Relative: 7.4 %
Neutro Abs: 6098 cells/uL (ref 1500–7800)
Neutrophils Relative %: 59.2 %
Platelets: 345 10*3/uL (ref 140–400)
RBC: 5.84 10*6/uL — ABNORMAL HIGH (ref 4.20–5.80)
RDW: 12.7 % (ref 11.0–15.0)
Total Lymphocyte: 30.9 %
WBC: 10.3 10*3/uL (ref 3.8–10.8)

## 2020-12-15 LAB — TESTOSTERONE TOTAL,FREE,BIO, MALES
Albumin: 4.8 g/dL (ref 3.6–5.1)
Sex Hormone Binding: 29 nmol/L (ref 10–50)
Testosterone, Bioavailable: 89.7 ng/dL — ABNORMAL LOW (ref >=110.0)
Testosterone, Free: 41 pg/mL — ABNORMAL LOW (ref 46.0–224.0)
Testosterone: 298 ng/dL (ref 250–827)

## 2020-12-15 LAB — TSH: TSH: 4.18 mIU/L (ref 0.40–4.50)

## 2020-12-15 LAB — VITAMIN B12: Vitamin B-12: 413 pg/mL (ref 200–1100)

## 2020-12-20 ENCOUNTER — Encounter: Payer: Self-pay | Admitting: Family Medicine

## 2020-12-20 ENCOUNTER — Other Ambulatory Visit: Payer: Self-pay

## 2020-12-20 ENCOUNTER — Ambulatory Visit: Payer: No Typology Code available for payment source | Admitting: Family Medicine

## 2020-12-20 VITALS — BP 120/80 | HR 95 | Temp 97.9°F | Ht 69.0 in | Wt 183.0 lb

## 2020-12-20 DIAGNOSIS — J039 Acute tonsillitis, unspecified: Secondary | ICD-10-CM

## 2020-12-20 DIAGNOSIS — E291 Testicular hypofunction: Secondary | ICD-10-CM

## 2020-12-20 MED ORDER — AMOXICILLIN 875 MG PO TABS
875.0000 mg | ORAL_TABLET | Freq: Two times a day (BID) | ORAL | 0 refills | Status: DC
Start: 2020-12-20 — End: 2021-02-21

## 2020-12-20 NOTE — Progress Notes (Signed)
Subjective:    Patient ID: Kent Hernandez, male    DOB: 12-Jan-1986, 35 y.o.   MRN: [id number]  HPI  10/25/20 Patient recently had an MRI for a mass seen in his liver:  FINDINGS: Lower chest: No acute findings.  Hepatobiliary: Mild diffuse hepatic steatosis is seen on chemical shift imaging. A 3.5 cm benign hemangioma is seen in segment 3 of the left lobe, which corresponds to the mass seen on recent ultrasound. A 2nd benign hemangioma is seen in the right hepatic lobe in segment 7 which measures 1.3 cm. No other liver masses are identified. Gallbladder is unremarkable. No evidence of biliary ductal dilatation.  Pancreas:  No mass or inflammatory changes.  Spleen:  Within normal limits in size and appearance.  Adrenals/Urinary Tract: No masses identified. No evidence of hydronephrosis.  Stomach/Bowel: Visualized portion unremarkable.  Vascular/Lymphatic: No pathologically enlarged lymph nodes identified. No abdominal aortic aneurysm.  Other:  None.  Musculoskeletal:  No suspicious bone lesions identified.  He is here today to discuss the MRI results.  He is also been occasionally getting a lower right-sided abdominal pain.  The pain lasts just a split second or 2 and then resolves on its own.  There are no exacerbating or alleviating factors.  It is located below the right upper quadrant but is not right lower quadrant.  He denies any association with food.  He denies any nausea or vomiting.  He denies any constipation or melena or hematochezia or diarrhea.  He is pain-free at the present time.  At that time, my plan was: I reassured the patient that I do not feel the hemangiomas will ever cause him a clinical problem.  We did discuss treatment for fatty liver disease including therapeutic lifestyle changes.  I believe the right-sided abdominal pain is most likely a benign musculoskeletal cause and I recommended simply clinical monitoring at the present time.  Recheck  immediately if the symptoms change or worsen in any way  12/14/20 Prior to the MRI, he had a RUQ [place] that showed no gall stones.  Ever since I saw the patient in December he has been having intermittent right lower quadrant abdominal pain.  Is not localized to one specific area.  When he tries to demonstrate the location he draws a vertical line with his hand from below his ribs all the way down to the inguinal canal.  However on palpation and with his history it tends to be more in the right mid and right lower quadrant.  He states the pain comes and goes at random.  There is no specific trigger that he can identify.  Is not brought on by movement.  It is not brought on by food.  The pain can be moderate in intensity.  It is not severe like a kidney stone.  It can last hours then go away.  It does not seem to be triggered by any food.  He denies any melena.  He denies any hematochezia.  He denies any hematuria or dysuria.  The pain does not seem to radiate but seems to be centered in that area.  He denies any fevers or chills or weight loss.  Due to the fatty liver disease he has been making an effort to eat more healthy and therefore it does not seem to be due to spicy food or junk food.  His history does not sound like biliary dyskinesia.  #1 it does not really seem to be in the right upper quadrant  and it certainly does not seem to have any kind of association with food.  He denies any reflux or heartburn and he is consistently taking Prevacid.  On exam today he is slightly tender to palpation in the right lower quadrant.  However the pain has been coming and going now for almost 2 months so I do not feel that appendicitis is an issue.  He also reports increasing fatigue.  He states that he feels sleepy all the time.  He goes to bed around 9:00 at night.  He wakes up around 6:00 the following morning so he feels like he is getting a good night sleep.  He does not wake up short of breath.  He denies any apneic  episodes.  His wife does say that he snores and his father who is also a thin build does have sleep apnea so that is on the differential diagnosis.  He denies any depression.  He does report decreasing libido.  He denies any ED.  However he does have profound fatigue.  He denies any depression.  At that time, my plan was: I am not certain of the cause of his abdominal pain.  Most likely I feel that he has intestinal spasms likely related to IBS.  I will treat this empirically with Bentyl 20 mg every 6 hours as needed and I have asked the patient to call me back in 1 week to see if this is helping.  If is not helping, the next step would be to obtain a CT scan of the abdomen and pelvis.  He is tender somewhat to palpation of the right lower quadrant so I want to see if there is any evidence of abdominal wall thickening in the right lower quadrant near the cecum.  I do not feel that this is appendicitis.  If the CT scan is normal, I would next consult GI however my leading suspicion is IBS.  Regarding his fatigue, this could be long-haul her symptoms from Covid however I would check a CBC, CMP, TSH, B12, and testosterone panel.  Consider a sleep study if lab work is normal.  12/20/20 Patient's testosterone level was low.  Please see the lab work below Office Visit on 12/20/2020  Component Date Value Ref Range Status  . Streptococcus Group A AG 12/20/2020 NOT DETECTED  NOT DETECT Final   Comment: . The American Academy of Pediatrics recommends that a  throat culture be performed if the Streptococcus  Group A rapid antigen assay result is negative,  therefore it will be reflexed to Streptococcus  Group A culture.    Office Visit on 12/14/2020  Component Date Value Ref Range Status  . WBC 12/14/2020 10.3  3.8 - 10.8 Thousand/uL Final  . RBC 12/14/2020 5.84* 4.20 - 5.80 Million/uL Final  . Hemoglobin 12/14/2020 17.2* 13.2 - 17.1 g/dL Final  . HCT 12/14/2020 49.4  38.5 - 50.0 % Final  . MCV 12/14/2020  84.6  80.0 - 100.0 fL Final  . MCH 12/14/2020 29.5  27.0 - 33.0 pg Final  . MCHC 12/14/2020 34.8  32.0 - 36.0 g/dL Final  . RDW 12/14/2020 12.7  11.0 - 15.0 % Final  . Platelets 12/14/2020 345  140 - 400 Thousand/uL Final  . MPV 12/14/2020 10.3  7.5 - 12.5 fL Final  . Neutro Abs 12/14/2020 6,098  1,500 - 7,800 cells/uL Final  . Lymphs Abs 12/14/2020 3,183  850 - 3,900 cells/uL Final  . Absolute Monocytes 12/14/2020 762  200 - 950  cells/uL Final  . Eosinophils Absolute 12/14/2020 185  15 - 500 cells/uL Final  . Basophils Absolute 12/14/2020 72  0 - 200 cells/uL Final  . Neutrophils Relative % 12/14/2020 59.2  % Final  . Total Lymphocyte 12/14/2020 30.9  % Final  . Monocytes Relative 12/14/2020 7.4  % Final  . Eosinophils Relative 12/14/2020 1.8  % Final  . Basophils Relative 12/14/2020 0.7  % Final  . Glucose, Bld 12/14/2020 79  65 - 99 mg/dL Final   Comment: .            Fasting reference interval .   . BUN 12/14/2020 14  7 - 25 mg/dL Final  . Creat 12/14/2020 1.13  0.60 - 1.35 mg/dL Final  . GFR, Est Non African American 12/14/2020 83  > OR = 60 mL/min/1.70m2 Final  . GFR, Est African American 12/14/2020 96  > OR = 60 mL/min/1.41m2 Final  . BUN/Creatinine Ratio [id number] NOT APPLICABLE  6 - 22 (calc) Final  . Sodium 12/14/2020 140  135 - 146 mmol/L Final  . Potassium 12/14/2020 4.4  3.5 - 5.3 mmol/L Final  . Chloride 12/14/2020 100  98 - 110 mmol/L Final  . CO2 12/14/2020 30  20 - 32 mmol/L Final  . Calcium 12/14/2020 10.2  8.6 - 10.3 mg/dL Final  . Total Protein 12/14/2020 7.5  6.1 - 8.1 g/dL Final  . Albumin 12/14/2020 4.8  3.6 - 5.1 g/dL Final  . Globulin 12/14/2020 2.7  1.9 - 3.7 g/dL (calc) Final  . AG Ratio 12/14/2020 1.8  1.0 - 2.5 (calc) Final  . Total Bilirubin 12/14/2020 1.3* 0.2 - 1.2 mg/dL Final  . Alkaline phosphatase (APISO) 12/14/2020 93  36 - 130 U/L Final  . AST 12/14/2020 33  10 - 40 U/L Final  . ALT 12/14/2020 53* 9 - 46 U/L Final  . TSH 12/14/2020 4.18   0.40 - 4.50 mIU/L Final  . Vitamin B-12 12/14/2020 413  200 - 1,100 pg/mL Final  . Testosterone 12/14/2020 298  250 - 827 ng/dL Final  . Albumin 12/14/2020 4.8  3.6 - 5.1 g/dL Final  . Sex Hormone Binding 12/14/2020 29  10 - 50 nmol/L Final  . Testosterone, Free 12/14/2020 41.0* 46.0 - 224.0 pg/mL Final  . Testosterone, Bioavailable 12/14/2020 89.7* 110.0 - 575 ng/dL Final   Patient's free testosterone was 41 and his bioavailable testosterone was 89.  Given the fact he is a 35 year old male I believe that this is abnormally low and likely explains the cause of his fatigue.  However he needs additional work-up to determine the cause of his hypogonadism.  He states that he saw no improvement taking Bentyl over the weekend however he is concerned that his high dose of zinc may be causing his abdominal pain.  I explained to the patient that I am not aware of zinc causing abdominal pain however I do want to give additional time to allow the Bentyl to have an opportunity.  He only took 3 doses over the weekend.  I asked him to stop his zinc and try the Bentyl for the remainder of the week prior to ordering the CAT scan.  The pain is mild but is persistent at times.  Third over the weekend he developed a sore throat.  On examination today both tonsils are red swollen with white exudate covering the surfaces of the tonsils.  He has tender lymphadenopathy bilaterally   Past Medical History:  Diagnosis Date  . Allergic rhinitis   .  DDD (degenerative disc disease), cervical 2015  . Esophageal reflux   . Fatty liver disease, nonalcoholic   . GERD (gastroesophageal reflux disease)    Phreesia 04/12/2020  . H. pylori infection   . Hyperlipidemia    [facility]reesia 04/12/2020  . Hypertension    [facility]ia 04/12/2020  . Liver hemangioma    History reviewed. No pertinent surgical history. Current Outpatient Medications on File Prior to Visit  Medication Sig Dispense Refill  . albuterol (VENTOLIN HFA) 108 (90  Base) MCG/ACT inhaler Inhale 2 puffs into the lungs every 6 (six) hours as needed for wheezing or shortness of breath. 1 each 0  . ascorbic acid (VITAMIN C) 500 MG tablet Take 1 tablet (500 mg total) by mouth daily.    Marland Kitchen atorvastatin (LIPITOR) 20 MG tablet Take 1 tablet (20 mg total) by mouth daily. 90 tablet 3  . cetirizine (ZYRTEC) 10 MG tablet Take 10 mg by mouth daily.    . cyclobenzaprine (FLEXERIL) 10 MG tablet Take 1 tablet (10 mg total) by mouth 3 (three) times daily as needed for muscle spasms. 30 tablet 0  . dicyclomine (BENTYL) 20 MG tablet Take 1 tablet (20 mg total) by mouth every 6 (six) hours as needed for spasms. 30 tablet 0  . fluticasone (FLONASE) 50 MCG/ACT nasal spray Place 1 spray into both nostrils daily.    . lansoprazole (PREVACID) 30 MG capsule Take 30 mg by mouth daily at 12 noon.    . metoprolol tartrate (LOPRESSOR) 25 MG tablet TAKE 0.5 TABLETS BY MOUTH 2 TIMES DAILY. 90 tablet 1  . zinc sulfate 220 (50 Zn) MG capsule Take 1 capsule (220 mg total) by mouth daily. 20 capsule 0   No current facility-administered medications on file prior to visit.   No Known Allergies Social History   Socioeconomic History  . Marital status: Married    Spouse name: Not on file  . Number of children: Not on file  . Years of education: Not on file  . Highest education level: Not on file  Occupational History  . 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 Smoking status: Former Smoker    Types: Cigarettes  . Smokeless tobacco: Former Systems developer    Quit date: 02/12/2011  . Tobacco comment: Light use  Substance and Sexual Activity  . Alcohol use: Yes    Comment: occ  . Drug use: No  . Sexual activity: Yes  Other Topics Concern  . Not on file  Social History Narrative  . Not on file   Social Determinants of Health   Financial Resource Strain: Not on file  Food Insecurity: Not on file  Transportation Needs: Not on file  Physical Activity: Not on  file  Stress: Not on file  Social Connections: Not on file  Intimate Partner Violence: Not on file      Review of Systems  All other systems reviewed and are negative.      Objective:   Physical Exam Vitals reviewed.  Constitutional:      Appearance: Normal appearance.  HENT:     Mouth/Throat:     Pharynx: Pharyngeal swelling and posterior oropharyngeal erythema present.     Tonsils: Tonsillar exudate present.  Cardiovascular:     Rate and Rhythm: Normal rate and regular rhythm.     Heart sounds: Normal heart sounds.  Pulmonary:     Effort: Pulmonary effort is normal. No respiratory distress.     Breath sounds: Normal breath  sounds. No wheezing or rales.  Abdominal:     General: Abdomen is flat. Bowel sounds are normal. There is no distension.     Palpations: Abdomen is soft. There is no mass.     Tenderness: There is abdominal tenderness. There is no guarding or rebound.     Hernia: No hernia is present.    Neurological:     Mental Status: He is alert.           Assessment & Plan:  Tonsillitis - Plan: STREP GROUP A AG, W/REFLEX TO CULT  Hypogonadism in male - Plan: Prolactin, Follicle stimulating hormone, Luteinizing hormone, Testosterone Total,Free,Bio, Males Patient's tonsils are quite impressive.  Strep test is negative today however I will treat the patient empirically given their appearance.  Start amoxicillin 875 mg twice daily for 10 days.  Return fasting first thing in the morning to check a testosterone level, and FSH, and LH, and a prolactin level.  If hypogonadism is confirmed and a prolactinoma is rule out, I would recommend testosterone cypionate 200 mg IM every 2 weeks.  Stop the zinc.  Continue try the Bentyl for the remainder of the week.  If no better by Friday I will order the CAT scan regarding his abdominal pain

## 2020-12-22 LAB — CULTURE, GROUP A STREP
MICRO NUMBER:: 11502425
SPECIMEN QUALITY:: ADEQUATE

## 2020-12-22 LAB — STREP GROUP A AG, W/REFLEX TO CULT: Streptococcus Group A AG: NOT DETECTED

## 2020-12-23 ENCOUNTER — Other Ambulatory Visit: Payer: No Typology Code available for payment source

## 2020-12-23 ENCOUNTER — Other Ambulatory Visit: Payer: Self-pay

## 2020-12-23 DIAGNOSIS — E291 Testicular hypofunction: Secondary | ICD-10-CM

## 2020-12-23 DIAGNOSIS — R5382 Chronic fatigue, unspecified: Secondary | ICD-10-CM

## 2020-12-24 ENCOUNTER — Encounter: Payer: Self-pay | Admitting: Family Medicine

## 2020-12-24 LAB — TESTOSTERONE TOTAL,FREE,BIO, MALES
Albumin: 4.6 g/dL (ref 3.6–5.1)
Sex Hormone Binding: 30 nmol/L (ref 10–50)
Testosterone, Bioavailable: 112.3 ng/dL (ref >=110.0)
Testosterone, Free: 53.5 pg/mL (ref 46.0–224.0)
Testosterone: 383 ng/dL (ref 250–827)

## 2020-12-24 LAB — FSH/LH
FSH: 4.5 m[IU]/mL (ref 1.6–8.0)
LH: 2.7 m[IU]/mL (ref 1.5–9.3)

## 2020-12-24 LAB — PROLACTIN: Prolactin: 5 ng/mL (ref 2.0–18.0)

## 2020-12-27 ENCOUNTER — Encounter: Payer: Self-pay | Admitting: Family Medicine

## 2020-12-28 ENCOUNTER — Other Ambulatory Visit: Payer: Self-pay

## 2020-12-28 DIAGNOSIS — E291 Testicular hypofunction: Secondary | ICD-10-CM

## 2020-12-28 MED ORDER — TESTOSTERONE CYPIONATE 100 MG/ML IM SOLN
100.0000 mg | INTRAMUSCULAR | Status: DC
Start: 2020-12-29 — End: 2021-06-06

## 2021-01-04 ENCOUNTER — Other Ambulatory Visit: Payer: Self-pay | Admitting: Family Medicine

## 2021-01-04 MED ORDER — TESTOSTERONE CYPIONATE 200 MG/ML IM SOLN: 100.0000 mg | INTRAMUSCULAR | 0 refills | Status: DC

## 2021-01-04 NOTE — Telephone Encounter (Signed)
Patient is requesting to try Testosterone.   Ok to order?

## 2021-01-05 ENCOUNTER — Telehealth: Payer: Self-pay | Admitting: *Deleted

## 2021-01-05 NOTE — Telephone Encounter (Signed)
Received request from pharmacy for PA on testosterone cypionate.  PA submitted.   Dx: E21.9- hypogonadism.    Testosterone, Total Testosterone, Free  7-Feb 10am [phone]  10-Feb 8am 41 53.5

## 2021-01-05 NOTE — Telephone Encounter (Signed)
Received immediate determination.    PA approved.   Your PA request has been approved. Additional information will be provided in the approval communication.

## 2021-02-11 ENCOUNTER — Encounter: Payer: Self-pay | Admitting: Family Medicine

## 2021-02-21 ENCOUNTER — Ambulatory Visit: Payer: No Typology Code available for payment source | Admitting: Family Medicine

## 2021-02-21 ENCOUNTER — Other Ambulatory Visit: Payer: Self-pay

## 2021-02-21 ENCOUNTER — Encounter: Payer: Self-pay | Admitting: Family Medicine

## 2021-02-21 VITALS — BP 136/74 | HR 77 | Temp 98.6°F | Resp 16 | Ht 69.0 in | Wt 180.0 lb

## 2021-02-21 DIAGNOSIS — R002 Palpitations: Secondary | ICD-10-CM | POA: Diagnosis not present

## 2021-02-21 MED ORDER — ALPRAZOLAM 0.5 MG PO TABS: 0.5000 mg | ORAL_TABLET | Freq: Three times a day (TID) | ORAL | 0 refills | Status: DC | PRN

## 2021-02-22 ENCOUNTER — Other Ambulatory Visit: Payer: No Typology Code available for payment source

## 2021-02-22 NOTE — Progress Notes (Signed)
Subjective:    Patient ID: Kent Hernandez, male    DOB: 1986/09/14, 35 y.o.   MRN: [id number]  HPI  Patient is a very pleasant 35 year old Caucasian male here today due to palpitations.  He appears extremely anxious.  He states that about 3 weeks ago he started experiencing palpitations in his chest.  Of note, he has been on testosterone for about 6 weeks.  He states that the palpitations feel like a skipped beat.  He also reports a pause after the skipped beat.  These are becoming more frequent and they are also very troublesome to him and anxiety provoking.  He is concerned that there is a major problem in his heart.  He feels weak and tired.  He feels lightheaded at times.  Therefore he started taking his metoprolol consistently and this morning he noticed relative bradycardia with a heart rate in the 50s which made him feel extremely tired and weak.  He is here today for further evaluation.  EKG today shows normal sinus rhythm with normal intervals and a normal axis with no evidence of ischemia or infarction.  QT interval is normal.  During his exam I auscultated his chest for several minutes.  I do believe I heard one PAC however otherwise he maintained normal sinus rhythm throughout. Past Medical History:  Diagnosis Date  . Allergic rhinitis   . DDD (degenerative disc disease), cervical 2015  . Esophageal reflux   . Fatty liver disease, nonalcoholic   . GERD (gastroesophageal reflux disease)    Phreesia 04/12/2020  . H. pylori infection   . Hyperlipidemia    [facility]reesia 04/12/2020  . Hypertension    [facility]ia 04/12/2020  . Liver hemangioma    History reviewed. No pertinent surgical history. Current Outpatient Medications on File Prior to Visit  Medication Sig Dispense Refill  . albuterol (VENTOLIN HFA) 108 (90 Base) MCG/ACT inhaler Inhale 2 puffs into the lungs every 6 (six) hours as needed for wheezing or shortness of breath. 1 each 0  . ascorbic acid (VITAMIN C) 500 MG tablet Take 1  tablet (500 mg total) by mouth daily.    Marland Kitchen atorvastatin (LIPITOR) 20 MG tablet Take 1 tablet (20 mg total) by mouth daily. 90 tablet 3  . cetirizine (ZYRTEC) 10 MG tablet Take 10 mg by mouth daily.    . cyclobenzaprine (FLEXERIL) 10 MG tablet Take 1 tablet (10 mg total) by mouth 3 (three) times daily as needed for muscle spasms. 30 tablet 0  . fluticasone (FLONASE) 50 MCG/ACT nasal spray Place 1 spray into both nostrils daily.    . lansoprazole (PREVACID) 30 MG capsule Take 30 mg by mouth daily at 12 noon.    . metoprolol tartrate (LOPRESSOR) 25 MG tablet TAKE 0.5 TABLETS BY MOUTH 2 TIMES DAILY. 90 tablet 1  . testosterone cypionate (DEPOTESTOSTERONE CYPIONATE) 200 MG/ML injection Inject 0.5 mLs (100 mg total) into the muscle every 14 (fourteen) days. 3 mL 0   Current Facility-Administered Medications on File Prior to Visit  Medication Dose Route Frequency Provider Last Rate Last Admin  . testosterone cypionate (DEPOTESTOTERONE CYPIONATE) injection 100 mg  100 mg Intramuscular Weekly Susy Frizzle, MD       No Known Allergies Social History   Socioeconomic History  . Marital status: Married    Spouse name: Not on file  . Number of children: Not on file  . Years of education: Not on file  . Highest education level: Not on file  Occupational History  .  Occupation: Dentist: [employer]  Tobacco Use  . Smoking status: Former Smoker    Types: Cigarettes  . Smokeless tobacco: Former Systems developer    Quit date: 02/12/2011  . Tobacco comment: Light use  Substance and Sexual Activity  . Alcohol use: Yes    Comment: occ  . Drug use: No  . Sexual activity: Yes  Other Topics Concern  . Not on file  Social History Narrative  . Not on file   Social Determinants of Health   Financial Resource Strain: Not on file  Food Insecurity: Not on file  Transportation Needs: Not on file  Physical Activity: Not on file  Stress: Not on file  Social Connections: Not on file   Intimate Partner Violence: Not on file      Review of Systems  All other systems reviewed and are negative.      Objective:   Physical Exam Vitals reviewed.  Constitutional:      Appearance: Normal appearance. He is normal weight.  Eyes:     Conjunctiva/sclera: Conjunctivae normal.  Neck:     Thyroid: No thyroid mass, thyromegaly or thyroid tenderness.  Cardiovascular:     Rate and Rhythm: Normal rate and regular rhythm.     Pulses: Normal pulses.     Heart sounds: Normal heart sounds. No murmur heard. No friction rub. No gallop.   Pulmonary:     Effort: Pulmonary effort is normal. No respiratory distress.     Breath sounds: Normal breath sounds. No stridor. No wheezing, rhonchi or rales.  Abdominal:     General: Abdomen is flat. Bowel sounds are normal. There is no distension.     Palpations: Abdomen is soft. There is no mass.     Tenderness: There is no abdominal tenderness. There is no guarding or rebound.     Hernia: No hernia is present.  Musculoskeletal:     Right lower leg: No edema.     Left lower leg: No edema.  Neurological:     General: No focal deficit present.     Mental Status: He is alert and oriented to person, place, and time. Mental status is at baseline.     Cranial Nerves: No cranial nerve deficit.     Sensory: No sensory deficit.     Motor: No weakness.     Coordination: Coordination normal.     Gait: Gait normal.     Deep Tendon Reflexes: Reflexes normal.           Assessment & Plan:  Palpitations - Plan: EKG 12-Lead, CBC with Differential/Platelet, COMPLETE METABOLIC PANEL WITH GFR, TSH, D-dimer, quantitative, Ambulatory referral to Cardiology  I have asked the patient to discontinue testosterone.  However I explained to him the PVCs are most likely what he is experiencing.  I believe that the anxiety that these are provoking in him are likely exacerbating the PVCs due to the increased adrenaline that the anxiety is causing.  He is  already tried to reduce his consumption of caffeine.  I have recommended trying Xanax 0.5 mg every 8 hours as needed to see if this will help him relax and help "prevent or reduce the PVCs that he is experiencing".  I do believe that anxiety is making the situation worse.  However I would recommend cardiology consultation.  Patient would likely benefit from an echocardiogram to rule out mitral valve prolapse or any structural anomalies.  He would also benefit from a cardiac monitor to confirm  that this is PVCs and rule out other potential cardiac arrhythmias.  Also believe that this would give the patient peace of mind which may help reduce some of his anxiety as well.  I will check a CBC to rule out anemia.  I will check a CMP to rule out any electrolyte abnormalities.  I will check a TSH to rule out hypothyroidism.  I will also check a D-dimer given his shortness of breath and palpitations and lightheadedness given his recent Covid as the patient is concerned about blood clots.

## 2021-02-23 ENCOUNTER — Encounter: Payer: Self-pay | Admitting: Family Medicine

## 2021-02-23 LAB — CBC WITH DIFFERENTIAL/PLATELET
Absolute Monocytes: 604 cells/uL (ref 200–950)
Basophils Absolute: 50 cells/uL (ref 0–200)
Basophils Relative: 0.7 %
Eosinophils Absolute: 234 cells/uL (ref 15–500)
Eosinophils Relative: 3.3 %
HCT: 48.9 % (ref 38.5–50.0)
Hemoglobin: 16.6 g/dL (ref 13.2–17.1)
Lymphs Abs: 2208 cells/uL (ref 850–3900)
MCH: 29.9 pg (ref 27.0–33.0)
MCHC: 33.9 g/dL (ref 32.0–36.0)
MCV: 88.1 fL (ref 80.0–100.0)
MPV: 10 fL (ref 7.5–12.5)
Monocytes Relative: 8.5 %
Neutro Abs: 4004 cells/uL (ref 1500–7800)
Neutrophils Relative %: 56.4 %
Platelets: 316 10*3/uL (ref 140–400)
RBC: 5.55 10*6/uL (ref 4.20–5.80)
RDW: 13.1 % (ref 11.0–15.0)
Total Lymphocyte: 31.1 %
WBC: 7.1 10*3/uL (ref 3.8–10.8)

## 2021-02-23 LAB — COMPLETE METABOLIC PANEL WITH GFR
AG Ratio: 1.9 (calc) (ref 1.0–2.5)
ALT: 36 U/L (ref 9–46)
AST: 24 U/L (ref 10–40)
Albumin: 4.7 g/dL (ref 3.6–5.1)
Alkaline phosphatase (APISO): 88 U/L (ref 36–130)
BUN: 17 mg/dL (ref 7–25)
CO2: 26 mmol/L (ref 20–32)
Calcium: 10 mg/dL (ref 8.6–10.3)
Chloride: 101 mmol/L (ref 98–110)
Creat: 1.09 mg/dL (ref 0.60–1.35)
GFR, Est African American: 101 mL/min/{1.73_m2} (ref >=60)
GFR, Est Non African American: 87 mL/min/{1.73_m2} (ref >=60)
Globulin: 2.5 g/dL (calc) (ref 1.9–3.7)
Glucose, Bld: 97 mg/dL (ref 65–99)
Potassium: 4.3 mmol/L (ref 3.5–5.3)
Sodium: 138 mmol/L (ref 135–146)
Total Bilirubin: 1.8 mg/dL — ABNORMAL HIGH (ref 0.2–1.2)
Total Protein: 7.2 g/dL (ref 6.1–8.1)

## 2021-02-23 LAB — TSH: TSH: 3.33 mIU/L (ref 0.40–4.50)

## 2021-02-23 LAB — D-DIMER, QUANTITATIVE: D-Dimer, Quant: <0.19 mcg/mL FEU (ref <0.50)

## 2021-03-08 ENCOUNTER — Encounter: Payer: Self-pay | Admitting: Family Medicine

## 2021-03-21 ENCOUNTER — Encounter: Payer: Self-pay | Admitting: Family Medicine

## 2021-04-04 MED ORDER — METOPROLOL TARTRATE 25 MG PO TABS: ORAL_TABLET | ORAL | 1 refills | Status: DC

## 2021-04-12 ENCOUNTER — Ambulatory Visit (INDEPENDENT_AMBULATORY_CARE_PROVIDER_SITE_OTHER): Payer: No Typology Code available for payment source

## 2021-04-12 ENCOUNTER — Ambulatory Visit (INDEPENDENT_AMBULATORY_CARE_PROVIDER_SITE_OTHER): Payer: No Typology Code available for payment source | Admitting: Cardiovascular Disease

## 2021-04-12 ENCOUNTER — Encounter: Payer: Self-pay | Admitting: Cardiovascular Disease

## 2021-04-12 ENCOUNTER — Other Ambulatory Visit: Payer: Self-pay

## 2021-04-12 VITALS — BP 102/82 | HR 72 | Ht 69.0 in | Wt 182.0 lb

## 2021-04-12 DIAGNOSIS — I10 Essential (primary) hypertension: Secondary | ICD-10-CM

## 2021-04-12 DIAGNOSIS — E782 Mixed hyperlipidemia: Secondary | ICD-10-CM | POA: Diagnosis not present

## 2021-04-12 DIAGNOSIS — U099 Post covid-19 condition, unspecified: Secondary | ICD-10-CM | POA: Insufficient documentation

## 2021-04-12 DIAGNOSIS — R002 Palpitations: Secondary | ICD-10-CM

## 2021-04-12 DIAGNOSIS — U071 COVID-19: Secondary | ICD-10-CM

## 2021-04-12 NOTE — Assessment & Plan Note (Signed)
History of hyperlipidemia on statin therapy with lipid profile performed 09/03/2020 revealing total cholesterol of 155, LDL 86 and HDL 41.

## 2021-04-12 NOTE — Progress Notes (Unsigned)
Enrolled patient for a 14 day Zio XT  monitor to be mailed to patients home  °

## 2021-04-12 NOTE — Assessment & Plan Note (Signed)
History of essential hypertension blood pressure measured today at 102/82.  He is on low-dose beta-blockade.

## 2021-04-12 NOTE — Progress Notes (Signed)
04/12/2021 Baker Pierini   1986/07/08  [id number]  Primary Physician Pickard, Cammie Mcgee, MD Primary Cardiologist: Lorretta Harp MD Lupe Carney, [place]  HPI:  Kent Hernandez is a 35 y.o. 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  He was referred by Dr. Dennard Schaumann, his PCP, for evaluation of palpitations.  He has seen Dr. Burt Knack in the past 11/19/2016 for atypical chest pain.  He had a normal GXT at that time.  He basically has no cardiac risk factors.  There is no family history.  He does not smoke.  He is never had a heart attack or stroke.  He did have COVID 06/30/2020 and was hospitalized on up until 07/24/2020 with acute respiratory insufficiency.  Since that time he has noticed some dyspnea on exertion.  He is also noticed palpitations which have gotten better with discontinuation of caffeine, dipping snuff and limiting stress.   Current Meds  Medication Sig  . albuterol (VENTOLIN HFA) 108 (90 Base) MCG/ACT inhaler Inhale 2 puffs into the lungs every 6 (six) hours as needed for wheezing or shortness of breath.  . ALPRAZolam (XANAX) 0.5 MG tablet Take 1 tablet (0.5 mg total) by mouth 3 (three) times daily as needed.  Marland Kitchen ascorbic acid (VITAMIN C) 500 MG tablet Take 1 tablet (500 mg total) by mouth daily.  Marland Kitchen atorvastatin (LIPITOR) 20 MG tablet Take 1 tablet (20 mg total) by mouth daily.  . cetirizine (ZYRTEC) 10 MG tablet Take 10 mg by mouth daily.  . cyclobenzaprine (FLEXERIL) 10 MG tablet Take 1 tablet (10 mg total) by mouth 3 (three) times daily as needed for muscle spasms.  . fluticasone (FLONASE) 50 MCG/ACT nasal spray Place 1 spray into both nostrils daily.  . lansoprazole (PREVACID) 30 MG capsule Take 30 mg by mouth daily at 12 noon.  . metoprolol tartrate (LOPRESSOR) 25 MG tablet TAKE 0.5 TABLETS BY MOUTH 2 TIMES DAILY.  Marland Kitchen testosterone cypionate (DEPOTESTOSTERONE CYPIONATE) 200 MG/ML  injection Inject 0.5 mLs (100 mg total) into the muscle every 14 (fourteen) days.   Current Facility-Administered Medications for the 04/12/21 encounter (Office Visit) with Lorretta Harp, MD  Medication  . testosterone cypionate (DEPOTESTOTERONE CYPIONATE) injection 100 mg     No Known Allergies  Social History   Socioeconomic History  . Marital status: Married    Spouse name: Not on file  . Number of children: Not on file  . Years of education: Not on file  . Highest education level: Not on file  Occupational History  . 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 Smoking status: Former Smoker    Types: Cigarettes  . Smokeless tobacco: Former Systems developer    Quit date: 02/12/2011  . Tobacco comment: Light use  Substance and Sexual Activity  . Alcohol use: Yes    Comment: occ  . Drug use: No  . Sexual activity: Yes  Other Topics Concern  . Not on file  Social History Narrative  . Not on file   Social Determinants of Health   Financial Resource Strain: Not on file  Food Insecurity: Not on file  Transportation Needs: Not on file  Physical Activity: Not on file  Stress: Not on file  Social Connections: Not on file  Intimate Partner Violence: Not on file     Review of Systems: General: negative for chills,  fever, night sweats or weight changes.  Cardiovascular: negative for chest pain, dyspnea on exertion, edema, orthopnea, palpitations, paroxysmal nocturnal dyspnea or shortness of breath Dermatological: negative for rash Respiratory: negative for cough or wheezing Urologic: negative for hematuria Abdominal: negative for nausea, vomiting, diarrhea, bright red blood per rectum, melena, or hematemesis Neurologic: negative for visual changes, syncope, or dizziness All other systems reviewed and are otherwise negative except as noted above.    Blood pressure 102/82, pulse 72, height 5\' 9"  (1.753 m), weight 182 lb (82.6 kg), SpO2 95 %.  General  appearance: alert and no distress Neck: no adenopathy, no carotid bruit, no JVD, supple, symmetrical, trachea midline and thyroid not enlarged, symmetric, no tenderness/mass/nodules Lungs: clear to auscultation bilaterally Heart: regular rate and rhythm, S1, S2 normal, no murmur, click, rub or gallop Extremities: extremities normal, atraumatic, no cyanosis or edema Pulses: 2+ and symmetric Skin: Skin color, texture, turgor normal. No rashes or lesions Neurologic: Alert and oriented X 3, normal strength and tone. Normal symmetric reflexes. Normal coordination and gait  EKG sinus rhythm at 72 without ST or T wave changes.  I personally reviewed this EKG.  ASSESSMENT AND PLAN:   Hyperlipidemia History of hyperlipidemia on statin therapy with lipid profile performed 09/03/2020 revealing total cholesterol of 155, LDL 86 and HDL 41.  Hypertension History of essential hypertension blood pressure measured today at 102/82.  He is on low-dose beta-blockade.  Palpitations History of palpitations since he was hospitalized with COVID in August of last year.  They are exacerbated by caffeine and dipping as well as stress all of which he has tried to limit.  They have been somewhat improved over the last several weeks.  I am going to get a 2D echo and a 2-week Zio patch to further evaluate      Lorretta Harp MD [facility], [facility] 04/12/2021 1:40 PM

## 2021-04-12 NOTE — Assessment & Plan Note (Signed)
History of palpitations since he was hospitalized with COVID in August of last year.  They are exacerbated by caffeine and dipping as well as stress all of which he has tried to limit.  They have been somewhat improved over the last several weeks.  I am going to get a 2D echo and a 2-week Zio patch to further evaluate

## 2021-04-12 NOTE — Patient Instructions (Signed)
Medication Instructions:  Your physician recommends that you continue on your current medications as directed. Please refer to the Current Medication list given to you today.  *If you need a refill on your cardiac medications before your next appointment, please call your pharmacy*   Testing/Procedures: Your physician has requested that you have an echocardiogram. Echocardiography is a painless test that uses sound waves to create images of your heart. It provides your doctor with information about the size and shape of your heart and how well your heart's chambers and valves are working. This procedure takes approximately one hour. There are no restrictions for this procedure. This procedure is done at [address]rd [address].   ZIO XT- Long Term Monitor Instructions   Your physician has requested you wear a ZIO patch monitor for 14 days.  This is a single patch monitor.   [employer] supplies one patch monitor per enrollment. Additional stickers are not available. Please do not apply patch if you will be having a Nuclear Stress Test, Echocardiogram, Cardiac CT, MRI, or Chest Xray during the period you would be wearing the monitor. The patch cannot be worn during these tests. You cannot remove and re-apply the ZIO XT patch monitor.  Your ZIO patch monitor will be sent Fed Ex from [employer] directly to your home address. It may take 3-5 days to receive your monitor after you have been enrolled.  Once you have received your monitor, please review the enclosed instructions. Your monitor has already been registered assigning a specific monitor serial # to you.  Billing and Patient Assistance Program Information   We have supplied [employer] with any of your insurance information on file for billing purposes. [employer] offers a sliding scale Patient Assistance Program for patients that do not have insurance, or whose insurance does not completely cover the cost of the ZIO monitor.   You must  apply for the Patient Assistance Program to qualify for this discounted rate.     To apply, please call [employer] at [phone], select option 4, then select option 2, and ask to apply for Patient Assistance Program.  Theodore Demark will ask your household income, and how many people are in your household.  They will quote your out-of-pocket cost based on that information.  [employer] will also be able to set up a 47-month, interest-free payment plan if needed.  Applying the monitor   Shave hair from upper left chest.  Hold abrader disc by orange tab. Rub abrader in 40 strokes over the upper left chest as indicated in your monitor instructions.  Clean area with 4 enclosed alcohol pads. Let dry.  Apply patch as indicated in monitor instructions. Patch will be placed under collarbone on left side of chest with arrow pointing upward.  Rub patch adhesive wings for 2 minutes. Remove white label marked "1". Remove the white label marked "2". Rub patch adhesive wings for 2 additional minutes.  While looking in a mirror, press and release button in center of patch. A small green light will flash 3-4 times. This will be your only indicator that the monitor has been turned on. ?   Do not shower for the first 24 hours. You may shower after the first 24 hours.   Press the button if you feel a symptom. You will hear a small click. Record Date, Time and Symptom in the Patient Logbook.  When you are ready to remove the patch, follow instructions on the last 2 pages of the Patient Logbook.  Stick patch monitor onto the last page of Patient Logbook.  Place Patient Logbook in the blue and white box.  Use locking tab on box and tape box closed securely.  The blue and white box has prepaid postage on it. Please place it in the mailbox as soon as possible. Your physician should have your test results approximately 7 days after the monitor has been mailed back to [facility].  Call [place] at [phone] if  you have questions regarding your ZIO XT patch monitor. Call them immediately if you see an orange light blinking on your monitor.  If your monitor falls off in less than 4 days, contact our Monitor department at [phone]. ?If your monitor becomes loose or falls off after 4 days call [employer] at [phone] for suggestions on securing your monitor.?    Follow-Up: At [facility], you and your health needs are our priority.  As part of our continuing mission to provide you with exceptional heart care, we have created designated Provider Care Teams.  These Care Teams include your primary Cardiologist (physician) and Advanced Practice Providers (APPs -  Physician Assistants and Nurse Practitioners) who all work together to provide you with the care you need, when you need it.  We recommend signing up for the patient portal called "MyChart".  Sign up information is provided on this After Visit Summary.  MyChart is used to connect with patients for Virtual Visits (Telemedicine).  Patients are able to view lab/test results, encounter notes, upcoming appointments, etc.  Non-urgent messages can be sent to your provider as well.   To learn more about what you can do with MyChart, go to [email or web address].    Your next appointment:   No future appointments made at this time. We will see you on an as needed basis.  Provider:   Quay Burow, MD

## 2021-05-01 DIAGNOSIS — R002 Palpitations: Secondary | ICD-10-CM | POA: Diagnosis not present

## 2021-05-04 ENCOUNTER — Other Ambulatory Visit: Payer: Self-pay

## 2021-05-04 ENCOUNTER — Ambulatory Visit (HOSPITAL_COMMUNITY): Payer: No Typology Code available for payment source | Attending: Cardiology

## 2021-05-04 DIAGNOSIS — Z8616 Personal history of COVID-19: Secondary | ICD-10-CM | POA: Diagnosis not present

## 2021-05-04 DIAGNOSIS — I10 Essential (primary) hypertension: Secondary | ICD-10-CM | POA: Diagnosis not present

## 2021-05-04 DIAGNOSIS — Z87891 Personal history of nicotine dependence: Secondary | ICD-10-CM | POA: Diagnosis not present

## 2021-05-04 DIAGNOSIS — R002 Palpitations: Secondary | ICD-10-CM | POA: Diagnosis not present

## 2021-05-04 DIAGNOSIS — E785 Hyperlipidemia, unspecified: Secondary | ICD-10-CM | POA: Diagnosis not present

## 2021-05-04 LAB — ECHOCARDIOGRAM COMPLETE
Area-P 1/2: 2.09 cm2
S' Lateral: 3.2 cm

## 2021-05-06 ENCOUNTER — Other Ambulatory Visit (HOSPITAL_COMMUNITY): Payer: No Typology Code available for payment source

## 2021-05-20 ENCOUNTER — Ambulatory Visit: Payer: No Typology Code available for payment source | Admitting: Family Medicine

## 2021-06-06 ENCOUNTER — Other Ambulatory Visit: Payer: Self-pay

## 2021-06-06 ENCOUNTER — Encounter: Payer: Self-pay | Admitting: Family Medicine

## 2021-06-06 ENCOUNTER — Ambulatory Visit (INDEPENDENT_AMBULATORY_CARE_PROVIDER_SITE_OTHER): Payer: No Typology Code available for payment source | Admitting: Family Medicine

## 2021-06-06 VITALS — BP 122/64 | HR 82 | Temp 98.2°F | Resp 14 | Ht 69.0 in | Wt 183.0 lb

## 2021-06-06 DIAGNOSIS — Z0000 Encounter for general adult medical examination without abnormal findings: Secondary | ICD-10-CM

## 2021-06-06 LAB — LIPID PANEL
Cholesterol: 158 mg/dL (ref <200)
HDL: 47 mg/dL (ref >=40)
LDL Cholesterol (Calc): 83 mg/dL (calc)
Non-HDL Cholesterol (Calc): 111 mg/dL (calc) (ref <130)
Total CHOL/HDL Ratio: 3.4 (calc) (ref <5.0)
Triglycerides: 179 mg/dL — ABNORMAL HIGH (ref <150)

## 2021-06-06 LAB — CBC WITH DIFFERENTIAL/PLATELET
Absolute Monocytes: 544 cells/uL (ref 200–950)
Basophils Absolute: 61 cells/uL (ref 0–200)
Basophils Relative: 0.9 %
Eosinophils Absolute: 279 cells/uL (ref 15–500)
Eosinophils Relative: 4.1 %
HCT: 48.9 % (ref 38.5–50.0)
Hemoglobin: 16.4 g/dL (ref 13.2–17.1)
Lymphs Abs: 2135 cells/uL (ref 850–3900)
MCH: 29.4 pg (ref 27.0–33.0)
MCHC: 33.5 g/dL (ref 32.0–36.0)
MCV: 87.8 fL (ref 80.0–100.0)
MPV: 10.1 fL (ref 7.5–12.5)
Monocytes Relative: 8 %
Neutro Abs: 3781 cells/uL (ref 1500–7800)
Neutrophils Relative %: 55.6 %
Platelets: 321 10*3/uL (ref 140–400)
RBC: 5.57 10*6/uL (ref 4.20–5.80)
RDW: 12.4 % (ref 11.0–15.0)
Total Lymphocyte: 31.4 %
WBC: 6.8 10*3/uL (ref 3.8–10.8)

## 2021-06-06 LAB — COMPLETE METABOLIC PANEL WITH GFR
AG Ratio: 2 (calc) (ref 1.0–2.5)
ALT: 38 U/L (ref 9–46)
AST: 25 U/L (ref 10–40)
Albumin: 4.7 g/dL (ref 3.6–5.1)
Alkaline phosphatase (APISO): 90 U/L (ref 36–130)
BUN: 16 mg/dL (ref 7–25)
CO2: 28 mmol/L (ref 20–32)
Calcium: 9.8 mg/dL (ref 8.6–10.3)
Chloride: 103 mmol/L (ref 98–110)
Creat: 0.99 mg/dL (ref 0.60–1.26)
Globulin: 2.4 g/dL (calc) (ref 1.9–3.7)
Glucose, Bld: 86 mg/dL (ref 65–99)
Potassium: 4.4 mmol/L (ref 3.5–5.3)
Sodium: 140 mmol/L (ref 135–146)
Total Bilirubin: 2.4 mg/dL — ABNORMAL HIGH (ref 0.2–1.2)
Total Protein: 7.1 g/dL (ref 6.1–8.1)
eGFR: 101 mL/min/{1.73_m2} (ref >=60)

## 2021-06-06 NOTE — Progress Notes (Signed)
Subjective:    Patient ID: Kent Hernandez, male    DOB: [birth year], 35 y.o.   MRN: [id number]  HPI  Patient is here today for a complete physical exam.  Patient states that he is using his albuterol 1-2 times a week especially when is hot outside.  We discussed seeing a pulmonologist regarding pulmonary function test however at the present time I do not see how that would change management.  I do not believe he is at the point where we would add a long-acting bronchodilator especially given his previous history of PVCs.  He saw cardiology for palpitations and his event monitor did show frequent PVCs and PACs along with a few isolated runs of SVT.  Therefore I am concerned about long-acting bronchodilators potentially exacerbating this.  Patient states that since he started the metoprolol, the palpitations have improved dramatically and he is feeling much better.  Otherwise he is doing well with no concerns. Past Medical History:  Diagnosis Date   Allergic rhinitis    DDD (degenerative disc disease), cervical 2015   Esophageal reflux    Fatty liver disease, nonalcoholic    GERD (gastroesophageal reflux disease)    [facility] 04/12/2020   H. pylori infection    Hyperlipidemia    [facility] 04/12/2020   Hypertension    [facility] 04/12/2020   Liver hemangioma    History reviewed. No pertinent surgical history. Current Outpatient Medications on File Prior to Visit  Medication Sig Dispense Refill   albuterol (VENTOLIN HFA) 108 (90 Base) MCG/ACT inhaler Inhale 2 puffs into the lungs every 6 (six) hours as needed for wheezing or shortness of breath. 1 each 0   ALPRAZolam (XANAX) 0.5 MG tablet Take 1 tablet (0.5 mg total) by mouth 3 (three) times daily as needed. 30 tablet 0   ascorbic acid (VITAMIN C) 500 MG tablet Take 1 tablet (500 mg total) by mouth daily.     atorvastatin (LIPITOR) 20 MG tablet Take 1 tablet (20 mg total) by mouth daily. 90 tablet 3   cetirizine (ZYRTEC) 10 MG tablet Take 10 mg  by mouth daily.     cyclobenzaprine (FLEXERIL) 10 MG tablet Take 1 tablet (10 mg total) by mouth 3 (three) times daily as needed for muscle spasms. 30 tablet 0   fluticasone (FLONASE) 50 MCG/ACT nasal spray Place 1 spray into both nostrils daily.     lansoprazole (PREVACID) 30 MG capsule Take 30 mg by mouth daily at 12 noon.     metoprolol tartrate (LOPRESSOR) 25 MG tablet TAKE 0.5 TABLETS BY MOUTH 2 TIMES DAILY. 90 tablet 1   No current facility-administered medications on file prior to visit.   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: [employer]  Tobacco Use   Smoking status: Former    Types: Cigarettes   Smokeless tobacco: Former    Quit date: 02/12/2011   Tobacco comments:    Light use  Substance and Sexual Activity   Alcohol use: Yes    Comment: occ   Drug use: No   Sexual activity: Yes  Other Topics Concern   Not on file  Social History Narrative   Not on file   Social Determinants of Health  Financial Resource Strain: Not on file  Food Insecurity: Not on file  Transportation Needs: Not on file  Physical Activity: Not on file  Stress: Not on file  Social Connections: Not on file  Intimate Partner Violence: Not on file   Family History  Problem Relation Age of Onset   Diabetes Maternal Uncle    Diabetes Paternal Grandfather    Heart disease Paternal Grandfather    Hypertension Father    Kidney disease Father    Cancer Maternal Grandmother    Heart disease Paternal Grandmother   '   Review of Systems     Objective:   Physical Exam Vitals reviewed.  Constitutional:      General: He is not in acute distress.    Appearance: He is normal weight. He is not ill-appearing, toxic-appearing or diaphoretic.  HENT:      Head: Normocephalic and atraumatic.     Right Ear: Tympanic membrane, ear canal and external ear normal. There is no impacted cerumen.     Left Ear: Tympanic membrane, ear canal and external ear normal. There is no impacted cerumen.     Nose: Nose normal. No congestion or rhinorrhea.     Mouth/Throat:     Mouth: Mucous membranes are moist.     Pharynx: No oropharyngeal exudate or posterior oropharyngeal erythema.  Eyes:     Extraocular Movements: Extraocular movements intact.     Conjunctiva/sclera: Conjunctivae normal.     Pupils: Pupils are equal, round, and reactive to light.  Neck:     Vascular: No carotid bruit.  Cardiovascular:     Rate and Rhythm: Normal rate and regular rhythm.     Pulses: Normal pulses.     Heart sounds: Normal heart sounds. No murmur heard.   No friction rub. No gallop.  Pulmonary:     Effort: Pulmonary effort is normal. No respiratory distress.     Breath sounds: Normal breath sounds. No stridor. No wheezing, rhonchi or rales.  Abdominal:     General: Abdomen is flat. Bowel sounds are normal. There is no distension.     Palpations: Abdomen is soft.     Tenderness: There is no abdominal tenderness. There is no guarding or rebound.     Hernia: No hernia is present.  Musculoskeletal:     Cervical back: Normal range of motion and neck supple. No rigidity or tenderness.     Right lower leg: No edema.     Left lower leg: No edema.  Lymphadenopathy:     Cervical: No cervical adenopathy.  Skin:    General: Skin is warm.     Coloration: Skin is not jaundiced or pale.     Findings: No bruising, erythema, lesion or rash.  Neurological:     General: No focal deficit present.     Mental Status: He is alert and oriented to person, place, and time. Mental status is at baseline.     Cranial Nerves: No cranial nerve deficit.     Sensory: No sensory deficit.     Motor: No weakness.     Coordination: Coordination normal.     Gait: Gait normal.     Deep Tendon  Reflexes: Reflexes normal.  Psychiatric:        Mood and Affect: Mood normal.        Behavior: Behavior normal.        Thought Content: Thought content normal.        Judgment: Judgment normal.  Assessment & Plan:  General medical exam - Plan: CBC with Differential/Platelet, COMPLETE METABOLIC PANEL WITH GFR, Lipid panel Physical exam today is normal.  Check CBC, CMP, fasting lipid panel.  Blood pressure today is excellent.  I do not feel that the patient requires a pulmonologist at this time.  I do not feel that he would benefit from a long-acting bronchodilator and I am concerned that it might exacerbate his palpitations which were very problematic for the patient a few months ago.  Therefore we have decided not to pursue pulmonary function testing as long as he is using the albuterol sparingly.  However if his albuterol use increases or if his dyspnea increases, I would be happy to schedule the patient to see a pulmonologist for pulmonary function test.

## 2021-06-07 ENCOUNTER — Encounter: Payer: Self-pay | Admitting: Family Medicine

## 2021-06-07 ENCOUNTER — Other Ambulatory Visit: Payer: No Typology Code available for payment source

## 2021-06-07 ENCOUNTER — Other Ambulatory Visit: Payer: Self-pay | Admitting: *Deleted

## 2021-06-07 DIAGNOSIS — R17 Unspecified jaundice: Secondary | ICD-10-CM

## 2021-06-08 ENCOUNTER — Encounter: Payer: Self-pay | Admitting: Family Medicine

## 2021-06-08 LAB — BILIRUBIN, FRACTIONATED(TOT/DIR/INDIR)
Bilirubin, Direct: 0.4 mg/dL — ABNORMAL HIGH (ref 0.0–0.2)
Indirect Bilirubin: 2.2 mg/dL (calc) — ABNORMAL HIGH (ref 0.2–1.2)
Total Bilirubin: 2.6 mg/dL — ABNORMAL HIGH (ref 0.2–1.2)

## 2021-06-09 ENCOUNTER — Encounter: Payer: Self-pay | Admitting: Family Medicine

## 2021-06-21 ENCOUNTER — Encounter: Payer: Self-pay | Admitting: Family Medicine

## 2021-08-04 IMAGING — CT CT ANGIO CHEST
2 of 6 series · 18 of 46 positions shown · IV contrast (Omnipaque or Isovue)
Comparison: Chest radiograph dated 07/07/2020.

CLINICAL DATA: 33-year-old male with concern for pulmonary
embolism. Positive [id number].

EXAM:
CT ANGIOGRAPHY CHEST WITH CONTRAST
TECHNIQUE: Multidetector CT imaging of the chest was performed using the
standard protocol during bolus administration of intravenous
contrast. Multiplanar CT image reconstructions and MIPs were
obtained to evaluate the vascular anatomy.
CONTRAST:  75mL OMNIPAQUE IOHEXOL 350 MG/ML SOLN

[Series 6: pe axial thins · axial · 0.84mm/px · z∈[-619,-313]mm · 15 of 336 slices shown]
[im 15/336  lung]
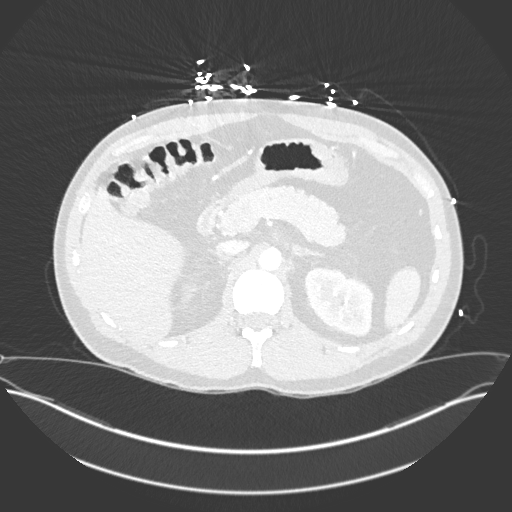
[im 44/336  soft-tissue]
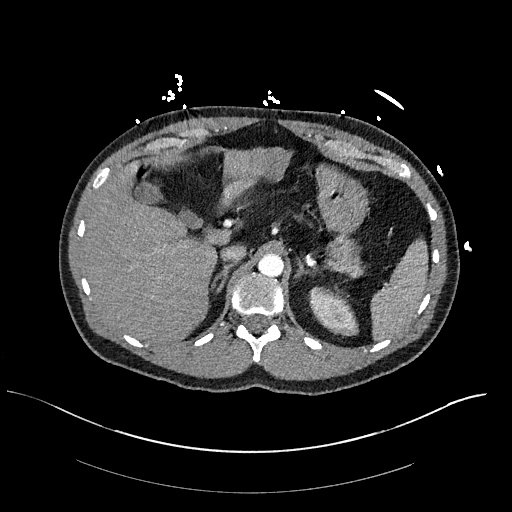
[im 59/336  lung]
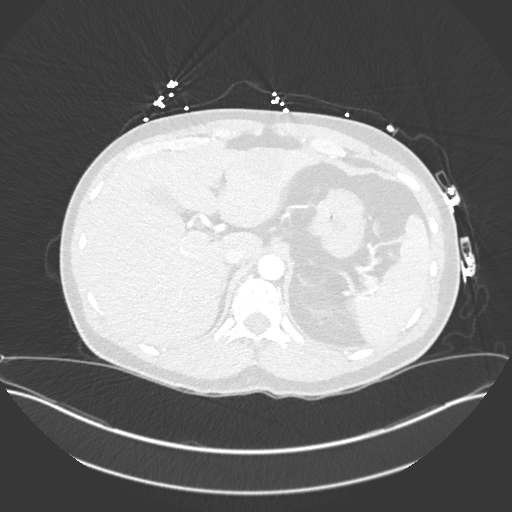
[im 88/336  soft-tissue]
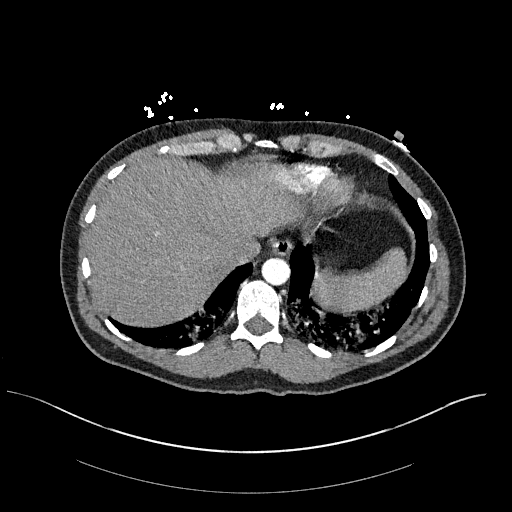
[im 102/336  lung]
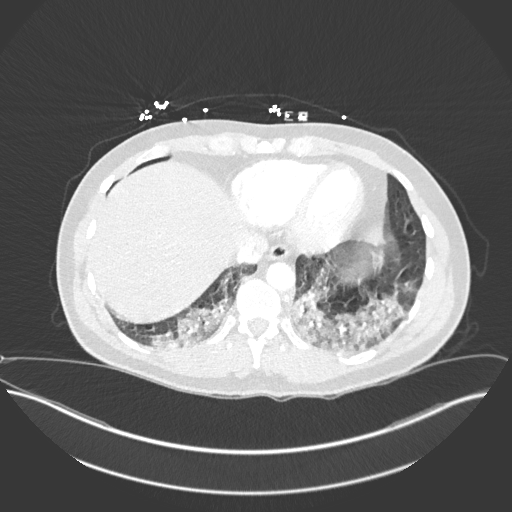
[im 132/336  soft-tissue]
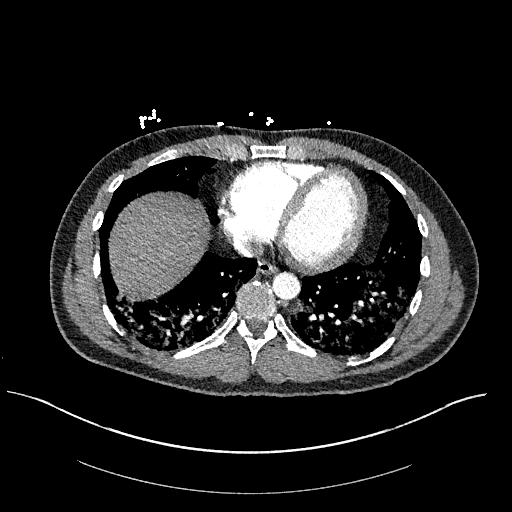
[im 146/336  lung]
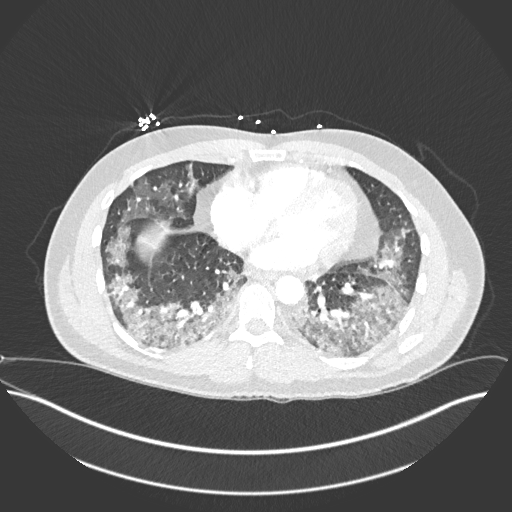
[im 175/336  soft-tissue]
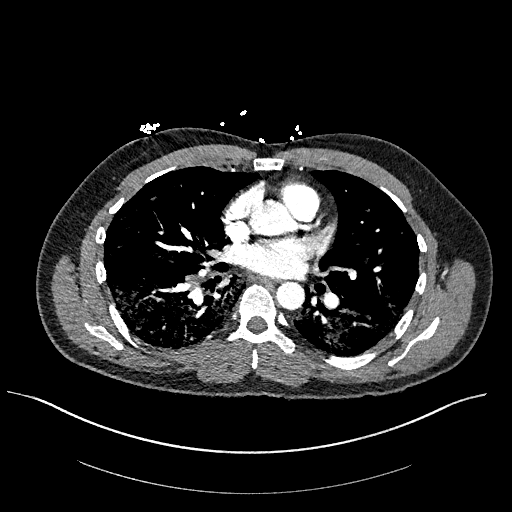
[im 190/336  lung]
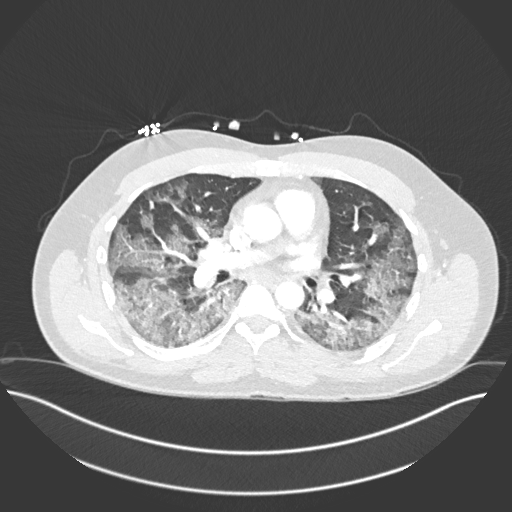
[im 204/336  soft-tissue]
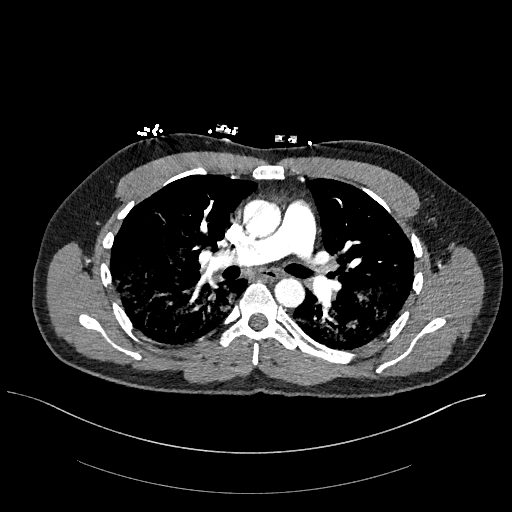
[im 234/336  lung]
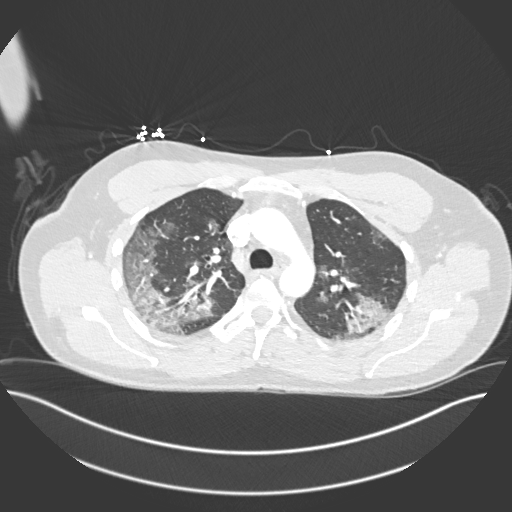
[im 248/336  soft-tissue]
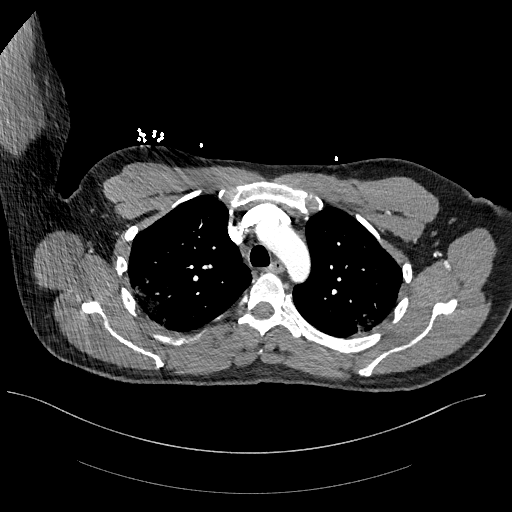
[im 277/336  lung]
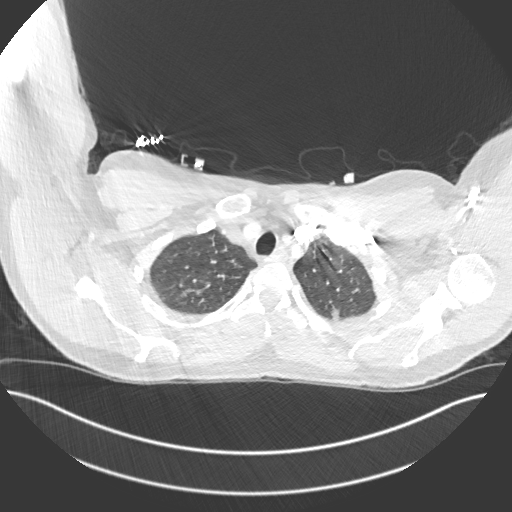
[im 292/336  soft-tissue]
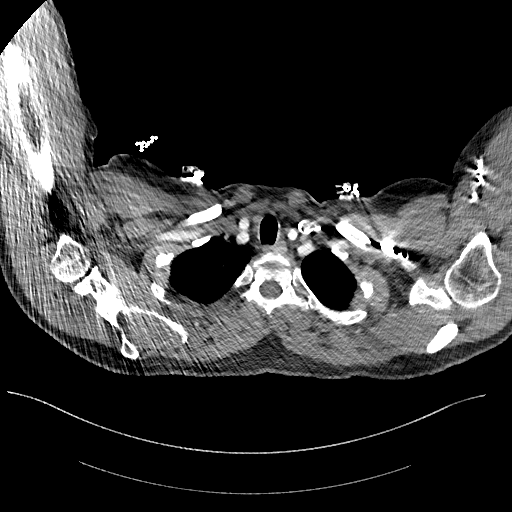
[im 321/336  lung]
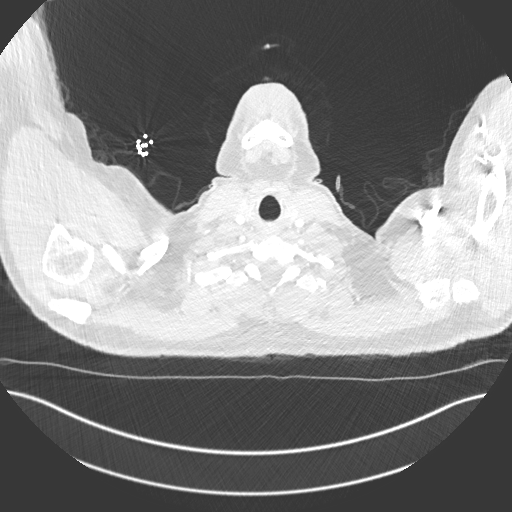

[Series 8: cor soft · coronal · 0.69mm/px · 3 of 143 slices shown]
[im 36/143  soft-tissue]
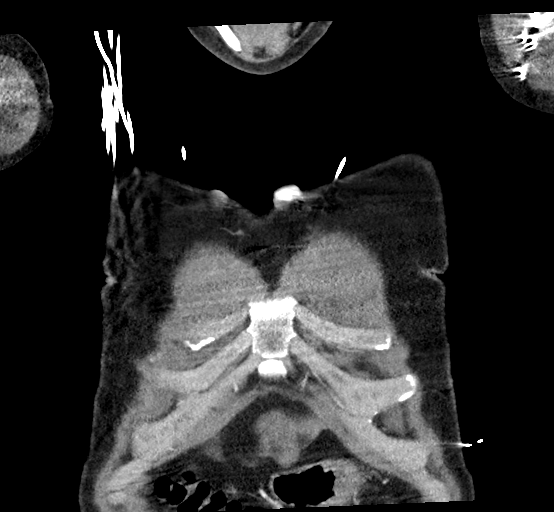
[im 72/143  soft-tissue]
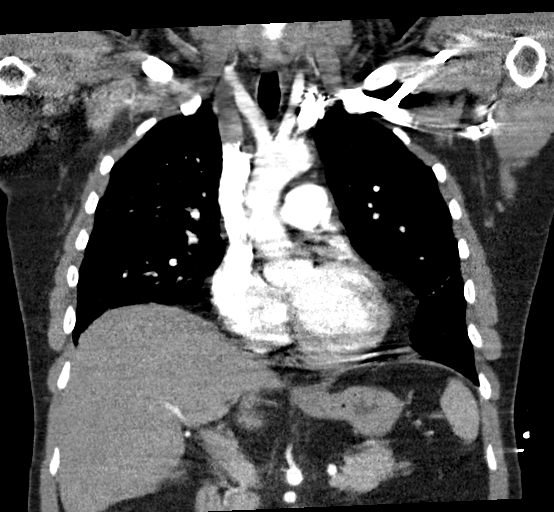
[im 107/143  soft-tissue]
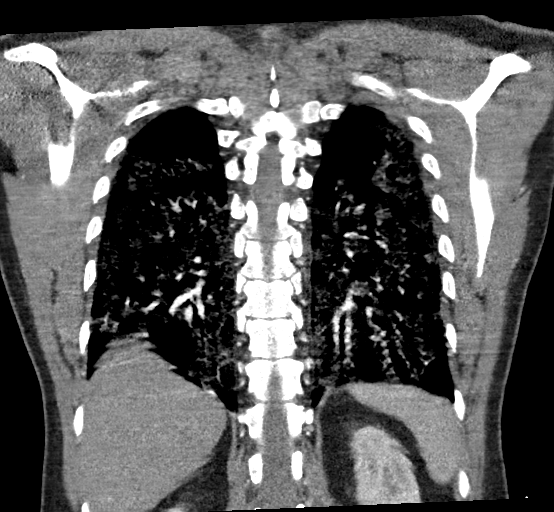

[18 of 46 positions shown; findings below may reference images not displayed]

FINDINGS: Evaluation of this exam is limited due to respiratory motion
artifact.

Cardiovascular: There is no cardiomegaly or pericardial effusion.
The thoracic aorta is unremarkable. Evaluation of the pulmonary
arteries is very limited due to severe respiratory motion artifact.
No large or central pulmonary artery embolus identified.

Mediastinum/Nodes: Mildly enlarged right hilar lymph node measuring
13 mm in short axis. No mediastinal adenopathy. The esophagus and
the thyroid gland are grossly unremarkable. No mediastinal fluid
collection.

Lungs/Pleura: Bilateral patchy ground-glass opacities with
peripheral and subpleural distribution in keeping with COVID
pneumonia. There is no pleural effusion pneumothorax. The central
airways are patent.

Upper Abdomen: Indeterminate ill-defined 3.4 x 2.8 cm left liver
hypodense lesion, likely a hemangioma. Further characterization with
MRI without and with contrast on a nonemergent/outpatient basis
recommended.

Musculoskeletal: No chest wall abnormality. No acute or significant
osseous findings.

Review of the MIP images confirms the above findings.
IMPRESSION: 1. No CT evidence of central pulmonary artery embolus.
2. Bilateral patchy ground-glass opacities in keeping with COVID
pneumonia. Clinical correlation and follow-up to resolution
recommended.
3. Mildly enlarged right hilar lymph node, likely reactive.
4. Indeterminate ill-defined left liver hypodense lesion, likely a
hemangioma. Further characterization with MRI without and with
contrast on a nonemergent/outpatient basis recommended.

## 2021-08-07 IMAGING — DX DG CHEST 1V PORT
1 series · 1 of 1 positions shown · non-contrast
Comparison: 07/07/2020

CLINICAL DATA: Fever, COVID pneumonia

EXAM:
PORTABLE CHEST 1 VIEW

[chest ap]
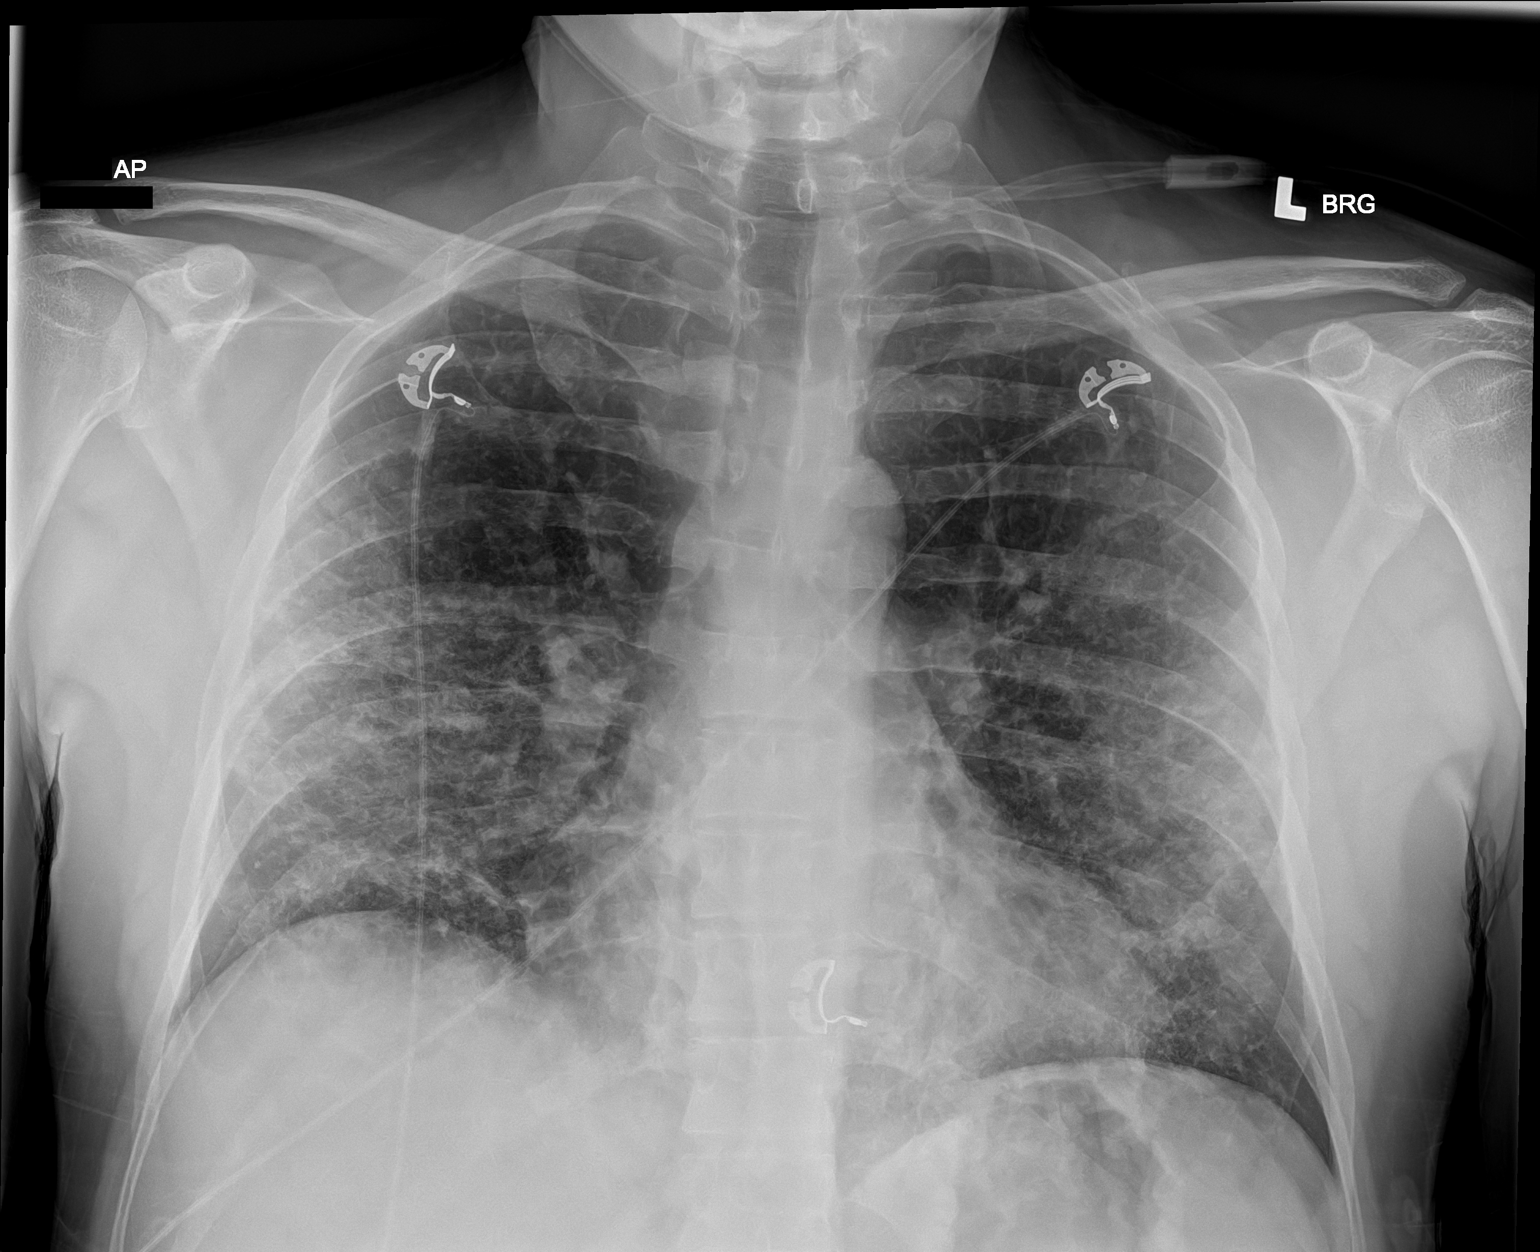

[1 of 1 positions shown; findings below may reference images not displayed]

FINDINGS: The lungs are well expanded and are symmetric though pulmonary
insufflation has slightly diminished since prior examination.
Extensive mid and lower lung zone airspace infiltrates are
identified and have progressed since prior examination in keeping
with changes of atypical infection. No pneumothorax or pleural
effusion. Cardiac size within normal limits. Pulmonary vascularity
is normal. No acute bone abnormality.
IMPRESSION: Interval worsening of bilateral mid and lower lung zone airspace
infiltrates in keeping with atypical infection.

## 2021-09-16 ENCOUNTER — Other Ambulatory Visit: Payer: Self-pay

## 2021-09-16 ENCOUNTER — Ambulatory Visit (INDEPENDENT_AMBULATORY_CARE_PROVIDER_SITE_OTHER): Payer: No Typology Code available for payment source | Admitting: *Deleted

## 2021-09-16 DIAGNOSIS — Z23 Encounter for immunization: Secondary | ICD-10-CM | POA: Diagnosis not present

## 2021-09-21 ENCOUNTER — Encounter: Payer: Self-pay | Admitting: Family Medicine

## 2021-09-22 MED ORDER — METOPROLOL TARTRATE 25 MG PO TABS: ORAL_TABLET | ORAL | 3 refills | Status: DC

## 2021-09-30 ENCOUNTER — Encounter: Payer: Self-pay | Admitting: Family Medicine

## 2021-10-03 ENCOUNTER — Encounter: Payer: Self-pay | Admitting: Nurse Practitioner

## 2021-10-03 ENCOUNTER — Other Ambulatory Visit: Payer: Self-pay

## 2021-10-03 ENCOUNTER — Telehealth (INDEPENDENT_AMBULATORY_CARE_PROVIDER_SITE_OTHER): Payer: No Typology Code available for payment source | Admitting: Nurse Practitioner

## 2021-10-03 DIAGNOSIS — E7800 Pure hypercholesterolemia, unspecified: Secondary | ICD-10-CM

## 2021-10-03 DIAGNOSIS — J069 Acute upper respiratory infection, unspecified: Secondary | ICD-10-CM

## 2021-10-03 MED ORDER — PREDNISONE 20 MG PO TABS: 40.0000 mg | ORAL_TABLET | Freq: Every day | ORAL | 0 refills | Status: AC

## 2021-10-03 MED ORDER — BENZONATATE 100 MG PO CAPS: 200.0000 mg | ORAL_CAPSULE | Freq: Three times a day (TID) | ORAL | 0 refills | Status: DC | PRN

## 2021-10-03 MED ORDER — ATORVASTATIN CALCIUM 20 MG PO TABS: 20.0000 mg | ORAL_TABLET | Freq: Every day | ORAL | 3 refills | Status: DC

## 2021-10-03 NOTE — Progress Notes (Signed)
Subjective:    Patient ID: Kent Hernandez, male    DOB: Aug 21, 1986, 35 y.o.   MRN: [id number]  HPI: Kent Hernandez is a 35 y.o. male presenting for cough and congestion.  Chief Complaint  Patient presents with   URI   Cough    UPPER RESPIRATORY TRACT INFECTION Had double pneumonia with COVID last year. Onset: Tuesday  COVID-19 testing history: COVID-19 vaccination status: Fever: no Body aches: no Chills: no Cough:  yes Shortness of breath: no Wheezing: no Chest pain: yes; with cough Chest tightness: no Chest congestion: yes Nasal congestion: no Runny nose: no Post nasal drip: yes Sneezing: no Sore throat: no Swollen glands: no Sinus pressure: no Headache: no Face pain: no Toothache: no Ear pain: no Ear pressure:  yes; fullness   Eyes red/itching:no Eye drainage/crusting: no  Nausea: no  Vomiting: no Diarrhea:  yes; stable   Change in appetite: no  Loss of taste/smell: no  Rash: no Fatigue: yes Sick contacts: no Strep contacts: no  Context: better but not all the way Recurrent sinusitis: no Treatments attempted: Mucinex, flonase and zrytec, Benadryl and ibuprofen  Relief with OTC medications: yes  Also requesting refill of atorvastatin 20 mg daily today.  No Known Allergies  Outpatient Encounter Medications as of 10/03/2021  Medication Sig   benzonatate (TESSALON) 100 MG capsule Take 2 capsules (200 mg total) by mouth 3 (three) times daily as needed for cough. Take first dose at night time and monitor for drowsiness.  If this medication makes you drowsy, do not take while driving or operating heavy machinery.   predniSONE (DELTASONE) 20 MG tablet Take 2 tablets (40 mg total) by mouth daily with breakfast for 5 days.   albuterol (VENTOLIN HFA) 108 (90 Base) MCG/ACT inhaler Inhale 2 puffs into the lungs every 6 (six) hours as needed for wheezing or shortness of breath.   ALPRAZolam (XANAX) 0.5 MG tablet Take 1 tablet (0.5 mg total) by mouth 3 (three)  times daily as needed.   ascorbic acid (VITAMIN C) 500 MG tablet Take 1 tablet (500 mg total) by mouth daily.   atorvastatin (LIPITOR) 20 MG tablet Take 1 tablet (20 mg total) by mouth daily.   cetirizine (ZYRTEC) 10 MG tablet Take 10 mg by mouth daily.   cyclobenzaprine (FLEXERIL) 10 MG tablet Take 1 tablet (10 mg total) by mouth 3 (three) times daily as needed for muscle spasms.   fluticasone (FLONASE) 50 MCG/ACT nasal spray Place 1 spray into both nostrils daily.   lansoprazole (PREVACID) 30 MG capsule Take 30 mg by mouth daily at 12 noon.   metoprolol tartrate (LOPRESSOR) 25 MG tablet TAKE 0.5 TABLETS BY MOUTH 2 TIMES DAILY.   [DISCONTINUED] atorvastatin (LIPITOR) 20 MG tablet Take 1 tablet (20 mg total) by mouth daily.   No facility-administered encounter medications on file as of 10/03/2021.    Patient Active Problem List   Diagnosis Date Noted   Palpitations 04/12/2021   Fatty liver disease, nonalcoholic    Transaminitis 07/11/2020   Hypercalcemia 07/11/2020   Pneumonia due to COVID-19 virus 07/10/2020   Hyperlipidemia    Hypertension    DDD (degenerative disc disease), cervical 08/09/2016   Skin lesion 08/04/2013   Allergic rhinitis    GERD (gastroesophageal reflux disease) 07/11/2011   Dysphagia, unspecified(787.20) 06/12/2011   Esophageal reflux 06/12/2011    Past Medical History:  Diagnosis Date   Allergic rhinitis    DDD (degenerative disc disease), cervical 2015   Esophageal reflux  Fatty liver disease, nonalcoholic    GERD (gastroesophageal reflux disease)    [facility] 04/12/2020   H. pylori infection    Hyperlipidemia    [facility] 04/12/2020   Hypertension    [facility] 04/12/2020   Liver hemangioma     Relevant past medical, surgical, family and social history reviewed and updated as indicated. Interim medical history since our last visit reviewed.  Review of Systems Per HPI unless specifically indicated above     Objective:    There were no vitals  taken for this visit.  Wt Readings from Last 3 Encounters:  06/06/21 183 lb (83 kg)  04/12/21 182 lb (82.6 kg)  02/21/21 180 lb (81.6 kg)    Physical Exam Vitals and nursing note reviewed.  Constitutional:      General: He is not in acute distress.    Appearance: Normal appearance. He is not toxic-appearing.  HENT:     Head: Normocephalic and atraumatic.     Right Ear: External ear normal.     Left Ear: External ear normal.     Nose: Congestion present. No rhinorrhea.     Mouth/Throat:     Mouth: Mucous membranes are moist.     Pharynx: Oropharynx is clear.  Eyes:     General: No scleral icterus.    Extraocular Movements: Extraocular movements intact.  Cardiovascular:     Comments: Unable to assess heart sounds via virtual visit. Pulmonary:     Effort: Pulmonary effort is normal. No respiratory distress.     Comments: Unable to assess breath sounds via virtual visit.  Patient talking in complete sentences during telemedicine visit.  No accessory muscle use. Skin:    Coloration: Skin is not jaundiced or pale.     Findings: No erythema.  Neurological:     Mental Status: He is alert and oriented to person, place, and time.  Psychiatric:        Mood and Affect: Mood normal.        Behavior: Behavior normal.        Thought Content: Thought content normal.        Judgment: Judgment normal.      Assessment & Plan:  1. Upper respiratory tract infection, unspecified type Acute x 6 days.  Improving, likely viral in etiology.  Unlikely still contagious.  Reassured patient that symptoms and exam findings are most consistent with a viral upper respiratory infection and explained lack of efficacy of antibiotics against viruses.  Discussed expected course and features suggestive of secondary bacterial infection.  Continue supportive care. Increase fluid intake with water or electrolyte solution like pedialyte. Encouraged acetaminophen as needed for fever/pain. Encouraged salt water  gargling, chloraseptic spray and throat lozenges. Encouraged OTC guaifenesin. Encouraged saline sinus flushes and/or neti with humidified air.  Start prednisone for possible wheezing/reactive airway disease.  Start Tessalon perles for cough.  Follow up with no improvement later this week or if symptoms worsen.  - benzonatate (TESSALON) 100 MG capsule; Take 2 capsules (200 mg total) by mouth 3 (three) times daily as needed for cough. Take first dose at night time and monitor for drowsiness.  If this medication makes you drowsy, do not take while driving or operating heavy machinery.  Dispense: 30 capsule; Refill: 0 - predniSONE (DELTASONE) 20 MG tablet; Take 2 tablets (40 mg total) by mouth daily with breakfast for 5 days.  Dispense: 10 tablet; Refill: 0  2. Elevated cholesterol Requested refill; most recent labs reviewed and refill sent into pharmacy.  -  atorvastatin (LIPITOR) 20 MG tablet; Take 1 tablet (20 mg total) by mouth daily.  Dispense: 90 tablet; Refill: 3   Follow up plan: Return if symptoms worsen or fail to improve.   Due to the catastrophic nature of the COVID-19 pandemic, this video visit was completed soley via audio and visual contact via [facility] due to the restrictions of the COVID-19 pandemic.  All issues as above were discussed and addressed. Physical exam was done as above through visual confirmation on [employer]. If it was felt that the patient should be evaluated in the office, they were directed there. The patient verbally consented to this visit. Location of the patient: home Location of the provider: work Those involved with this call:  Provider: Noemi Chapel, DNP, FNP-C CMA: n/a Front Desk/Registration: Santina Evans  Time spent on call:  13 minutes with patient face to face via video conference. More than 50% of this time was spent in counseling and coordination of care. 17 minutes total spent in review of patient's record and preparation of their chart. I  verified patient identity using two factors (patient name and date of birth). Patient consents verbally to being seen via telemedicine visit today.

## 2021-11-29 ENCOUNTER — Other Ambulatory Visit: Payer: Self-pay | Admitting: Family Medicine

## 2021-11-30 NOTE — Telephone Encounter (Signed)
Xanax refill request.  Last seen 10/03/2021, last filled 02/21/2021.

## 2021-12-01 MED ORDER — ALPRAZOLAM 0.5 MG PO TABS: 0.5000 mg | ORAL_TABLET | Freq: Three times a day (TID) | ORAL | 0 refills | Status: DC | PRN

## 2021-12-22 ENCOUNTER — Ambulatory Visit: Payer: No Typology Code available for payment source | Admitting: Family Medicine

## 2021-12-22 ENCOUNTER — Other Ambulatory Visit: Payer: Self-pay

## 2021-12-22 ENCOUNTER — Encounter: Payer: Self-pay | Admitting: Family Medicine

## 2021-12-22 VITALS — BP 122/78 | HR 74 | Temp 97.3°F | Resp 18 | Wt 177.0 lb

## 2021-12-22 DIAGNOSIS — R5383 Other fatigue: Secondary | ICD-10-CM

## 2021-12-22 DIAGNOSIS — R002 Palpitations: Secondary | ICD-10-CM

## 2021-12-22 NOTE — Progress Notes (Signed)
Subjective:    Patient ID: Kent Hernandez, male    DOB: 1986/10/31, 36 y.o.   MRN: [id number]  Patient is a very pleasant 36 year old Caucasian gentleman who presents today finding of persistent fatigue as well as palpitations that he tends to notice only at night.  He is seeing cardiology regarding the palpitations.  They did an echocardiogram last year that was normal with no structural abnormalities of the heart.  He had a cardiac monitor to confirm PVCs.  These tend to occur primarily at night.  He does not notice them as much during the day.  He denies any syncope or near syncope.  He also continues to have fatigue.  He denies any fevers or chills or night sweats.  He denies any bruising or bleeding.  He denies any melena or hematochezia.  He denies any unintentional weight loss.  Last year we checked a testosterone level that was borderline low which I believe is clinically low for a man his age.  However he has not tried an adequate course of testosterone replacement to see if it is beneficial.  At that time TSH was normal.  We have not checked a cortisol level or B12. We have not checked a sleep study. Past Medical History:  Diagnosis Date   Allergic rhinitis    DDD (degenerative disc disease), cervical 2015   Esophageal reflux    Fatty liver disease, nonalcoholic    GERD (gastroesophageal reflux disease)    Phreesia 04/12/2020   H. pylori infection    Hyperlipidemia    [facility]reesia 04/12/2020   Hypertension    [facility]reesia 04/12/2020   Liver hemangioma    History reviewed. No pertinent surgical history. Current Outpatient Medications on File Prior to Visit  Medication Sig Dispense Refill   albuterol (VENTOLIN HFA) 108 (90 Base) MCG/ACT inhaler Inhale 2 puffs into the lungs every 6 (six) hours as needed for wheezing or shortness of breath. 1 each 0   ALPRAZolam (XANAX) 0.5 MG tablet Take 1 tablet (0.5 mg total) by mouth 3 (three) times daily as needed. 30 tablet 0   ascorbic acid (VITAMIN  C) 500 MG tablet Take 1 tablet (500 mg total) by mouth daily.     atorvastatin (LIPITOR) 20 MG tablet Take 1 tablet (20 mg total) by mouth daily. 90 tablet 3   cetirizine (ZYRTEC) 10 MG tablet Take 10 mg by mouth daily.     cyclobenzaprine (FLEXERIL) 10 MG tablet Take 1 tablet (10 mg total) by mouth 3 (three) times daily as needed for muscle spasms. 30 tablet 0   fluticasone (FLONASE) 50 MCG/ACT nasal spray Place 1 spray into both nostrils daily.     lansoprazole (PREVACID) 30 MG capsule Take 30 mg by mouth daily at 12 noon.     metoprolol tartrate (LOPRESSOR) 25 MG tablet TAKE 0.5 TABLETS BY MOUTH 2 TIMES DAILY. 90 tablet 3   No current facility-administered medications on file prior to visit.   No Known Allergies Social History   Socioeconomic History   Marital status: Married    Spouse name: Not on file   Number of children: Not on file   Years of education: Not on file   Highest education level: Not on file  Occupational History   Occupation: Dance movement psychotherapist    Employer: [employer]  Tobacco Use   Smoking status: Former    Types: Cigarettes   Smokeless tobacco: Former    Quit date: 02/12/2011   Tobacco comments:    Light  use  Substance and Sexual Activity   Alcohol use: Yes    Comment: occ   Drug use: No   Sexual activity: Yes  Other Topics Concern   Not on file  Social History Narrative   Not on file   Social Determinants of Health   Financial Resource Strain: Not on file  Food Insecurity: Not on file  Transportation Needs: Not on file  Physical Activity: Not on file  Stress: Not on file  Social Connections: Not on file  Intimate Partner Violence: Not on file      Review of Systems  Cardiovascular:  Positive for palpitations.  All other systems reviewed and are negative.     Objective:   Physical Exam Vitals reviewed.  Constitutional:      Appearance: Normal appearance. He is normal weight.  Eyes:     Conjunctiva/sclera: Conjunctivae normal.   Neck:     Thyroid: No thyroid mass, thyromegaly or thyroid tenderness.  Cardiovascular:     Rate and Rhythm: Normal rate and regular rhythm.     Pulses: Normal pulses.     Heart sounds: Normal heart sounds. No murmur heard.   No friction rub. No gallop.  Pulmonary:     Effort: Pulmonary effort is normal. No respiratory distress.     Breath sounds: Normal breath sounds. No stridor. No wheezing, rhonchi or rales.  Abdominal:     General: Abdomen is flat. Bowel sounds are normal. There is no distension.     Palpations: Abdomen is soft. There is no mass.     Tenderness: There is no abdominal tenderness. There is no guarding or rebound.     Hernia: No hernia is present.  Musculoskeletal:     Right lower leg: No edema.     Left lower leg: No edema.  Neurological:     General: No focal deficit present.     Mental Status: He is alert and oriented to person, place, and time. Mental status is at baseline.     Cranial Nerves: No cranial nerve deficit.     Sensory: No sensory deficit.     Motor: No weakness.     Coordination: Coordination normal.     Gait: Gait normal.     Deep Tendon Reflexes: Reflexes normal.          Assessment & Plan:  Fatigue, unspecified type - Plan: CBC with Differential/Platelet, Vitamin B12, TSH, COMPLETE METABOLIC PANEL WITH GFR, Testosterone Total,Free,Bio, Males, Cortisol, free, Serum  Palpitations I believe the patient's palpitations are PVCs.  He primarily notices them at night.  I believe this is likely due to lack of distraction at night.  The other possibility would be obstructive sleep apnea causing hypoxia triggering PVCs.  We discussed a sleep study.  He defers that at the present time.  I will check a CBC, B12, TSH, CMP, testosterone level.  If labs are normal except for borderline low testosterone and I would recommend trying testosterone replacement for 3 months.  The other possibility would be ordering a sleep study.  Patient will consider the sleep  study but at the present time he declines that.

## 2021-12-23 ENCOUNTER — Encounter: Payer: Self-pay | Admitting: Family Medicine

## 2021-12-23 NOTE — Telephone Encounter (Signed)
Pt would like to start testosterone injections at home.  Please send in rx for med and supplies. Thank you!

## 2021-12-26 ENCOUNTER — Other Ambulatory Visit: Payer: Self-pay | Admitting: Family Medicine

## 2021-12-26 MED ORDER — TESTOSTERONE 1.62 % TD GEL: 2.0000 | Freq: Every day | TRANSDERMAL | 3 refills | Status: DC

## 2021-12-28 LAB — COMPLETE METABOLIC PANEL WITH GFR
AG Ratio: 1.9 (calc) (ref 1.0–2.5)
ALT: 23 U/L (ref 9–46)
AST: 21 U/L (ref 10–40)
Albumin: 4.5 g/dL (ref 3.6–5.1)
Alkaline phosphatase (APISO): 81 U/L (ref 36–130)
BUN: 18 mg/dL (ref 7–25)
CO2: 25 mmol/L (ref 20–32)
Calcium: 9.9 mg/dL (ref 8.6–10.3)
Chloride: 103 mmol/L (ref 98–110)
Creat: 1.04 mg/dL (ref 0.60–1.26)
Globulin: 2.4 g/dL (calc) (ref 1.9–3.7)
Glucose, Bld: 116 mg/dL — ABNORMAL HIGH (ref 65–99)
Potassium: 4 mmol/L (ref 3.5–5.3)
Sodium: 139 mmol/L (ref 135–146)
Total Bilirubin: 1.9 mg/dL — ABNORMAL HIGH (ref 0.2–1.2)
Total Protein: 6.9 g/dL (ref 6.1–8.1)
eGFR: 95 mL/min/{1.73_m2} (ref >=60)

## 2021-12-28 LAB — TESTOSTERONE TOTAL,FREE,BIO, MALES
Albumin: 4.5 g/dL (ref 3.6–5.1)
Sex Hormone Binding: 29 nmol/L (ref 10–50)
Testosterone, Bioavailable: 98.3 ng/dL — ABNORMAL LOW (ref 110.0–575.0)
Testosterone, Free: 47.8 pg/mL (ref 46.0–224.0)
Testosterone: 336 ng/dL (ref 250–827)

## 2021-12-28 LAB — CBC WITH DIFFERENTIAL/PLATELET
Absolute Monocytes: 504 cells/uL (ref 200–950)
Basophils Absolute: 50 cells/uL (ref 0–200)
Basophils Relative: 0.7 %
Eosinophils Absolute: 259 cells/uL (ref 15–500)
Eosinophils Relative: 3.6 %
HCT: 48 % (ref 38.5–50.0)
Hemoglobin: 16.2 g/dL (ref 13.2–17.1)
Lymphs Abs: 1692 cells/uL (ref 850–3900)
MCH: 30.1 pg (ref 27.0–33.0)
MCHC: 33.8 g/dL (ref 32.0–36.0)
MCV: 89.1 fL (ref 80.0–100.0)
MPV: 10.9 fL (ref 7.5–12.5)
Monocytes Relative: 7 %
Neutro Abs: 4694 cells/uL (ref 1500–7800)
Neutrophils Relative %: 65.2 %
Platelets: 305 10*3/uL (ref 140–400)
RBC: 5.39 10*6/uL (ref 4.20–5.80)
RDW: 12 % (ref 11.0–15.0)
Total Lymphocyte: 23.5 %
WBC: 7.2 10*3/uL (ref 3.8–10.8)

## 2021-12-28 LAB — TSH: TSH: 2.82 mIU/L (ref 0.40–4.50)

## 2021-12-28 LAB — VITAMIN B12: Vitamin B-12: 384 pg/mL (ref 200–1100)

## 2021-12-28 LAB — CORTISOL, FREE: Cortisol Free, Ser: 0.22 ug/dL

## 2021-12-30 ENCOUNTER — Telehealth: Payer: Self-pay

## 2021-12-30 NOTE — Telephone Encounter (Signed)
PA started via [facility] for Testosterone Key: [id number]

## 2022-01-05 NOTE — Telephone Encounter (Signed)
Approved on February 22 Your PA request has been approved.

## 2022-01-20 ENCOUNTER — Encounter: Payer: Self-pay | Admitting: Family Medicine

## 2022-01-20 ENCOUNTER — Other Ambulatory Visit: Payer: Self-pay

## 2022-01-20 ENCOUNTER — Ambulatory Visit: Payer: No Typology Code available for payment source | Admitting: Family Medicine

## 2022-01-20 VITALS — BP 118/92 | HR 76 | Temp 97.0°F | Resp 18 | Ht 69.0 in | Wt 177.0 lb

## 2022-01-20 DIAGNOSIS — R0789 Other chest pain: Secondary | ICD-10-CM | POA: Diagnosis not present

## 2022-01-20 MED ORDER — ESCITALOPRAM OXALATE 10 MG PO TABS: 10.0000 mg | ORAL_TABLET | Freq: Every day | ORAL | 3 refills | Status: DC

## 2022-01-20 NOTE — Progress Notes (Signed)
? ?Subjective:  ? ? Patient ID: Kent Hernandez, male    DOB: [birth year], 36 y.o.   MRN: [id number] ? ?Chest Pain  ?Associated symptoms include a cough.  ?Extremity Weakness  ? ?Cough ?Associated symptoms include chest pain.   ? ?Patient's fatigue has improved ever since he started testosterone.  However recently he developed pain in the left side of his chest near his nipple.  It was a sharp pain.  Lasted several minutes.  There was no shortness of breath associated with it.  However he reports worsening palpitations.  He is extremely anxious today and tearful on exam.  He states he cannot take it anymore.  He feels like there is something wrong.  He is getting episodic chest pain.  He is concerned that it could be his heart or that he could be having angina.  I believe the patient is dealing with uncontrolled anxiety and panic attacks.  He reports that his heart rate will suddenly rise to 130 simply walking his dog.  However he states that he can run to his sister's house and job with no problems. ?Past Medical History:  ?Diagnosis Date  ? Allergic rhinitis   ? DDD (degenerative disc disease), cervical 2015  ? Esophageal reflux   ? Fatty liver disease, nonalcoholic   ? GERD (gastroesophageal reflux disease)   ? Phreesia 04/12/2020  ? H. pylori infection   ? Hyperlipidemia   ? Phreesia 04/12/2020  ? Hypertension   ? Phreesia 04/12/2020  ? Liver hemangioma   ? ?History reviewed. No pertinent surgical history. ?Current Outpatient Medications on File Prior to Visit  ?Medication Sig Dispense Refill  ? albuterol (VENTOLIN HFA) 108 (90 Base) MCG/ACT inhaler Inhale 2 puffs into the lungs every 6 (six) hours as needed for wheezing or shortness of breath. 1 each 0  ? ALPRAZolam (XANAX) 0.5 MG tablet Take 1 tablet (0.5 mg total) by mouth 3 (three) times daily as needed. 30 tablet 0  ? ascorbic acid (VITAMIN C) 500 MG tablet Take 1 tablet (500 mg total) by mouth daily.    ? atorvastatin (LIPITOR) 20 MG tablet Take 1 tablet (20  mg total) by mouth daily. 90 tablet 3  ? cetirizine (ZYRTEC) 10 MG tablet Take 10 mg by mouth daily.    ? cyclobenzaprine (FLEXERIL) 10 MG tablet Take 1 tablet (10 mg total) by mouth 3 (three) times daily as needed for muscle spasms. 30 tablet 0  ? fluticasone (FLONASE) 50 MCG/ACT nasal spray Place 1 spray into both nostrils daily.    ? lansoprazole (PREVACID) 30 MG capsule Take 30 mg by mouth daily at 12 noon.    ? metoprolol tartrate (LOPRESSOR) 25 MG tablet TAKE 0.5 TABLETS BY MOUTH 2 TIMES DAILY. 90 tablet 3  ? Testosterone 1.62 % GEL Place 2 Pump onto the skin daily. 75 g 3  ? ?No current facility-administered medications on file prior to visit.  ? ?No Known Allergies ?Social History  ? ?Socioeconomic History  ? Marital status: Married  ?  Spouse name: Not on file  ? Number of children: Not on file  ? Years of education: Not on file  ? Highest education level: Not on file  ?Occupational History  ? Occupation: Dance movement psychotherapist  ?  Employer: [employer]  ?Tobacco Use  ? Smoking status: Former  ?  Types: Cigarettes  ? Smokeless tobacco: Former  ?  Quit date: 02/12/2011  ? Tobacco comments:  ?  Light use  ?Substance and Sexual Activity  ?  Alcohol use: Yes  ?  Comment: occ  ? Drug use: No  ? Sexual activity: Yes  ?Other Topics Concern  ? Not on file  ?Social History Narrative  ? Not on file  ? ?Social Determinants of Health  ? ?Financial Resource Strain: Not on file  ?Food Insecurity: Not on file  ?Transportation Needs: Not on file  ?Physical Activity: Not on file  ?Stress: Not on file  ?Social Connections: Not on file  ?Intimate Partner Violence: Not on file  ? ? ? ? ?Review of Systems  ?Respiratory:  Positive for cough.   ?Cardiovascular:  Positive for chest pain.  ?Musculoskeletal:  Positive for extremity weakness.  ?All other systems reviewed and are negative. ? ?   ?Objective:  ? Physical Exam ?Vitals reviewed.  ?Constitutional:   ?   Appearance: Normal appearance. He is normal weight.  ?Eyes:  ?    Conjunctiva/sclera: Conjunctivae normal.  ?Neck:  ?   Thyroid: No thyroid mass, thyromegaly or thyroid tenderness.  ?Cardiovascular:  ?   Rate and Rhythm: Normal rate and regular rhythm.  ?   Pulses: Normal pulses.  ?   Heart sounds: Normal heart sounds. No murmur heard. ?  No friction rub. No gallop.  ?Pulmonary:  ?   Effort: Pulmonary effort is normal. No respiratory distress.  ?   Breath sounds: Normal breath sounds. No stridor. No wheezing, rhonchi or rales.  ?Abdominal:  ?   General: Abdomen is flat. Bowel sounds are normal. There is no distension.  ?   Palpations: Abdomen is soft. There is no mass.  ?   Tenderness: There is no abdominal tenderness. There is no guarding or rebound.  ?   Hernia: No hernia is present.  ?Musculoskeletal:  ?   Right lower leg: No edema.  ?   Left lower leg: No edema.  ?Neurological:  ?   General: No focal deficit present.  ?   Mental Status: He is alert and oriented to person, place, and time. Mental status is at baseline.  ?   Cranial Nerves: No cranial nerve deficit.  ?   Sensory: No sensory deficit.  ?   Motor: No weakness.  ?   Coordination: Coordination normal.  ?   Gait: Gait normal.  ?   Deep Tendon Reflexes: Reflexes normal.  ? ? ? ? ? ?   ?Assessment & Plan:  ?Other chest pain - Plan: CT CARDIAC SCORING (SELF PAY ONLY), D-dimer, quantitative ?We had a long discussion about this in the past.  I truly feel that the patient has panic disorder/generalized anxiety disorder and that the anxiety is likely increasing his adrenaline production triggering the palpitations or at least making them worse.  I believe the pain in his chest was likely muscle spasms in his chest.  I will start the patient on Lexapro 10 mg a day to try to help better manage his anxiety.  Meanwhile I will schedule patient for CT cardiac scoring.  Hopefully I can prove to the patient that there are no blockages in his chest to assuage his fears.  I will also check a D-dimer given the fact he is on  testosterone although I believe the chance of pulmonary embolism is highly unlikely ?

## 2022-01-21 LAB — D-DIMER, QUANTITATIVE: D-Dimer, Quant: <0.19 mcg/mL FEU (ref <0.50)

## 2022-01-26 ENCOUNTER — Encounter: Payer: Self-pay | Admitting: Family Medicine

## 2022-01-30 ENCOUNTER — Other Ambulatory Visit: Payer: Self-pay | Admitting: Family Medicine

## 2022-01-30 NOTE — Telephone Encounter (Signed)
If patient switches to injections we will have to create a new prior auth. ? ?FYI, thanks! ?

## 2022-01-31 ENCOUNTER — Other Ambulatory Visit: Payer: Self-pay | Admitting: Family Medicine

## 2022-01-31 MED ORDER — TESTOSTERONE CYPIONATE 200 MG/ML IM SOLN: 200.0000 mg | INTRAMUSCULAR | 0 refills | Status: DC

## 2022-02-02 ENCOUNTER — Ambulatory Visit (HOSPITAL_COMMUNITY)
Admission: RE | Admit: 2022-02-02 | Discharge: 2022-02-02 | Disposition: A | Discharge status: Home / Self Care | Payer: Self-pay | Source: Ambulatory Visit | Attending: Family Medicine | Admitting: Family Medicine

## 2022-02-02 ENCOUNTER — Other Ambulatory Visit: Payer: Self-pay

## 2022-02-02 DIAGNOSIS — R0789 Other chest pain: Secondary | ICD-10-CM | POA: Insufficient documentation

## 2022-02-15 ENCOUNTER — Other Ambulatory Visit: Payer: Self-pay | Admitting: Family Medicine

## 2022-02-21 ENCOUNTER — Telehealth: Payer: Self-pay | Admitting: Family Medicine

## 2022-02-21 NOTE — Telephone Encounter (Signed)
We typically use 25x1 needles. We have not sent in any for patient so not sure where his wife got them from without a prescription. ? ?[place] ?

## 2022-02-21 NOTE — Telephone Encounter (Signed)
Patient called asking what size needles do we use in office. He states that he thinks his wife picked up wrong size for his insulin and it hurt. ? ?CB# [phone] ?

## 2022-03-10 NOTE — Telephone Encounter (Signed)
Spoke with patient and advised. He got 23G and 25G from [place] and is now using those. Reports his wife had gotten insulin needles and these were very painful. Also advised him to alternate locations of administration as he had not been doing this. Patient voiced understanding. Nothing further needed at this time.  ? ?

## 2022-03-14 ENCOUNTER — Encounter: Payer: Self-pay | Admitting: Family Medicine

## 2022-03-15 MED ORDER — METOPROLOL TARTRATE 25 MG PO TABS: ORAL_TABLET | ORAL | 3 refills | Status: DC

## 2022-04-11 ENCOUNTER — Encounter: Payer: Self-pay | Admitting: Family Medicine

## 2022-04-14 ENCOUNTER — Ambulatory Visit: Payer: No Typology Code available for payment source | Admitting: Family Medicine

## 2022-04-14 VITALS — BP 116/82 | HR 68 | Temp 98.0°F | Ht 69.0 in | Wt 183.4 lb

## 2022-04-14 DIAGNOSIS — Z0000 Encounter for general adult medical examination without abnormal findings: Secondary | ICD-10-CM

## 2022-04-14 DIAGNOSIS — E291 Testicular hypofunction: Secondary | ICD-10-CM | POA: Diagnosis not present

## 2022-04-14 DIAGNOSIS — R17 Unspecified jaundice: Secondary | ICD-10-CM

## 2022-04-14 NOTE — Progress Notes (Signed)
Subjective:    Patient ID: Kent Hernandez, male    DOB: 12-Jul-1986, 36 y.o.   MRN: [id number] Patient is a very pleasant 36 year old Caucasian male here today for complete physical exam.  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  Recently he has been having tinnitus and occasional bouts of dizziness but not vertigo.  I suspect the dizziness is due to relative hypotension from the metoprolol.  However Mnire's disease is on the differential diagnosis.  He would like to come off the metoprolol which I would agree with.  He states that he is feeling better since he started taking testosterone.  He has had occasional pain in his right lower quadrant however today's exam is benign.  Otherwise he is doing well with no concerns. Past Medical History:  Diagnosis Date   Allergic rhinitis    DDD (degenerative disc disease), cervical 2015   Esophageal reflux    Fatty liver disease, nonalcoholic    GERD (gastroesophageal reflux disease)    Phreesia 04/12/2020   H. pylori infection    Hyperlipidemia    Phreesia 04/12/2020   Hypertension    Phreesia 04/12/2020   Liver hemangioma    No past surgical history on file. Current Outpatient Medications on File Prior to Visit  Medication Sig Dispense Refill   albuterol (VENTOLIN HFA) 108 (90 Base) MCG/ACT inhaler Inhale 2 puffs into the lungs every 6 (six) hours as needed for wheezing or shortness of breath. 1 each 0   ALPRAZolam (XANAX) 0.5 MG tablet Take 1 tablet (0.5 mg total) by mouth 3 (three) times daily as needed. 30 tablet 0   ascorbic acid (VITAMIN C) 500 MG tablet Take 1 tablet (500 mg total) by mouth daily.     atorvastatin (LIPITOR) 20 MG tablet Take 1 tablet (20 mg total) by mouth daily. 90 tablet 3   cetirizine (ZYRTEC) 10 MG tablet Take 10 mg by mouth daily.     cyclobenzaprine (FLEXERIL) 10 MG tablet Take 1 tablet (10 mg total) by mouth 3 (three) times daily as needed for muscle spasms.  30 tablet 0   fluticasone (FLONASE) 50 MCG/ACT nasal spray Place 1 spray into both nostrils daily.     lansoprazole (PREVACID) 30 MG capsule Take 30 mg by mouth daily at 12 noon.     metoprolol tartrate (LOPRESSOR) 25 MG tablet TAKE 0.5 TABLETS BY MOUTH 2 TIMES DAILY. 90 tablet 3   testosterone cypionate (DEPOTESTOSTERONE CYPIONATE) 200 MG/ML injection Inject 1 mL (200 mg total) into the muscle every 14 (fourteen) days. 10 mL 0   escitalopram (LEXAPRO) 10 MG tablet TAKE 1 TABLET BY MOUTH EVERY DAY (Patient not taking: Reported on 04/14/2022) 90 tablet 2   No current facility-administered medications on file prior to visit.   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: [employer]  Tobacco Use   Smoking status: Former    Types: Cigarettes   Smokeless tobacco: Former    Quit date: 02/12/2011   Tobacco comments:    Light use  Substance and Sexual Activity   Alcohol use: Yes    Comment: occ   Drug use: No   Sexual activity: Yes  Other Topics Concern   Not on file  Social History Narrative   Not on file   Social Determinants of Health   Financial Resource Strain: Not on file  Food Insecurity: Not on file  Transportation Needs: Not on file  Physical Activity: Not on file  Stress: Not on file  Social Connections: Not on file  Intimate Partner Violence: Not on file      Review of Systems  Cardiovascular:  Positive for palpitations.  All other systems reviewed and are negative.     Objective:   Physical Exam Vitals reviewed.  Constitutional:      Appearance: Normal appearance. He is normal weight.  Eyes:     Conjunctiva/sclera: Conjunctivae normal.  Neck:     Thyroid: No thyroid mass, thyromegaly or thyroid tenderness.   Cardiovascular:     Rate and Rhythm: Normal rate and regular rhythm.     Pulses: Normal pulses.     Heart sounds: Normal heart sounds. No murmur heard.   No friction rub. No gallop.  Pulmonary:     Effort: Pulmonary effort is normal. No respiratory distress.     Breath sounds: Normal breath sounds. No stridor. No wheezing, rhonchi or rales.  Abdominal:     General: Abdomen is flat. Bowel sounds are normal. There is no distension.     Palpations: Abdomen is soft. There is no mass.     Tenderness: There is no abdominal tenderness. There is no guarding or rebound.     Hernia: No hernia is present.  Musculoskeletal:     Right lower leg: No edema.     Left lower leg: No edema.  Neurological:     General: No focal deficit present.     Mental Status: He is alert and oriented to person, place, and time. Mental status is at baseline.     Cranial Nerves: No cranial nerve deficit.     Sensory: No sensory deficit.     Motor: No weakness.     Coordination: Coordination normal.     Gait: Gait normal.     Deep Tendon Reflexes: Reflexes normal.          Assessment & Plan:  Hypogonadism in male - Plan: CBC with Differential/Platelet, Lipid panel, COMPLETE METABOLIC PANEL WITH GFR, Testosterone Total,Free,Bio, Males  General medical exam  Elevated bilirubin  Patient's physical exam today is normal.  Check CBC CMP fasting lipid panel and testosterone level.  Recommended discontinuation of metoprolol due to fatigue and dizziness.  If tinnitus and dizziness persist, consider a referral to ENT for possible Mnire's evaluation.  The remainder of his physical exam today is normal.

## 2022-04-15 LAB — CBC WITH DIFFERENTIAL/PLATELET
Absolute Monocytes: 727 cells/uL (ref 200–950)
Basophils Absolute: 46 cells/uL (ref 0–200)
Basophils Relative: 0.5 %
Eosinophils Absolute: 239 cells/uL (ref 15–500)
Eosinophils Relative: 2.6 %
HCT: 50.1 % — ABNORMAL HIGH (ref 38.5–50.0)
Hemoglobin: 17.3 g/dL — ABNORMAL HIGH (ref 13.2–17.1)
Lymphs Abs: 2309 cells/uL (ref 850–3900)
MCH: 30.8 pg (ref 27.0–33.0)
MCHC: 34.5 g/dL (ref 32.0–36.0)
MCV: 89.3 fL (ref 80.0–100.0)
MPV: 10.7 fL (ref 7.5–12.5)
Monocytes Relative: 7.9 %
Neutro Abs: 5879 cells/uL (ref 1500–7800)
Neutrophils Relative %: 63.9 %
Platelets: 308 10*3/uL (ref 140–400)
RBC: 5.61 10*6/uL (ref 4.20–5.80)
RDW: 12.3 % (ref 11.0–15.0)
Total Lymphocyte: 25.1 %
WBC: 9.2 10*3/uL (ref 3.8–10.8)

## 2022-04-15 LAB — COMPLETE METABOLIC PANEL WITH GFR
AG Ratio: 1.8 (calc) (ref 1.0–2.5)
ALT: 34 U/L (ref 9–46)
AST: 23 U/L (ref 10–40)
Albumin: 4.6 g/dL (ref 3.6–5.1)
Alkaline phosphatase (APISO): 64 U/L (ref 36–130)
BUN: 16 mg/dL (ref 7–25)
CO2: 25 mmol/L (ref 20–32)
Calcium: 10.2 mg/dL (ref 8.6–10.3)
Chloride: 103 mmol/L (ref 98–110)
Creat: 1.05 mg/dL (ref 0.60–1.26)
Globulin: 2.6 g/dL (calc) (ref 1.9–3.7)
Glucose, Bld: 87 mg/dL (ref 65–99)
Potassium: 4.7 mmol/L (ref 3.5–5.3)
Sodium: 138 mmol/L (ref 135–146)
Total Bilirubin: 1.9 mg/dL — ABNORMAL HIGH (ref 0.2–1.2)
Total Protein: 7.2 g/dL (ref 6.1–8.1)
eGFR: 94 mL/min/{1.73_m2} (ref >=60)

## 2022-04-15 LAB — LIPID PANEL
Cholesterol: 159 mg/dL (ref <200)
HDL: 40 mg/dL (ref >=40)
LDL Cholesterol (Calc): 98 mg/dL (calc)
Non-HDL Cholesterol (Calc): 119 mg/dL (calc) (ref <130)
Total CHOL/HDL Ratio: 4 (calc) (ref <5.0)
Triglycerides: 115 mg/dL (ref <150)

## 2022-04-15 LAB — TESTOSTERONE TOTAL,FREE,BIO, MALES
Albumin: 4.6 g/dL (ref 3.6–5.1)
Sex Hormone Binding: 23 nmol/L (ref 10–50)
Testosterone, Bioavailable: 293.2 ng/dL (ref 110.0–575.0)
Testosterone, Free: 139.6 pg/mL (ref 46.0–224.0)
Testosterone: 726 ng/dL (ref 250–827)

## 2022-04-17 ENCOUNTER — Encounter: Payer: Self-pay | Admitting: Family Medicine

## 2022-04-23 ENCOUNTER — Other Ambulatory Visit: Payer: Self-pay | Admitting: Family Medicine

## 2022-04-23 MED ORDER — CYCLOBENZAPRINE HCL 10 MG PO TABS
10.0000 mg | ORAL_TABLET | Freq: Three times a day (TID) | ORAL | 0 refills | Status: DC | PRN
Start: 2022-04-23 — End: 2023-06-26

## 2022-05-21 ENCOUNTER — Encounter: Payer: Self-pay | Admitting: Family Medicine

## 2022-05-30 ENCOUNTER — Telehealth: Payer: Self-pay

## 2022-05-30 DIAGNOSIS — H8109 Meniere's disease, unspecified ear: Secondary | ICD-10-CM

## 2022-05-30 DIAGNOSIS — E291 Testicular hypofunction: Secondary | ICD-10-CM

## 2022-05-30 DIAGNOSIS — H9319 Tinnitus, unspecified ear: Secondary | ICD-10-CM

## 2022-05-30 NOTE — Telephone Encounter (Signed)
Pt called in stating that he was recently seen in office for tinnitis and dizziness. Pt stated that pcp mentioned sending a referral for ENT specialist. Pt would like to go ahead and get that referral sent in please.  Cb#: [phone]

## 2022-06-07 ENCOUNTER — Encounter (INDEPENDENT_AMBULATORY_CARE_PROVIDER_SITE_OTHER): Payer: Self-pay

## 2022-06-07 ENCOUNTER — Telehealth: Payer: No Typology Code available for payment source | Admitting: Physician Assistant

## 2022-06-07 DIAGNOSIS — U071 COVID-19: Secondary | ICD-10-CM | POA: Diagnosis not present

## 2022-06-07 DIAGNOSIS — J020 Streptococcal pharyngitis: Secondary | ICD-10-CM

## 2022-06-07 MED ORDER — NIRMATRELVIR/RITONAVIR (PAXLOVID)TABLET: 3.0000 | ORAL_TABLET | Freq: Two times a day (BID) | ORAL | 0 refills | Status: AC

## 2022-06-07 MED ORDER — AMOXICILLIN 500 MG PO CAPS: 500.0000 mg | ORAL_CAPSULE | Freq: Two times a day (BID) | ORAL | 0 refills | Status: DC

## 2022-06-07 MED ORDER — ONDANSETRON HCL 4 MG PO TABS: 4.0000 mg | ORAL_TABLET | Freq: Three times a day (TID) | ORAL | 0 refills | Status: DC | PRN

## 2022-06-07 NOTE — Progress Notes (Signed)

## 2022-06-07 NOTE — Addendum Note (Signed)
Addended by: Mar Daring on: 06/07/2022 07:42 AM   Modules accepted: Orders

## 2022-06-07 NOTE — Patient Instructions (Signed)
Kent Hernandez, thank you for joining Mar Daring, PA-C for today's virtual visit.  While this provider is not your primary care provider (PCP), if your PCP is located in our provider database this encounter information will be shared with them immediately following your visit.  Consent: (Patient) Kent Hernandez provided verbal consent for this virtual visit at the beginning of the encounter.  Current Medications:  Current Outpatient Medications:    nirmatrelvir/ritonavir EUA (PAXLOVID) 20 x 150 MG & 10 x '100MG'$  TABS, Take 3 tablets by mouth 2 (two) times daily for 5 days. (Take nirmatrelvir 150 mg two tablets twice daily for 5 days and ritonavir 100 mg one tablet twice daily for 5 days) Patient GFR is 94, Disp: 30 tablet, Rfl: 0   albuterol (VENTOLIN HFA) 108 (90 Base) MCG/ACT inhaler, Inhale 2 puffs into the lungs every 6 (six) hours as needed for wheezing or shortness of breath., Disp: 1 each, Rfl: 0   ALPRAZolam (XANAX) 0.5 MG tablet, Take 1 tablet (0.5 mg total) by mouth 3 (three) times daily as needed., Disp: 30 tablet, Rfl: 0   ascorbic acid (VITAMIN C) 500 MG tablet, Take 1 tablet (500 mg total) by mouth daily., Disp: , Rfl:    cetirizine (ZYRTEC) 10 MG tablet, Take 10 mg by mouth daily., Disp: , Rfl:    cyclobenzaprine (FLEXERIL) 10 MG tablet, Take 1 tablet (10 mg total) by mouth 3 (three) times daily as needed for muscle spasms., Disp: 30 tablet, Rfl: 0   fluticasone (FLONASE) 50 MCG/ACT nasal spray, Place 1 spray into both nostrils daily., Disp: , Rfl:    lansoprazole (PREVACID) 30 MG capsule, Take 30 mg by mouth daily at 12 noon., Disp: , Rfl:    ondansetron (ZOFRAN) 4 MG tablet, Take 1 tablet (4 mg total) by mouth every 8 (eight) hours as needed for nausea or vomiting., Disp: 20 tablet, Rfl: 0   testosterone cypionate (DEPOTESTOSTERONE CYPIONATE) 200 MG/ML injection, Inject 1 mL (200 mg total) into the muscle every 14 (fourteen) days., Disp: 10 mL, Rfl: 0   Medications ordered  in this encounter:  Meds ordered this encounter  Medications   nirmatrelvir/ritonavir EUA (PAXLOVID) 20 x 150 MG & 10 x '100MG'$  TABS    Sig: Take 3 tablets by mouth 2 (two) times daily for 5 days. (Take nirmatrelvir 150 mg two tablets twice daily for 5 days and ritonavir 100 mg one tablet twice daily for 5 days) Patient GFR is 94    Dispense:  30 tablet    Refill:  0    Order Specific Question:   Supervising Provider    Answer:   Noemi Chapel [[id number]]     *If you need refills on other medications prior to your next appointment, please contact your pharmacy*  Follow-Up: Call back or seek an in-person evaluation if the symptoms worsen or if the condition fails to improve as anticipated.  Other Instructions COVID-19: Quarantine and Isolation Quarantine If you were exposed Quarantine and stay away from others when you have been in close contact with someone who has COVID-19. Isolate If you are sick or test positive Isolate when you are sick or when you have COVID-19, even if you don't have symptoms. When to stay home Calculating quarantine The date of your exposure is considered day 0. Day 1 is the first full day after your last contact with a person who has had COVID-19. Stay home and away from other people for at least 5 days. Learn why  CDC updated guidance for the general public. IF YOU were exposed to COVID-19 and are NOT  up to dateIF YOU were exposed to COVID-19 and are NOT on COVID-19 vaccinations Quarantine for at least 5 days Stay home Stay home and quarantine for at least 5 full days. Wear a well-fitting mask if you must be around others in your home. Do not travel. Get tested Even if you don't develop symptoms, get tested at least 5 days after you last had close contact with someone with COVID-19. After quarantine Watch for symptoms Watch for symptoms until 10 days after you last had close contact with someone with COVID-19. Avoid travel It is best to avoid travel until a  full 10 days after you last had close contact with someone with COVID-19. If you develop symptoms Isolate immediately and get tested. Continue to stay home until you know the results. Wear a well-fitting mask around others. Take precautions until day 10 Wear a well-fitting mask Wear a well-fitting mask for 10 full days any time you are around others inside your home or in public. Do not go to places where you are unable to wear a well-fitting mask. If you must travel during days 6-10, take precautions. Avoid being around people who are more likely to get very sick from COVID-19. IF YOU were exposed to COVID-19 and are  up to dateIF YOU were exposed to COVID-19 and are on COVID-19 vaccinations No quarantine You do not need to stay home unless you develop symptoms. Get tested Even if you don't develop symptoms, get tested at least 5 days after you last had close contact with someone with COVID-19. Watch for symptoms Watch for symptoms until 10 days after you last had close contact with someone with COVID-19. If you develop symptoms Isolate immediately and get tested. Continue to stay home until you know the results. Wear a well-fitting mask around others. Take precautions until day 10 Wear a well-fitting mask Wear a well-fitting mask for 10 full days any time you are around others inside your home or in public. Do not go to places where you are unable to wear a well-fitting mask. Take precautions if traveling Avoid being around people who are more likely to get very sick from COVID-19. IF YOU were exposed to COVID-19 and had confirmed COVID-19 within the past 90 days (you tested positive using a viral test) No quarantine You do not need to stay home unless you develop symptoms. Watch for symptoms Watch for symptoms until 10 days after you last had close contact with someone with COVID-19. If you develop symptoms Isolate immediately and get tested. Continue to stay home until you know the  results. Wear a well-fitting mask around others. Take precautions until day 10 Wear a well-fitting mask Wear a well-fitting mask for 10 full days any time you are around others inside your home or in public. Do not go to places where you are unable to wear a well-fitting mask. Take precautions if traveling Avoid being around people who are more likely to get very sick from COVID-19. Calculating isolation Day 0 is your first day of symptoms or a positive viral test. Day 1 is the first full day after your symptoms developed or your test specimen was collected. If you have COVID-19 or have symptoms, isolate for at least 5 days. IF YOU tested positive for COVID-19 or have symptoms, regardless of vaccination status Stay home for at least 5 days Stay home for 5 days and isolate from  others in your home. Wear a well-fitting mask if you must be around others in your home. Do not travel. Ending isolation if you had symptoms End isolation after 5 full days if you are fever-free for 24 hours (without the use of fever-reducing medication) and your symptoms are improving. Ending isolation if you did NOT have symptoms End isolation after at least 5 full days after your positive test. If you got very sick from COVID-19 or have a weakened immune system You should isolate for at least 10 days. Consult your doctor before ending isolation. Take precautions until day 10 Wear a well-fitting mask Wear a well-fitting mask for 10 full days any time you are around others inside your home or in public. Do not go to places where you are unable to wear a well-fitting mask. Do not travel Do not travel until a full 10 days after your symptoms started or the date your positive test was taken if you had no symptoms. Avoid being around people who are more likely to get very sick from COVID-19. Definitions Exposure Contact with someone infected with SARS-CoV-2, the virus that causes COVID-19, in a way that increases the  likelihood of getting infected with the virus. Close contact A close contact is someone who was less than 6 feet away from an infected person (laboratory-confirmed or a clinical diagnosis) for a cumulative total of 15 minutes or more over a 24-hour period. For example, three individual 5-minute exposures for a total of 15 minutes. People who are exposed to someone with COVID-19 after they completed at least 5 days of isolation are not considered close contacts. Julio Sicks is a strategy used to prevent transmission of COVID-19 by keeping people who have been in close contact with someone with COVID-19 apart from others. Who does not need to quarantine? If you had close contact with someone with COVID-19 and you are in one of the following groups, you do not need to quarantine. You are up to date with your COVID-19 vaccines. You had confirmed COVID-19 within the last 90 days (meaning you tested positive using a viral test). If you are up to date with COVID-19 vaccines, you should wear a well-fitting mask around others for 10 days from the date of your last close contact with someone with COVID-19 (the date of last close contact is considered day 0). Get tested at least 5 days after you last had close contact with someone with COVID-19. If you test positive or develop COVID-19 symptoms, isolate from other people and follow recommendations in the Isolation section below. If you tested positive for COVID-19 with a viral test within the previous 90 days and subsequently recovered and remain without COVID-19 symptoms, you do not need to quarantine or get tested after close contact. You should wear a well-fitting mask around others for 10 days from the date of your last close contact with someone with COVID-19 (the date of last close contact is considered day 0). If you have COVID-19 symptoms, get tested and isolate from other people and follow recommendations in the Isolation section below. Who should  quarantine? If you come into close contact with someone with COVID-19, you should quarantine if you are not up to date on COVID-19 vaccines. This includes people who are not vaccinated. What to do for quarantine Stay home and away from other people for at least 5 days (day 0 through day 5) after your last contact with a person who has COVID-19. The date of your exposure is  considered day 0. Wear a well-fitting mask when around others at home, if possible. For 10 days after your last close contact with someone with COVID-19, watch for fever (100.59F or greater), cough, shortness of breath, or other COVID-19 symptoms. If you develop symptoms, get tested immediately and isolate until you receive your test results. If you test positive, follow isolation recommendations. If you do not develop symptoms, get tested at least 5 days after you last had close contact with someone with COVID-19. If you test negative, you can leave your home, but continue to wear a well-fitting mask when around others at home and in public until 10 days after your last close contact with someone with COVID-19. If you test positive, you should isolate for at least 5 days from the date of your positive test (if you do not have symptoms). If you do develop COVID-19 symptoms, isolate for at least 5 days from the date your symptoms began (the date the symptoms started is day 0). Follow recommendations in the isolation section below. If you are unable to get a test 5 days after last close contact with someone with COVID-19, you can leave your home after day 5 if you have been without COVID-19 symptoms throughout the 5-day period. Wear a well-fitting mask for 10 days after your date of last close contact when around others at home and in public. Avoid people who are have weakened immune systems or are more likely to get very sick from COVID-19, and nursing homes and other high-risk settings, until after at least 10 days. If possible, stay  away from people you live with, especially people who are at higher risk for getting very sick from COVID-19, as well as others outside your home throughout the full 10 days after your last close contact with someone with COVID-19. If you are unable to quarantine, you should wear a well-fitting mask for 10 days when around others at home and in public. If you are unable to wear a mask when around others, you should continue to quarantine for 10 days. Avoid people who have weakened immune systems or are more likely to get very sick from COVID-19, and nursing homes and other high-risk settings, until after at least 10 days. See additional information about travel. Do not go to places where you are unable to wear a mask, such as restaurants and some gyms, and avoid eating around others at home and at work until after 10 days after your last close contact with someone with COVID-19. After quarantine Watch for symptoms until 10 days after your last close contact with someone with COVID-19. If you have symptoms, isolate immediately and get tested. Quarantine in high-risk congregate settings In certain congregate settings that have high risk of secondary transmission (such as Systems analyst and detention facilities, homeless shelters, or cruise ships), CDC recommends a 10-day quarantine for residents, regardless of vaccination and booster status. During periods of critical staffing shortages, facilities may consider shortening the quarantine period for staff to ensure continuity of operations. Decisions to shorten quarantine in these settings should be made in consultation with state, local, tribal, or territorial health departments and should take into consideration the context and characteristics of the facility. CDC's setting-specific guidance provides additional recommendations for these settings. Isolation Isolation is used to separate people with confirmed or suspected COVID-19 from those without COVID-19.  People who are in isolation should stay home until it's safe for them to be around others. At home, anyone sick or infected should separate  from others, or wear a well-fitting mask when they need to be around others. People in isolation should stay in a specific "sick room" or area and use a separate bathroom if available. Everyone who has presumed or confirmed COVID-19 should stay home and isolate from other people for at least 5 full days (day 0 is the first day of symptoms or the date of the day of the positive viral test for asymptomatic persons). They should wear a mask when around others at home and in public for an additional 5 days. People who are confirmed to have COVID-19 or are showing symptoms of COVID-19 need to isolate regardless of their vaccination status. This includes: People who have a positive viral test for COVID-19, regardless of whether or not they have symptoms. People with symptoms of COVID-19, including people who are awaiting test results or have not been tested. People with symptoms should isolate even if they do not know if they have been in close contact with someone with COVID-19. What to do for isolation Monitor your symptoms. If you have an emergency warning sign (including trouble breathing), seek emergency medical care immediately. Stay in a separate room from other household members, if possible. Use a separate bathroom, if possible. Take steps to improve ventilation at home, if possible. Avoid contact with other members of the household and pets. Don't share personal household items, like cups, towels, and utensils. Wear a well-fitting mask when you need to be around other people. Learn more about what to do if you are sick and how to notify your contacts. Ending isolation for people who had COVID-19 and had symptoms If you had COVID-19 and had symptoms, isolate for at least 5 days. To calculate your 5-day isolation period, day 0 is your first day of symptoms. Day 1  is the first full day after your symptoms developed. You can leave isolation after 5 full days. You can end isolation after 5 full days if you are fever-free for 24 hours without the use of fever-reducing medication and your other symptoms have improved (Loss of taste and smell may persist for weeks or months after recovery and need not delay the end of isolation). You should continue to wear a well-fitting mask around others at home and in public for 5 additional days (day 6 through day 10) after the end of your 5-day isolation period. If you are unable to wear a mask when around others, you should continue to isolate for a full 10 days. Avoid people who have weakened immune systems or are more likely to get very sick from COVID-19, and nursing homes and other high-risk settings, until after at least 10 days. If you continue to have fever or your other symptoms have not improved after 5 days of isolation, you should wait to end your isolation until you are fever-free for 24 hours without the use of fever-reducing medication and your other symptoms have improved. Continue to wear a well-fitting mask through day 10. Contact your healthcare provider if you have questions. See additional information about travel. Do not go to places where you are unable to wear a mask, such as restaurants and some gyms, and avoid eating around others at home and at work until a full 10 days after your first day of symptoms. If an individual has access to a test and wants to test, the best approach is to use an antigen test1 towards the end of the 5-day isolation period. Collect the test sample only if you  are fever-free for 24 hours without the use of fever-reducing medication and your other symptoms have improved (loss of taste and smell may persist for weeks or months after recovery and need not delay the end of isolation). If your test result is positive, you should continue to isolate until day 10. If your test result is  negative, you can end isolation, but continue to wear a well-fitting mask around others at home and in public until day 10. Follow additional recommendations for masking and avoiding travel as described above. 1As noted in the labeling for authorized over-the counter antigen tests: Negative results should be treated as presumptive. Negative results do not rule out SARS-CoV-2 infection and should not be used as the sole basis for treatment or patient management decisions, including infection control decisions. To improve results, antigen tests should be used twice over a three-day period with at least 24 hours and no more than 48 hours between tests. Note that these recommendations on ending isolation do not apply to people who are moderately ill or very sick from COVID-19 or have weakened immune systems. See section below for recommendations for when to end isolation for these groups. Ending isolation for people who tested positive for COVID-19 but had no symptoms If you test positive for COVID-19 and never develop symptoms, isolate for at least 5 days. Day 0 is the day of your positive viral test (based on the date you were tested) and day 1 is the first full day after the specimen was collected for your positive test. You can leave isolation after 5 full days. If you continue to have no symptoms, you can end isolation after at least 5 days. You should continue to wear a well-fitting mask around others at home and in public until day 10 (day 6 through day 10). If you are unable to wear a mask when around others, you should continue to isolate for 10 days. Avoid people who have weakened immune systems or are more likely to get very sick from COVID-19, and nursing homes and other high-risk settings, until after at least 10 days. If you develop symptoms after testing positive, your 5-day isolation period should start over. Day 0 is your first day of symptoms. Follow the recommendations above for ending  isolation for people who had COVID-19 and had symptoms. See additional information about travel. Do not go to places where you are unable to wear a mask, such as restaurants and some gyms, and avoid eating around others at home and at work until 10 days after the day of your positive test. If an individual has access to a test and wants to test, the best approach is to use an antigen test1 towards the end of the 5-day isolation period. If your test result is positive, you should continue to isolate until day 10. If your test result is positive, you can also choose to test daily and if your test result is negative, you can end isolation, but continue to wear a well-fitting mask around others at home and in public until day 10. Follow additional recommendations for masking and avoiding travel as described above. 1As noted in the labeling for authorized over-the counter antigen tests: Negative results should be treated as presumptive. Negative results do not rule out SARS-CoV-2 infection and should not be used as the sole basis for treatment or patient management decisions, including infection control decisions. To improve results, antigen tests should be used twice over a three-day period with at least 24 hours  and no more than 48 hours between tests. Ending isolation for people who were moderately or very sick from COVID-19 or have a weakened immune system People who are moderately ill from COVID-19 (experiencing symptoms that affect the lungs like shortness of breath or difficulty breathing) should isolate for 10 days and follow all other isolation precautions. To calculate your 10-day isolation period, day 0 is your first day of symptoms. Day 1 is the first full day after your symptoms developed. If you are unsure if your symptoms are moderate, talk to a healthcare provider for further guidance. People who are very sick from COVID-19 (this means people who were hospitalized or required intensive care or  ventilation support) and people who have weakened immune systems might need to isolate at home longer. They may also require testing with a viral test to determine when they can be around others. CDC recommends an isolation period of at least 10 and up to 20 days for people who were very sick from COVID-19 and for people with weakened immune systems. Consult with your healthcare provider about when you can resume being around other people. If you are unsure if your symptoms are severe or if you have a weakened immune system, talk to a healthcare provider for further guidance. People who have a weakened immune system should talk to their healthcare provider about the potential for reduced immune responses to COVID-19 vaccines and the need to continue to follow current prevention measures (including wearing a well-fitting mask and avoiding crowds and poorly ventilated indoor spaces) to protect themselves against COVID-19 until advised otherwise by their healthcare provider. Close contacts of immunocompromised people--including household members--should also be encouraged to receive all recommended COVID-19 vaccine doses to help protect these people. Isolation in high-risk congregate settings In certain high-risk congregate settings that have high risk of secondary transmission and where it is not feasible to cohort people (such as Systems analyst and detention facilities, homeless shelters, and cruise ships), CDC recommends a 10-day isolation period for residents. During periods of critical staffing shortages, facilities may consider shortening the isolation period for staff to ensure continuity of operations. Decisions to shorten isolation in these settings should be made in consultation with state, local, tribal, or territorial health departments and should take into consideration the context and characteristics of the facility. CDC's setting-specific guidance provides additional recommendations for these  settings. This CDC guidance is meant to supplement--not replace--any federal, state, local, territorial, or tribal health and safety laws, rules, and regulations. Recommendations for specific settings These recommendations do not apply to healthcare professionals. For guidance specific to these settings, see Healthcare professionals: Interim Guidance for Optician, dispensing with SARS-CoV-2 Infection or Exposure to SARS-CoV-2 Patients, residents, and visitors to healthcare settings: Interim Infection Prevention and Control Recommendations for Healthcare Personnel During the [place] 2019 (COVID-19) Pandemic Additional setting-specific guidance and recommendations are available. These recommendations on quarantine and isolation do apply to [facility] settings. Additional guidance is available here: Overview of COVID-19 Quarantine for K-12 Schools Travelers: Travel information and recommendations Congregate facilities and other settings: [employer] for community, work, and school settings Ongoing COVID-19 exposure FAQs I live with someone with COVID-19, but I cannot be separated from them. How do we manage quarantine in this situation? It is very important for people with COVID-19 to remain apart from other people, if possible, even if they are living together. If separation of the person with COVID-19 from others that they live with is not possible, the other people that  they live with will have ongoing exposure, meaning they will be repeatedly exposed until that person is no longer able to spread the virus to other people. In this situation, there are precautions you can take to limit the spread of COVID-19: The person with COVID-19 and everyone they live with should wear a well-fitting mask inside the home. If possible, one person should care for the person with COVID-19 to limit the number of people who are in close contact with the infected person. Take steps to protect  yourself and others to reduce transmission in the home: Quarantine if you are not up to date with your COVID-19 vaccines. Isolate if you are sick or tested positive for COVID-19, even if you don't have symptoms. Learn more about the public health recommendations for testing, mask use and quarantine of close contacts, like yourself, who have ongoing exposure. These recommendations differ depending on your vaccination status. What should I do if I have ongoing exposure to COVID-19 from someone I live with? Recommendations for this situation depend on your vaccination status: If you are not up to date on COVID-19 vaccines and have ongoing exposure to COVID-19, you should: Begin quarantine immediately and continue to quarantine throughout the isolation period of the person with COVID-19. Continue to quarantine for an additional 5 days starting the day after the end of isolation for the person with COVID-19. Get tested at least 5 days after the end of isolation of the infected person that lives with them. If you test negative, you can leave the home but should continue to wear a well-fitting mask when around others at home and in public until 10 days after the end of isolation for the person with COVID-19. Isolate immediately if you develop symptoms of COVID-19 or test positive. If you are up to date with COVID-19 vaccines and have ongoing exposure to COVID-19, you should: Get tested at least 5 days after your first exposure. A person with COVID-19 is considered infectious starting 2 days before they develop symptoms, or 2 days before the date of their positive test if they do not have symptoms. Get tested again at least 5 days after the end of isolation for the person with COVID-19. Wear a well-fitting mask when you are around the person with COVID-19, and do this throughout their isolation period. Wear a well-fitting mask around others for 10 days after the infected person's isolation period  ends. Isolate immediately if you develop symptoms of COVID-19 or test positive. What should I do if multiple people I live with test positive for COVID-19 at different times? Recommendations for this situation depend on your vaccination status: If you are not up to date with your COVID-19 vaccines, you should: Quarantine throughout the isolation period of any infected person that you live with. Continue to quarantine until 5 days after the end of isolation date for the most recently infected person that lives with you. For example, if the last day of isolation of the person most recently infected with COVID-19 was June 30, the new 5-day quarantine period starts on July 1. Get tested at least 5 days after the end of isolation for the most recently infected person that lives with you. Wear a well-fitting mask when you are around any person with COVID-19 while that person is in isolation. Wear a well-fitting mask when you are around other people until 10 days after your last close contact. Isolate immediately if you develop symptoms of COVID-19 or test positive. If you are up  to date with your COVID-19 vaccines, you should: Get tested at least 5 days after your first exposure. A person with COVID-19 is considered infectious starting 2 days before they developed symptoms, or 2 days before the date of their positive test if they do not have symptoms. Get tested again at least 5 days after the end of isolation for the most recently infected person that lives with you. Wear a well-fitting mask when you are around any person with COVID-19 while that person is in isolation. Wear a well-fitting mask around others for 10 days after the end of isolation for the most recently infected person that lives with you. For example, if the last day of isolation for the person most recently infected with COVID-19 was June 30, the new 10-day period to wear a well-fitting mask indoors in public starts on July 1. Isolate  immediately if you develop symptoms of COVID-19 or test positive. I had COVID-19 and completed isolation. Do I have to quarantine or get tested if someone I live with gets COVID-19 shortly after I completed isolation? No. If you recently completed isolation and someone that lives with you tests positive for the virus that causes COVID-19 shortly after the end of your isolation period, you do not have to quarantine or get tested as long as you do not develop new symptoms. Once all of the people that live together have completed isolation or quarantine, refer to the guidance below for new exposures to COVID-19. If you had COVID-19 in the previous 90 days and then came into close contact with someone with COVID-19, you do not have to quarantine or get tested if you do not have symptoms. But you should: Wear a well-fitting mask indoors in public for 10 days after your last close contact. Monitor for COVID-19 symptoms for 10 days from the date of your last close contact. Isolate immediately and get tested if symptoms develop. If more than 90 days have passed since your recovery from infection, follow CDC's recommendations for close contacts. These recommendations will differ depending on your vaccination status. 02/09/2021 Content source: [facility] ([facility]), Division of Viral Diseases This information is not intended to replace advice given to you by your health care provider. Make sure you discuss any questions you have with your health care provider. Document Revised: 06/15/2021 Document Reviewed: 06/15/2021 Elsevier Patient Education  2022 [employer].    If you have been instructed to have an in-person evaluation today at a local Urgent Care facility, please use the link below. It will take you to a list of all of our available [facility], including address, phone number and hours of operation. Please do not delay care.  [facility]  [facility]  If you or a family member do not have a primary care provider, use the link below to schedule a visit and establish care. When you choose a [facility] primary care physician or advanced practice provider, you gain a long-term partner in health. Find a Primary Care Provider  Learn more about [facility]'s in-office and virtual care options: [facility]

## 2022-06-07 NOTE — Progress Notes (Signed)
Virtual Visit Consent   Kent Hernandez, you are scheduled for a virtual visit with a [facility] provider today. Just as with appointments in the office, your consent must be obtained to participate. Your consent will be active for this visit and any virtual visit you may have with one of our providers in the next 365 days. If you have a MyChart account, a copy of this consent can be sent to you electronically.  As this is a virtual visit, video technology does not allow for your provider to perform a traditional examination. This may limit your provider's ability to fully assess your condition. If your provider identifies any concerns that need to be evaluated in person or the need to arrange testing (such as labs, EKG, etc.), we will make arrangements to do so. Although advances in technology are sophisticated, we cannot ensure that it will always work on either your end or our end. If the connection with a video visit is poor, the visit may have to be switched to a telephone visit. With either a video or telephone visit, we are not always able to ensure that we have a secure connection.  By engaging in this virtual visit, you consent to the provision of healthcare and authorize for your insurance to be billed (if applicable) for the services provided during this visit. Depending on your insurance coverage, you may receive a charge related to this service.  I need to obtain your verbal consent now. Are you willing to proceed with your visit today? VISHAAL Hernandez has provided verbal consent on 06/07/2022 for a virtual visit (video or telephone). Mar Daring, PA-C  Date: 06/07/2022 9:34 AM  Virtual Visit via Video Note   I, Mar Daring, connected with  Kent Hernandez  ([id number], [birth year]) on 06/07/22 at  9:30 AM EDT by a video-enabled telemedicine application and verified that I am speaking with the correct person using two identifiers.  Location: Patient: Virtual Visit Location  Patient: Home Provider: Virtual Visit Location Provider: Home Office   I discussed the limitations of evaluation and management by telemedicine and the availability of in person appointments. The patient expressed understanding and agreed to proceed.    History of Present Illness: Kent Hernandez is a 36 y.o. who identifies as a male who was assigned male at birth, and is being seen today for Covid 69.  HPI: URI  This is a new problem. The current episode started today. The problem has been gradually worsening. Maximum temperature: low grade. Associated symptoms include congestion, coughing, headaches, nausea, rhinorrhea, sinus pain, a sore throat and vomiting (dry heaves). Pertinent negatives include no diarrhea. He has tried NSAIDs for the symptoms. The treatment provided no relief.      Problems:  Patient Active Problem List   Diagnosis Date Noted   Palpitations 04/12/2021   Fatty liver disease, nonalcoholic    Transaminitis 07/11/2020   Hypercalcemia 07/11/2020   Pneumonia due to COVID-19 virus 07/10/2020   Hyperlipidemia    Hypertension    DDD (degenerative disc disease), cervical 08/09/2016   Skin lesion 08/04/2013   Allergic rhinitis    GERD (gastroesophageal reflux disease) 07/11/2011   Dysphagia, unspecified(787.20) 06/12/2011   Esophageal reflux 06/12/2011    Allergies: No Known Allergies Medications:  Current Outpatient Medications:    nirmatrelvir/ritonavir EUA (PAXLOVID) 20 x 150 MG & 10 x '100MG'$  TABS, Take 3 tablets by mouth 2 (two) times daily for 5 days. (Take nirmatrelvir 150 mg two tablets twice  daily for 5 days and ritonavir 100 mg one tablet twice daily for 5 days) Patient GFR is 94, Disp: 30 tablet, Rfl: 0   albuterol (VENTOLIN HFA) 108 (90 Base) MCG/ACT inhaler, Inhale 2 puffs into the lungs every 6 (six) hours as needed for wheezing or shortness of breath., Disp: 1 each, Rfl: 0   ALPRAZolam (XANAX) 0.5 MG tablet, Take 1 tablet (0.5 mg total) by mouth 3 (three)  times daily as needed., Disp: 30 tablet, Rfl: 0   ascorbic acid (VITAMIN C) 500 MG tablet, Take 1 tablet (500 mg total) by mouth daily., Disp: , Rfl:    cetirizine (ZYRTEC) 10 MG tablet, Take 10 mg by mouth daily., Disp: , Rfl:    cyclobenzaprine (FLEXERIL) 10 MG tablet, Take 1 tablet (10 mg total) by mouth 3 (three) times daily as needed for muscle spasms., Disp: 30 tablet, Rfl: 0   fluticasone (FLONASE) 50 MCG/ACT nasal spray, Place 1 spray into both nostrils daily., Disp: , Rfl:    lansoprazole (PREVACID) 30 MG capsule, Take 30 mg by mouth daily at 12 noon., Disp: , Rfl:    ondansetron (ZOFRAN) 4 MG tablet, Take 1 tablet (4 mg total) by mouth every 8 (eight) hours as needed for nausea or vomiting., Disp: 20 tablet, Rfl: 0   testosterone cypionate (DEPOTESTOSTERONE CYPIONATE) 200 MG/ML injection, Inject 1 mL (200 mg total) into the muscle every 14 (fourteen) days., Disp: 10 mL, Rfl: 0  Observations/Objective: Patient is well-developed, well-nourished in no acute distress.  Resting comfortably at home.  Head is normocephalic, atraumatic.  No labored breathing.  Speech is clear and coherent with logical content.  Patient is alert and oriented at baseline.    Assessment and Plan: 1. COVID-19 - nirmatrelvir/ritonavir EUA (PAXLOVID) 20 x 150 MG & 10 x '100MG'$  TABS; Take 3 tablets by mouth 2 (two) times daily for 5 days. (Take nirmatrelvir 150 mg two tablets twice daily for 5 days and ritonavir 100 mg one tablet twice daily for 5 days) Patient GFR is 94  Dispense: 30 tablet; Refill: 0  - Continue OTC symptomatic management of choice - Will send OTC vitamins and supplement information through AVS - Paxlovid prescribed - Patient enrolled in MyChart symptom monitoring - Push fluids - Rest as needed - Discussed return precautions and when to seek in-person evaluation, sent via AVS as well   Follow Up Instructions: I discussed the assessment and treatment plan with the patient. The patient was  provided an opportunity to ask questions and all were answered. The patient agreed with the plan and demonstrated an understanding of the instructions.  A copy of instructions were sent to the patient via MyChart unless otherwise noted below.    The patient was advised to call back or seek an in-person evaluation if the symptoms worsen or if the condition fails to improve as anticipated.  Time:  I spent 10 minutes with the patient via telehealth technology discussing the above problems/concerns.    Mar Daring, PA-C

## 2022-06-09 ENCOUNTER — Encounter: Payer: No Typology Code available for payment source | Admitting: Family Medicine

## 2022-06-12 ENCOUNTER — Other Ambulatory Visit: Payer: Self-pay

## 2022-06-12 NOTE — Telephone Encounter (Signed)
Pharmacy faxed a refill request for metoprolol succinate (TOPROL-XL) 25 MG 24 hr tablet [[id number]]  DISCONTINUED   Order Details Dose: 25 mg Route: Oral Frequency: Daily  Dispense Quantity: 30 tablet Refills: 3        Sig: Take 1 tablet (25 mg total) by mouth daily.       Start Date: 01/16/19 End Date: 06/24/19  Discontinued by: Sheral Flow, LPN on 0/34/7425 95:63  Reason: Reorder

## 2022-06-13 NOTE — Telephone Encounter (Signed)
Medication discontinued 07/11/2020. Last ordered 04/05/2020. Requested Prescriptions  Pending Prescriptions Disp Refills  . metoprolol succinate (TOPROL-XL) 25 MG 24 hr tablet 90 tablet 3    Sig: Take 1 tablet (25 mg total) by mouth daily.     Cardiovascular:  Beta Blockers Passed - 06/12/2022  1:48 PM      Passed - Last BP in normal range    BP Readings from Last 1 Encounters:  04/14/22 116/82         Passed - Last Heart Rate in normal range    Pulse Readings from Last 1 Encounters:  04/14/22 68         Passed - Valid encounter within last 6 months    Recent Outpatient Visits          2 months ago Hypogonadism in male   [place], Cammie Mcgee, MD   4 months ago Other chest pain   [place] [facility] Schaumann, Cammie Mcgee, MD   5 months ago Fatigue, unspecified type   [place] Susy Frizzle, MD   8 months ago Upper respiratory tract infection, unspecified type   [place] Eulogio Bear, NP   1 year ago General medical exam   [place] Pickard, Cammie Mcgee, MD      Future Appointments            In 3 weeks Pickard, Cammie Mcgee, MD [place]

## 2022-06-14 ENCOUNTER — Other Ambulatory Visit: Payer: Self-pay | Admitting: Family Medicine

## 2022-06-15 MED ORDER — TESTOSTERONE CYPIONATE 200 MG/ML IM SOLN: 200.0000 mg | INTRAMUSCULAR | 0 refills | Status: DC

## 2022-07-04 ENCOUNTER — Ambulatory Visit (INDEPENDENT_AMBULATORY_CARE_PROVIDER_SITE_OTHER): Payer: No Typology Code available for payment source | Admitting: Family Medicine

## 2022-07-04 VITALS — BP 118/70 | HR 89 | Temp 97.9°F | Ht 69.0 in | Wt 177.4 lb

## 2022-07-04 DIAGNOSIS — Z0000 Encounter for general adult medical examination without abnormal findings: Secondary | ICD-10-CM | POA: Diagnosis not present

## 2022-07-04 DIAGNOSIS — E291 Testicular hypofunction: Secondary | ICD-10-CM | POA: Diagnosis not present

## 2022-07-04 DIAGNOSIS — E782 Mixed hyperlipidemia: Secondary | ICD-10-CM

## 2022-07-04 DIAGNOSIS — K760 Fatty (change of) liver, not elsewhere classified: Secondary | ICD-10-CM | POA: Diagnosis not present

## 2022-07-04 NOTE — Progress Notes (Signed)
Subjective:    Patient ID: Kent Hernandez, male    DOB: [birth year], 36 y.o.   MRN: [id number]  HPI Patient is a very pleasant 36 year old Caucasian gentleman here today for complete physical exam.  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  Unfortunately he recently had COVID again but fortunately it was a very mild case.  He is reporting some mild shortness of breath over the last 3 weeks and is not sure if that is due to the recent COVID it could be due to the heat.  However this is mild.  He is denying any chest pain that would make me suspicious for a pulmonary embolism.  He denies any hemoptysis or purulent sputum.  Otherwise he is doing well.  He definitely sees benefit from the testosterone replacement.  He is due to repeat a PSA to screen for any elevation after starting testosterone.  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  Otherwise he is doing well with no concerns. Past Medical History:  Diagnosis Date   Allergic rhinitis    DDD (degenerative disc disease), cervical 2015   Esophageal reflux    Fatty liver disease, nonalcoholic    GERD (gastroesophageal reflux disease)    Phreesia 04/12/2020   H. pylori infection    Hyperlipidemia    [employer]reesia 04/12/2020   Hypertension    [employer]reesia 04/12/2020   Liver hemangioma    No past surgical history on file. Current Outpatient Medications on File Prior to Visit  Medication Sig Dispense Refill   albuterol (VENTOLIN HFA) 108 (90 Base) MCG/ACT inhaler Inhale 2 puffs into the lungs every 6 (six) hours as needed for wheezing or shortness of breath. 1 each 0   ALPRAZolam (XANAX) 0.5 MG tablet Take 1 tablet (0.5 mg total) by mouth 3 (three) times daily as needed. 30 tablet 0   ascorbic acid (VITAMIN C) 500 MG  tablet Take 1 tablet (500 mg total) by mouth daily.     cetirizine (ZYRTEC) 10 MG tablet Take 10 mg by mouth daily.     cyclobenzaprine (FLEXERIL) 10 MG tablet Take 1 tablet (10 mg total) by mouth 3 (three) times daily as needed for muscle spasms. 30 tablet 0   fluticasone (FLONASE) 50 MCG/ACT nasal spray Place 1 spray into both nostrils daily.     lansoprazole (PREVACID) 30 MG capsule Take 30 mg by mouth daily at 12 noon.     testosterone cypionate (DEPOTESTOSTERONE CYPIONATE) 200 MG/ML injection Inject 1 mL (200 mg total) into the muscle every 14 (fourteen) days. 10 mL 0   No current facility-administered medications on file prior to visit.   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: [employer]  Tobacco Use   Smoking status: Former    Types: Cigarettes   Smokeless tobacco: Former    Quit date: 02/12/2011   Tobacco comments:    Light use  Substance and Sexual Activity   Alcohol use: Yes    Comment: occ   Drug use: No   Sexual  activity: Yes  Other Topics Concern   Not on file  Social History Narrative   Not on file   Social Determinants of Health   Financial Resource Strain: Not on file  Food Insecurity: Not on file  Transportation Needs: Not on file  Physical Activity: Not on file  Stress: Not on file  Social Connections: Not on file  Intimate Partner Violence: Not on file   Family History  Problem Relation Age of Onset   Diabetes Maternal Uncle    Diabetes Paternal Grandfather    Heart disease Paternal Grandfather    Hypertension Father    Kidney disease Father    Cancer Maternal Grandmother    Heart disease Paternal Grandmother   '   Review of Systems     Objective:   Physical Exam Vitals reviewed.   Constitutional:      General: He is not in acute distress.    Appearance: He is normal weight. He is not ill-appearing, toxic-appearing or diaphoretic.  HENT:     Head: Normocephalic and atraumatic.     Right Ear: Tympanic membrane, ear canal and external ear normal. There is no impacted cerumen.     Left Ear: Tympanic membrane, ear canal and external ear normal. There is no impacted cerumen.     Nose: Nose normal. No congestion or rhinorrhea.     Mouth/Throat:     Mouth: Mucous membranes are moist.     Pharynx: No oropharyngeal exudate or posterior oropharyngeal erythema.  Eyes:     Extraocular Movements: Extraocular movements intact.     Conjunctiva/sclera: Conjunctivae normal.     Pupils: Pupils are equal, round, and reactive to light.  Neck:     Vascular: No carotid bruit.  Cardiovascular:     Rate and Rhythm: Normal rate and regular rhythm.     Pulses: Normal pulses.     Heart sounds: Normal heart sounds. No murmur heard.    No friction rub. No gallop.  Pulmonary:     Effort: Pulmonary effort is normal. No respiratory distress.     Breath sounds: Normal breath sounds. No stridor. No wheezing, rhonchi or rales.  Abdominal:     General: Abdomen is flat. Bowel sounds are normal. There is no distension.     Palpations: Abdomen is soft.     Tenderness: There is no abdominal tenderness. There is no guarding or rebound.     Hernia: No hernia is present.  Musculoskeletal:     Cervical back: Normal range of motion and neck supple. No rigidity or tenderness.     Right lower leg: No edema.     Left lower leg: No edema.  Lymphadenopathy:     Cervical: No cervical adenopathy.  Skin:    General: Skin is warm.     Coloration: Skin is not jaundiced or pale.     Findings: No bruising, erythema, lesion or rash.  Neurological:     General: No focal deficit present.     Mental Status: He is alert and oriented to person, place, and time. Mental status is at baseline.     Cranial Nerves:  No cranial nerve deficit.     Sensory: No sensory deficit.     Motor: No weakness.     Coordination: Coordination normal.     Gait: Gait normal.     Deep Tendon Reflexes: Reflexes normal.  Psychiatric:        Mood and Affect: Mood normal.        Behavior:  Behavior normal.        Thought Content: Thought content normal.        Judgment: Judgment normal.           Assessment & Plan:  Hypogonadism in male - Plan: CBC with Differential/Platelet, Lipid panel, COMPLETE METABOLIC PANEL WITH GFR, PSA, Testosterone Total,Free,Bio, Males  General medical exam - Plan: CBC with Differential/Platelet, Lipid panel, COMPLETE METABOLIC PANEL WITH GFR, PSA, Testosterone Total,Free,Bio, Males  Fatty liver disease, nonalcoholic - Plan: CBC with Differential/Platelet, Lipid panel, COMPLETE METABOLIC PANEL WITH GFR, PSA, Testosterone Total,Free,Bio, Males  Mixed hyperlipidemia - Plan: CBC with Differential/Platelet, Lipid panel, COMPLETE METABOLIC PANEL WITH GFR, PSA, Testosterone Total,Free,Bio, Males Physical exam outstanding.  The patient reports some foul-smelling urine and darker colored urine since having COVID.  He is concerned that this could have irritated his liver.  I will certainly check his liver function test given his history of fatty liver disease however the patient is now watching his diet and losing weight intentionally so I believe that this will help address the underlying fatty liver disease.  Symptomatically he is improving on testosterone replacement.  I will check a CBC to monitor polycythemia.  I will check a PSA to monitor for any enlargement related to testosterone replacement.  I will check a testosterone level.  In addition I will get his baseline CMP to monitor his kidney and liver test and also check a fasting lipid panel.

## 2022-07-05 LAB — CBC WITH DIFFERENTIAL/PLATELET
Absolute Monocytes: 501 cells/uL (ref 200–950)
Basophils Absolute: 39 cells/uL (ref 0–200)
Basophils Relative: 0.6 %
Eosinophils Absolute: 202 cells/uL (ref 15–500)
Eosinophils Relative: 3.1 %
HCT: 51.7 % — ABNORMAL HIGH (ref 38.5–50.0)
Hemoglobin: 17.5 g/dL — ABNORMAL HIGH (ref 13.2–17.1)
Lymphs Abs: 1573 cells/uL (ref 850–3900)
MCH: 29.7 pg (ref 27.0–33.0)
MCHC: 33.8 g/dL (ref 32.0–36.0)
MCV: 87.6 fL (ref 80.0–100.0)
MPV: 9.9 fL (ref 7.5–12.5)
Monocytes Relative: 7.7 %
Neutro Abs: 4186 cells/uL (ref 1500–7800)
Neutrophils Relative %: 64.4 %
Platelets: 353 10*3/uL (ref 140–400)
RBC: 5.9 10*6/uL — ABNORMAL HIGH (ref 4.20–5.80)
RDW: 12.1 % (ref 11.0–15.0)
Total Lymphocyte: 24.2 %
WBC: 6.5 10*3/uL (ref 3.8–10.8)

## 2022-07-05 LAB — COMPLETE METABOLIC PANEL WITH GFR
AG Ratio: 1.8 (calc) (ref 1.0–2.5)
ALT: 17 U/L (ref 9–46)
AST: 17 U/L (ref 10–40)
Albumin: 4.6 g/dL (ref 3.6–5.1)
Alkaline phosphatase (APISO): 68 U/L (ref 36–130)
BUN: 13 mg/dL (ref 7–25)
CO2: 27 mmol/L (ref 20–32)
Calcium: 9.7 mg/dL (ref 8.6–10.3)
Chloride: 102 mmol/L (ref 98–110)
Creat: 1.08 mg/dL (ref 0.60–1.26)
Globulin: 2.5 g/dL (calc) (ref 1.9–3.7)
Glucose, Bld: 92 mg/dL (ref 65–99)
Potassium: 4.1 mmol/L (ref 3.5–5.3)
Sodium: 140 mmol/L (ref 135–146)
Total Bilirubin: 1.7 mg/dL — ABNORMAL HIGH (ref 0.2–1.2)
Total Protein: 7.1 g/dL (ref 6.1–8.1)
eGFR: 91 mL/min/{1.73_m2} (ref >=60)

## 2022-07-05 LAB — TESTOSTERONE TOTAL,FREE,BIO, MALES
Albumin: 4.6 g/dL (ref 3.6–5.1)
Sex Hormone Binding: 22 nmol/L (ref 10–50)
Testosterone, Bioavailable: 202.7 ng/dL (ref 110.0–575.0)
Testosterone, Free: 96.5 pg/mL (ref 46.0–224.0)
Testosterone: 521 ng/dL (ref 250–827)

## 2022-07-05 LAB — LIPID PANEL
Cholesterol: 230 mg/dL — ABNORMAL HIGH (ref <200)
HDL: 37 mg/dL — ABNORMAL LOW (ref >=40)
LDL Cholesterol (Calc): 145 mg/dL (calc) — ABNORMAL HIGH
Non-HDL Cholesterol (Calc): 193 mg/dL (calc) — ABNORMAL HIGH (ref <130)
Total CHOL/HDL Ratio: 6.2 (calc) — ABNORMAL HIGH (ref <5.0)
Triglycerides: 291 mg/dL — ABNORMAL HIGH (ref <150)

## 2022-07-05 LAB — PSA: PSA: 0.54 ng/mL (ref <=4.00)

## 2022-07-07 ENCOUNTER — Encounter: Payer: Self-pay | Admitting: Family Medicine

## 2022-08-04 DIAGNOSIS — H903 Sensorineural hearing loss, bilateral: Secondary | ICD-10-CM | POA: Insufficient documentation

## 2022-08-04 DIAGNOSIS — H9312 Tinnitus, left ear: Secondary | ICD-10-CM | POA: Insufficient documentation

## 2022-08-07 ENCOUNTER — Other Ambulatory Visit: Payer: Self-pay | Admitting: Physician Assistant

## 2022-08-07 DIAGNOSIS — H903 Sensorineural hearing loss, bilateral: Secondary | ICD-10-CM

## 2022-08-28 ENCOUNTER — Ambulatory Visit
Admission: RE | Admit: 2022-08-28 | Discharge: 2022-08-28 | Disposition: A | Discharge status: Home / Self Care | Payer: No Typology Code available for payment source | Source: Ambulatory Visit | Attending: Physician Assistant | Admitting: Physician Assistant

## 2022-08-28 DIAGNOSIS — H903 Sensorineural hearing loss, bilateral: Secondary | ICD-10-CM

## 2022-08-28 MED ORDER — GADOPICLENOL 0.5 MMOL/ML IV SOLN
7.5000 mL | Freq: Once | INTRAVENOUS | Status: AC | PRN
  Administered 2022-08-28: 7.5 mL via INTRAVENOUS

## 2022-09-05 ENCOUNTER — Encounter: Payer: Self-pay | Admitting: Family Medicine

## 2022-10-07 ENCOUNTER — Other Ambulatory Visit: Payer: Self-pay | Admitting: Family Medicine

## 2022-12-25 ENCOUNTER — Ambulatory Visit: Payer: No Typology Code available for payment source | Admitting: Family Medicine

## 2022-12-25 VITALS — BP 128/76 | HR 84 | Temp 98.7°F | Ht 69.0 in | Wt 178.0 lb

## 2022-12-25 DIAGNOSIS — M542 Cervicalgia: Secondary | ICD-10-CM

## 2022-12-25 MED ORDER — CELECOXIB 200 MG PO CAPS: 200.0000 mg | ORAL_CAPSULE | Freq: Two times a day (BID) | ORAL | 0 refills | Status: DC

## 2022-12-25 NOTE — Progress Notes (Signed)
Subjective:    Patient ID: Kent Hernandez, male    DOB: 1986/03/16, 37 y.o.   MRN: [id number]  Neck Pain    Patient states that for the last month or so he has been having bilateral neck pain located at the base of the occiput that radiates up on both sides into his temples and along his parietal lobe.  Is a dull constant ache.  He has been taking Flexeril with minimal relief.  He also has some numbness and tingling and burning in his left arm.  He had an MRI of the neck.  2015.  At that time he was found to have mild degenerative disc disease at C5 5 C6.  There was no significant nerve impingement at that time.  He denies any injuries.  He denies any weakness in his left hand or weakness in his right hand.  He denies any trouble swallowing. Past Medical History:  Diagnosis Date   Allergic rhinitis    DDD (degenerative disc disease), cervical 2015   Esophageal reflux    Fatty liver disease, nonalcoholic    GERD (gastroesophageal reflux disease)    Phreesia 04/12/2020   H. pylori infection    Hyperlipidemia    Phreesia 04/12/2020   Hypertension    Phreesia 04/12/2020   Liver hemangioma    No past surgical history on file. Current Outpatient Medications on File Prior to Visit  Medication Sig Dispense Refill   albuterol (VENTOLIN HFA) 108 (90 Base) MCG/ACT inhaler Inhale 2 puffs into the lungs every 6 (six) hours as needed for wheezing or shortness of breath. 1 each 0   ALPRAZolam (XANAX) 0.5 MG tablet Take 1 tablet (0.5 mg total) by mouth 3 (three) times daily as needed. 30 tablet 0   ascorbic acid (VITAMIN C) 500 MG tablet Take 1 tablet (500 mg total) by mouth daily.     cetirizine (ZYRTEC) 10 MG tablet Take 10 mg by mouth daily.     cyclobenzaprine (FLEXERIL) 10 MG tablet Take 1 tablet (10 mg total) by mouth 3 (three) times daily as needed for muscle spasms. 30 tablet 0   fluticasone (FLONASE) 50 MCG/ACT nasal spray Place 1 spray into both nostrils daily.     lansoprazole (PREVACID)  30 MG capsule Take 30 mg by mouth daily at 12 noon.     testosterone cypionate (DEPOTESTOSTERONE CYPIONATE) 200 MG/ML injection INJECT 1 ML (200 MG TOTAL) INTO THE MUSCLE EVERY 14 DAYS 10 mL 0   No current facility-administered medications on file prior to visit.   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: [employer]  Tobacco Use   Smoking status: Former    Types: Cigarettes   Smokeless tobacco: Former    Quit date: 02/12/2011   Tobacco comments:    Light use  Substance and Sexual Activity   Alcohol use: Yes    Comment: occ   Drug use: No   Sexual activity: Yes  Other Topics Concern   Not on file  Social History Narrative   Not on file   Social Determinants of Health   Financial Resource Strain: Not on file  Food Insecurity: Not on file  Transportation Needs: Not on file  Physical Activity: Not on file  Stress: Not on file  Social Connections: Not on file  Intimate Partner Violence: Not on file      Review of Systems  Musculoskeletal:  Positive for neck pain.  All other systems reviewed and are negative.      Objective:   Physical Exam Vitals reviewed.  Constitutional:      Appearance: Normal appearance. He is normal weight.  HENT:     Head:   Neck:   Cardiovascular:     Rate and Rhythm: Normal rate and regular rhythm.     Heart sounds: Normal heart sounds.  Pulmonary:     Effort: Pulmonary effort is normal.     Breath sounds: Normal breath sounds.  Musculoskeletal:     Cervical back: Neck supple. No rigidity.  Lymphadenopathy:     Cervical: No cervical adenopathy.  Neurological:     General: No focal deficit present.     Mental Status: He is alert and oriented to person, place, and time. Mental status  is at baseline.     Cranial Nerves: No cranial nerve deficit.     Sensory: No sensory deficit.     Motor: No weakness.     Coordination: Coordination normal.     Gait: Gait normal.           Assessment & Plan:   Neck pain I believe the patient is likely having a neuropathic headache brought on by degenerative disc disease and nerve impingement in his neck.  We discussed starting a prednisone taper pack but this upsets his stomach we will try Celebrex 200 mg every 12 hours for the next 2 weeks and see if symptoms improve.  Consider imaging of the neck if persistent

## 2022-12-29 ENCOUNTER — Ambulatory Visit: Payer: No Typology Code available for payment source | Admitting: Family Medicine

## 2023-01-16 ENCOUNTER — Encounter: Payer: Self-pay | Admitting: Physician Assistant

## 2023-01-16 ENCOUNTER — Telehealth: Payer: No Typology Code available for payment source | Admitting: Physician Assistant

## 2023-01-16 DIAGNOSIS — J069 Acute upper respiratory infection, unspecified: Secondary | ICD-10-CM

## 2023-01-16 MED ORDER — PREDNISONE 20 MG PO TABS: 40.0000 mg | ORAL_TABLET | Freq: Every day | ORAL | 0 refills | Status: DC

## 2023-01-16 NOTE — Progress Notes (Signed)
Virtual Visit Consent   PIOTR Hernandez, you are scheduled for a virtual visit with a [facility] provider today. Just as with appointments in the office, your consent must be obtained to participate. Your consent will be active for this visit and any virtual visit you may have with one of our providers in the next 365 days. If you have a MyChart account, a copy of this consent can be sent to you electronically.  As this is a virtual visit, video technology does not allow for your provider to perform a traditional examination. This may limit your provider's ability to fully assess your condition. If your provider identifies any concerns that need to be evaluated in person or the need to arrange testing (such as labs, EKG, etc.), we will make arrangements to do so. Although advances in technology are sophisticated, we cannot ensure that it will always work on either your end or our end. If the connection with a video visit is poor, the visit may have to be switched to a telephone visit. With either a video or telephone visit, we are not always able to ensure that we have a secure connection.  By engaging in this virtual visit, you consent to the provision of healthcare and authorize for your insurance to be billed (if applicable) for the services provided during this visit. Depending on your insurance coverage, you may receive a charge related to this service.  I need to obtain your verbal consent now. Are you willing to proceed with your visit today? CAROLL MCGATH has provided verbal consent on 01/16/2023 for a virtual visit (video or telephone). Kent Hernandez, [place]  Date: 01/16/2023 8:28 AM  Virtual Visit via Video Note   I, Kent Hernandez, connected with  ANDR… BRADING  ([id number], 01/04/1986) on 01/16/23 at  8:15 AM EST by a video-enabled telemedicine application and verified that I am speaking with the correct person using two identifiers.  Location: Patient: Virtual Visit Location  Patient: Home Provider: Virtual Visit Location Provider: Home Office   I discussed the limitations of evaluation and management by telemedicine and the availability of in person appointments. The patient expressed understanding and agreed to proceed.    History of Present Illness: Kent Hernandez is a 37 y.o. who identifies as a male who was assigned male at birth, and is being seen today for URI symptoms starting 3 days ago. Notes initially with mild sore throat and head congestion along with aches and chills that have resolved. As of yesterday with some increased odynophagia. Some chills without fever. General malaise. Some nasal congestion. Denies any significant chest congestion or cough. HR right at 100-105 over the past couple of days. Denies known sick contact. Denies any tonsillar swelling or exudate.  OTC -- Tylenol.   Home COVID testing 3/4 -- negative.   HPI: HPI  Problems:  Patient Active Problem List   Diagnosis Date Noted   Palpitations 04/12/2021   Fatty liver disease, nonalcoholic    Transaminitis 07/11/2020   Hypercalcemia 07/11/2020   Pneumonia due to COVID-19 virus 07/10/2020   Hyperlipidemia    Hypertension    DDD (degenerative disc disease), cervical 08/09/2016   Skin lesion 08/04/2013   Allergic rhinitis    GERD (gastroesophageal reflux disease) 07/11/2011   Dysphagia, unspecified(787.20) 06/12/2011   Esophageal reflux 06/12/2011    Allergies: No Known Allergies Medications:  Current Outpatient Medications:    predniSONE (DELTASONE) 20 MG tablet, Take 2 tablets (40 mg total) by mouth  daily with breakfast., Disp: 6 tablet, Rfl: 0   albuterol (VENTOLIN HFA) 108 (90 Base) MCG/ACT inhaler, Inhale 2 puffs into the lungs every 6 (six) hours as needed for wheezing or shortness of breath., Disp: 1 each, Rfl: 0   ALPRAZolam (XANAX) 0.5 MG tablet, Take 1 tablet (0.5 mg total) by mouth 3 (three) times daily as needed., Disp: 30 tablet, Rfl: 0   ascorbic acid (VITAMIN C)  500 MG tablet, Take 1 tablet (500 mg total) by mouth daily., Disp: , Rfl:    celecoxib (CELEBREX) 200 MG capsule, Take 1 capsule (200 mg total) by mouth 2 (two) times daily., Disp: 60 capsule, Rfl: 0   cetirizine (ZYRTEC) 10 MG tablet, Take 10 mg by mouth daily., Disp: , Rfl:    cyclobenzaprine (FLEXERIL) 10 MG tablet, Take 1 tablet (10 mg total) by mouth 3 (three) times daily as needed for muscle spasms., Disp: 30 tablet, Rfl: 0   fluticasone (FLONASE) 50 MCG/ACT nasal spray, Place 1 spray into both nostrils daily., Disp: , Rfl:    lansoprazole (PREVACID) 30 MG capsule, Take 30 mg by mouth daily at 12 noon., Disp: , Rfl:    testosterone cypionate (DEPOTESTOSTERONE CYPIONATE) 200 MG/ML injection, INJECT 1 ML (200 MG TOTAL) INTO THE MUSCLE EVERY 14 DAYS, Disp: 10 mL, Rfl: 0  Observations/Objective: Patient is well-developed, well-nourished in no acute distress.  Resting comfortably at home.  Head is normocephalic, atraumatic.  No labored breathing. Speech is clear and coherent with logical content.  Patient is alert and oriented at baseline.    Assessment and Plan: 1. Viral URI - predniSONE (DELTASONE) 20 MG tablet; Take 2 tablets (40 mg total) by mouth daily with breakfast.  Dispense: 6 tablet; Refill: 0  Question if a flu-like illness giving body aches, chills, fever at onset. COVID negative. Is outside of flu antiviral window. Most constitutional symptoms resolved. Mainly with sore throat left. Supportive measures and OTC medications reviewed. Will give short (3-day) course of prednisone for pharyngeal inflammation and pain. Strict follow-up precautions reviewed. Work note declined (works remotely).  Follow Up Instructions: I discussed the assessment and treatment plan with the patient. The patient was provided an opportunity to ask questions and all were answered. The patient agreed with the plan and demonstrated an understanding of the instructions.  A copy of instructions were sent to  the patient via MyChart unless otherwise noted below.   The patient was advised to call back or seek an in-person evaluation if the symptoms worsen or if the condition fails to improve as anticipated.  Time:  I spent 10 minutes with the patient via telehealth technology discussing the above problems/concerns.    Kent Rio, PA-C

## 2023-01-16 NOTE — Patient Instructions (Signed)
  Kent Hernandez, thank you for joining Kent Rio, PA-C for today's virtual visit.  While this provider is not your primary care provider (PCP), if your PCP is located in our provider database this encounter information will be shared with them immediately following your visit.   [facility] account gives you access to today's visit and all your visits, tests, and labs performed at [facility] " click here if you don't have a [facility] account or go to [email or web address]  Consent: (Patient) Kent Hernandez provided verbal consent for this virtual visit at the beginning of the encounter.  Current Medications:  Current Outpatient Medications:    albuterol (VENTOLIN HFA) 108 (90 Base) MCG/ACT inhaler, Inhale 2 puffs into the lungs every 6 (six) hours as needed for wheezing or shortness of breath., Disp: 1 each, Rfl: 0   ALPRAZolam (XANAX) 0.5 MG tablet, Take 1 tablet (0.5 mg total) by mouth 3 (three) times daily as needed., Disp: 30 tablet, Rfl: 0   ascorbic acid (VITAMIN C) 500 MG tablet, Take 1 tablet (500 mg total) by mouth daily., Disp: , Rfl:    celecoxib (CELEBREX) 200 MG capsule, Take 1 capsule (200 mg total) by mouth 2 (two) times daily., Disp: 60 capsule, Rfl: 0   cetirizine (ZYRTEC) 10 MG tablet, Take 10 mg by mouth daily., Disp: , Rfl:    cyclobenzaprine (FLEXERIL) 10 MG tablet, Take 1 tablet (10 mg total) by mouth 3 (three) times daily as needed for muscle spasms., Disp: 30 tablet, Rfl: 0   fluticasone (FLONASE) 50 MCG/ACT nasal spray, Place 1 spray into both nostrils daily., Disp: , Rfl:    lansoprazole (PREVACID) 30 MG capsule, Take 30 mg by mouth daily at 12 noon., Disp: , Rfl:    testosterone cypionate (DEPOTESTOSTERONE CYPIONATE) 200 MG/ML injection, INJECT 1 ML (200 MG TOTAL) INTO THE MUSCLE EVERY 14 DAYS, Disp: 10 mL, Rfl: 0   Medications ordered in this encounter:  No orders of the defined types were placed in this encounter.     *If you need refills on other medications prior to your next appointment, please contact your pharmacy*  Follow-Up: Call back or seek an in-person evaluation if the symptoms worsen or if the condition fails to improve as anticipated.  [facility] [phone]  Other Instructions Please keep well-hydrated and get plenty of rest. Tylenol OTC for throat pain. Take the prednisone as directed. Start salt-water gargles and drinking warm liquids.  Can use OTC Claritin or Zyrtec as well. If not resolving or any new/worsening symptoms after next couple of days, please let me know.    If you have been instructed to have an in-person evaluation today at a local Urgent Care facility, please use the link below. It will take you to a list of all of our available [facility], including address, phone number and hours of operation. Please do not delay care.  [facility]  If you or a family member do not have a primary care provider, use the link below to schedule a visit and establish care. When you choose a [facility] primary care physician or advanced practice provider, you gain a long-term partner in health. Find a Primary Care Provider  Learn more about [facility] in-office and virtual care options: [facility]

## 2023-01-19 ENCOUNTER — Ambulatory Visit: Payer: No Typology Code available for payment source | Admitting: Family Medicine

## 2023-01-19 ENCOUNTER — Encounter: Payer: Self-pay | Admitting: Family Medicine

## 2023-01-19 VITALS — BP 142/96 | HR 85 | Temp 98.1°F | Ht 69.0 in | Wt 178.8 lb

## 2023-01-19 DIAGNOSIS — R000 Tachycardia, unspecified: Secondary | ICD-10-CM

## 2023-01-19 DIAGNOSIS — J029 Acute pharyngitis, unspecified: Secondary | ICD-10-CM

## 2023-01-19 DIAGNOSIS — R5383 Other fatigue: Secondary | ICD-10-CM

## 2023-01-19 MED ORDER — DILTIAZEM HCL 30 MG PO TABS
30.0000 mg | ORAL_TABLET | Freq: Four times a day (QID) | ORAL | 0 refills | Status: DC | PRN
Start: 2023-01-19 — End: 2023-01-25

## 2023-01-19 NOTE — Progress Notes (Signed)
Subjective:    Patient ID: Kent Hernandez, male    DOB: [birth year], 37 y.o.   MRN: [id number]  Sore Throat    Patient had an episode Saturday.  He was cutting bushes in his yard.  His heart rate got up to 170.  He was very short of breath.  He states however that his heart rate did not come down.  Despite resting he continued to have a rapid heart rate.  He states that for the rest of the day his heart rate was greater than 100.  He had a cardiac monitor last year that showed episodes of SVT.  He tried metoprolol however he hated the medication because of fatigue and erectile dysfunction and poor libido.  Then on Tuesday he developed a sore throat.  He was seen by another physician and was given prednisone.  His strep test here today is negative however he has significant erythema in the posterior oropharynx.  He complains of fatigue and tiredness.  I suspect that the patient may have EBV or cytomegalovirus.  He denies any abdominal pain and there is no hepatosplenomegaly on exam Past Medical History:  Diagnosis Date   Allergic rhinitis    DDD (degenerative disc disease), cervical 2015   Esophageal reflux    Fatty liver disease, nonalcoholic    GERD (gastroesophageal reflux disease)    Phreesia 04/12/2020   H. pylori infection    Hyperlipidemia    Phreesia 04/12/2020   Hypertension    Phreesia 04/12/2020   Liver hemangioma    No past surgical history on file. Current Outpatient Medications on File Prior to Visit  Medication Sig Dispense Refill   albuterol (VENTOLIN HFA) 108 (90 Base) MCG/ACT inhaler Inhale 2 puffs into the lungs every 6 (six) hours as needed for wheezing or shortness of breath. 1 each 0   ALPRAZolam (XANAX) 0.5 MG tablet Take 1 tablet (0.5 mg total) by mouth 3 (three) times daily as needed. 30 tablet 0   ascorbic acid (VITAMIN C) 500 MG tablet Take 1 tablet (500 mg total) by mouth daily.     celecoxib (CELEBREX) 200 MG capsule Take 1 capsule (200 mg total) by mouth 2  (two) times daily. 60 capsule 0   cetirizine (ZYRTEC) 10 MG tablet Take 10 mg by mouth daily.     cyclobenzaprine (FLEXERIL) 10 MG tablet Take 1 tablet (10 mg total) by mouth 3 (three) times daily as needed for muscle spasms. 30 tablet 0   fluticasone (FLONASE) 50 MCG/ACT nasal spray Place 1 spray into both nostrils daily.     lansoprazole (PREVACID) 30 MG capsule Take 30 mg by mouth daily at 12 noon.     predniSONE (DELTASONE) 20 MG tablet Take 2 tablets (40 mg total) by mouth daily with breakfast. 6 tablet 0   testosterone cypionate (DEPOTESTOSTERONE CYPIONATE) 200 MG/ML injection INJECT 1 ML (200 MG TOTAL) INTO THE MUSCLE EVERY 14 DAYS 10 mL 0   No current facility-administered medications on file prior to visit.   No Known Allergies Social History   Socioeconomic History   Marital status: Married    Spouse name: Not on file   Number of children: Not on file   Years of education: Not on file   Highest education level: Not on file  Occupational History   Occupation: Dance movement psychotherapist    Employer: [employer]  Tobacco Use   Smoking status: Former    Types: Cigarettes   Smokeless tobacco: Former    Quit  date: 02/12/2011   Tobacco comments:    Light use  Substance and Sexual Activity   Alcohol use: Yes    Comment: occ   Drug use: No   Sexual activity: Yes  Other Topics Concern   Not on file  Social History Narrative   Not on file   Social Determinants of Health   Financial Resource Strain: Not on file  Food Insecurity: Not on file  Transportation Needs: Not on file  Physical Activity: Not on file  Stress: Not on file  Social Connections: Not on file  Intimate Partner Violence: Not on file   Family History  Problem Relation Age of Onset   Diabetes Maternal Uncle    Diabetes Paternal Grandfather    Heart disease Paternal Grandfather    Hypertension Father    Kidney disease Father    Cancer Maternal Grandmother    Heart disease Paternal Grandmother   '   Review  of Systems     Objective:   Physical Exam Vitals reviewed.  Constitutional:      General: He is not in acute distress.    Appearance: He is normal weight. He is not ill-appearing, toxic-appearing or diaphoretic.  HENT:     Head: Normocephalic and atraumatic.     Right Ear: Tympanic membrane, ear canal and external ear normal. There is no impacted cerumen.     Left Ear: Tympanic membrane, ear canal and external ear normal. There is no impacted cerumen.     Nose: Nose normal. No congestion or rhinorrhea.     Mouth/Throat:     Mouth: Mucous membranes are moist.     Pharynx: Posterior oropharyngeal erythema present.  Eyes:     Extraocular Movements: Extraocular movements intact.     Conjunctiva/sclera: Conjunctivae normal.     Pupils: Pupils are equal, round, and reactive to light.  Neck:     Vascular: No carotid bruit.  Cardiovascular:     Rate and Rhythm: Normal rate and regular rhythm.     Pulses: Normal pulses.     Heart sounds: Normal heart sounds. No murmur heard.    No friction rub. No gallop.  Pulmonary:     Effort: Pulmonary effort is normal. No respiratory distress.     Breath sounds: Normal breath sounds. No stridor. No wheezing, rhonchi or rales.  Abdominal:     General: Abdomen is flat. Bowel sounds are normal. There is no distension.     Palpations: Abdomen is soft.     Tenderness: There is no abdominal tenderness. There is no guarding or rebound.     Hernia: No hernia is present.  Musculoskeletal:     Cervical back: Normal range of motion and neck supple. No rigidity or tenderness.     Right lower leg: No edema.     Left lower leg: No edema.  Lymphadenopathy:     Cervical: No cervical adenopathy.  Skin:    General: Skin is warm.     Coloration: Skin is not jaundiced or pale.     Findings: No bruising, erythema, lesion or rash.  Neurological:     General: No focal deficit present.     Mental Status: He is alert and oriented to person, place, and time. Mental  status is at baseline.     Cranial Nerves: No cranial nerve deficit.     Sensory: No sensory deficit.     Motor: No weakness.     Coordination: Coordination normal.     Gait: Gait normal.  Deep Tendon Reflexes: Reflexes normal.  Psychiatric:        Mood and Affect: Mood normal.        Behavior: Behavior normal.        Thought Content: Thought content normal.        Judgment: Judgment normal.           Assessment & Plan:  Sorethroat - Plan: STREP GROUP A AG, W/REFLEX TO CULT  Fatigue, unspecified type - Plan: CBC with Differential/Platelet, COMPLETE METABOLIC PANEL WITH GFR, TSH  Heart rate fast Patient is heart is in normal sinus rhythm today.  I question if the patient had an episode of SVT and then persistent tachycardia thereafter due to anxiety or whether his tachycardia was due to deconditioning.  At the present time he is in normal sinus rhythm.  He did poorly on metoprolol before.  I will give the patient Cardizem 30 mg.  He can take 1 tablet every 4 hours as needed for rapid heart rate.  I encouraged him only to use this if we feel that his heart rate is elevated for no specific reason such as SVT.  Hopefully he will not require this medication.  If he does find that he is needing to use the medication I would recommend repeating his cardiac monitor to determine the source of the arrhythmia.  Meanwhile we will check a CBC a CMP and a TSH.  I suspect that he has a viral source of his sore throat and I suspect CMV versus EBV given his normal strep test.  Recommended tincture of time.

## 2023-01-20 LAB — CBC WITH DIFFERENTIAL/PLATELET
Absolute Monocytes: 968 cells/uL — ABNORMAL HIGH (ref 200–950)
Basophils Absolute: 66 cells/uL (ref 0–200)
Basophils Relative: 0.6 %
Eosinophils Absolute: 121 cells/uL (ref 15–500)
Eosinophils Relative: 1.1 %
HCT: 45.4 % (ref 38.5–50.0)
Hemoglobin: 15.5 g/dL (ref 13.2–17.1)
Lymphs Abs: 3091 cells/uL (ref 850–3900)
MCH: 30.1 pg (ref 27.0–33.0)
MCHC: 34.1 g/dL (ref 32.0–36.0)
MCV: 88.2 fL (ref 80.0–100.0)
MPV: 10 fL (ref 7.5–12.5)
Monocytes Relative: 8.8 %
Neutro Abs: 6754 cells/uL (ref 1500–7800)
Neutrophils Relative %: 61.4 %
Platelets: 375 10*3/uL (ref 140–400)
RBC: 5.15 10*6/uL (ref 4.20–5.80)
RDW: 12.5 % (ref 11.0–15.0)
Total Lymphocyte: 28.1 %
WBC: 11 10*3/uL — ABNORMAL HIGH (ref 3.8–10.8)

## 2023-01-20 LAB — COMPLETE METABOLIC PANEL WITH GFR
AG Ratio: 1.6 (calc) (ref 1.0–2.5)
ALT: 18 U/L (ref 9–46)
AST: 16 U/L (ref 10–40)
Albumin: 4.4 g/dL (ref 3.6–5.1)
Alkaline phosphatase (APISO): 67 U/L (ref 36–130)
BUN: 12 mg/dL (ref 7–25)
CO2: 27 mmol/L (ref 20–32)
Calcium: 9.2 mg/dL (ref 8.6–10.3)
Chloride: 102 mmol/L (ref 98–110)
Creat: 0.96 mg/dL (ref 0.60–1.26)
Globulin: 2.7 g/dL (calc) (ref 1.9–3.7)
Glucose, Bld: 84 mg/dL (ref 65–99)
Potassium: 3.9 mmol/L (ref 3.5–5.3)
Sodium: 142 mmol/L (ref 135–146)
Total Bilirubin: 0.7 mg/dL (ref 0.2–1.2)
Total Protein: 7.1 g/dL (ref 6.1–8.1)
eGFR: 104 mL/min/{1.73_m2} (ref >=60)

## 2023-01-20 LAB — STREP GROUP A AG, W/REFLEX TO CULT: RESULT STREP GRP A AG: DETECTED — AB

## 2023-01-20 LAB — TSH: TSH: 3.3 mIU/L (ref 0.40–4.50)

## 2023-01-22 ENCOUNTER — Other Ambulatory Visit: Payer: Self-pay | Admitting: Family Medicine

## 2023-01-22 MED ORDER — AMOXICILLIN 875 MG PO TABS: 875.0000 mg | ORAL_TABLET | Freq: Two times a day (BID) | ORAL | 0 refills | Status: AC

## 2023-01-24 ENCOUNTER — Encounter: Payer: Self-pay | Admitting: Family Medicine

## 2023-01-24 ENCOUNTER — Telehealth: Payer: Self-pay

## 2023-01-24 NOTE — Telephone Encounter (Signed)
My Chart message from patient:  Hi- Amoxicillin usually doesn't sit right with me. I feel much better. Still have really dry mouth. Do I need to take the full 10 days or can I just take 5 since I feel much better?   I advised the patient that ususally the course should be finished, is it okay for him to stop? Thank you!

## 2023-01-25 ENCOUNTER — Other Ambulatory Visit: Payer: Self-pay | Admitting: Family Medicine

## 2023-01-25 MED ORDER — DILTIAZEM HCL 30 MG PO TABS: 30.0000 mg | ORAL_TABLET | Freq: Four times a day (QID) | ORAL | 0 refills | Status: DC | PRN

## 2023-02-02 ENCOUNTER — Other Ambulatory Visit: Payer: Self-pay

## 2023-02-02 DIAGNOSIS — R000 Tachycardia, unspecified: Secondary | ICD-10-CM

## 2023-02-02 DIAGNOSIS — I10 Essential (primary) hypertension: Secondary | ICD-10-CM

## 2023-02-02 MED ORDER — DILTIAZEM HCL 30 MG PO TABS: 30.0000 mg | ORAL_TABLET | Freq: Four times a day (QID) | ORAL | 3 refills | Status: DC | PRN

## 2023-02-02 NOTE — Telephone Encounter (Signed)
Prescription Request  02/02/2023  LOV: 07/04/22  What is the name of the medication or equipment? diltiazem (CARDIZEM) 30 MG tablet [id number]  Have you contacted your pharmacy to request a refill? Yes   Which pharmacy would you like this sent to?  [id number] - [place], [place] - [address] [place] [address] Phone: [phone] Fax: [phone]    Patient notified that their request is being sent to the clinical staff for review and that they should receive a response within 2 business days.   Please advise at [facility]

## 2023-02-07 NOTE — Telephone Encounter (Signed)
Requested medication (s) are due for refill today: na  Requested medication (s) are on the active medication list: yes  Last refill:  02/02/23 #30 3 refills  Future visit scheduled: yes in 1 month  Notes to clinic:  signed 02/02/23. Last OV 01/19/23. Pharmacy requesting 90 day supply. Do you want to refill for 90 days?     Requested Prescriptions  Pending Prescriptions Disp Refills   diltiazem (CARDIZEM) 30 MG tablet 30 tablet 3    Sig: Take 1 tablet (30 mg total) by mouth 4 (four) times daily as needed (fast heart rate).     Cardiovascular: Calcium Channel Blockers 3 Failed - 02/07/2023  3:57 PM      Failed - Last BP in normal range    BP Readings from Last 1 Encounters:  01/19/23 (!) 142/96         Failed - Valid encounter within last 6 months    Recent Outpatient Visits           9 months ago Hypogonadism in male   [facility] Pickard, Cammie Mcgee, MD   1 year ago Other chest pain   [place] [facility] Susy Frizzle, MD   1 year ago Fatigue, unspecified type   [place] Susy Frizzle, MD   1 year ago Upper respiratory tract infection, unspecified type   [facility] Eulogio Bear, NP   1 year ago General medical exam   [facility] Susy Frizzle, MD       Future Appointments             In 1 month Pickard, Cammie Mcgee, MD [facility]            Passed - ALT in normal range and within 360 days    ALT  Date Value Ref Range Status  01/19/2023 18 9 - 46 U/L Final         Passed - AST in normal range and within 360 days    AST  Date Value Ref Range Status  01/19/2023 16 10 - 40 U/L Final         Passed - Cr in normal range and within 360 days    Creat  Date Value Ref Range Status  01/19/2023 0.96 0.60 - 1.26 mg/dL Final         Passed - Last Heart Rate in normal range    Pulse Readings from Last 1 Encounters:  01/19/23 85

## 2023-02-07 NOTE — Addendum Note (Signed)
Addended by: Valli Glance F on: 02/07/2023 03:57 PM   Modules accepted: Orders

## 2023-02-07 NOTE — Telephone Encounter (Signed)
Pharmacy sent request for 90 day refill of  diltiazem (CARDIZEM) 30 MG tablet   Pharmacy:  [id number] - [place], [place] - [address] [facility] [place], [place] [address] Phone: [phone]  Fax: [phone] DEA #: [id number]    Please advise pharmacist.

## 2023-03-05 ENCOUNTER — Encounter: Payer: Self-pay | Admitting: Family Medicine

## 2023-03-06 ENCOUNTER — Ambulatory Visit: Payer: No Typology Code available for payment source | Admitting: Family Medicine

## 2023-03-06 ENCOUNTER — Encounter: Payer: Self-pay | Admitting: Family Medicine

## 2023-03-06 VITALS — BP 132/82 | HR 96 | Temp 98.0°F | Ht 69.0 in | Wt 178.0 lb

## 2023-03-06 DIAGNOSIS — G4453 Primary thunderclap headache: Secondary | ICD-10-CM | POA: Diagnosis not present

## 2023-03-06 MED ORDER — GABAPENTIN 300 MG PO CAPS: 300.0000 mg | ORAL_CAPSULE | Freq: Three times a day (TID) | ORAL | 3 refills | Status: AC | PRN

## 2023-03-06 NOTE — Progress Notes (Signed)
Subjective:    Patient ID: Kent Hernandez, male    DOB: [birth year], 37 y.o.   MRN: [id number]  Patient reports a pulsatile headache in his right occiput.  Patient states he feels a sharp sudden knifelike pain in the back of his brain.  It comes without warning.  It is intense and almost dropped into his knees.  It lasts a few seconds and then it passes.  It occurred probably 100 times yesterday.  The skull is actually sensitive and tender to touch in that area but there is no visible rash.  He had an MRI of the brain in October which showed the right cerebellar tonsil protruding 5 mm below the foramen magnum but otherwise was negative for any intracranial lesion.  He denies any headache with Valsalva.  He denies any neurologic deficit.  However the headache is getting worse.  Past Medical History:  Diagnosis Date   Allergic rhinitis    DDD (degenerative disc disease), cervical 2015   Esophageal reflux    Fatty liver disease, nonalcoholic    GERD (gastroesophageal reflux disease)    [facility]rees[facility] 04/12/2020   H. pylori infection    Hyperlipidemia    [facility] 04/12/2020   Hypertension    [facility]ia 04/12/2020   Liver hemangioma    No past surgical history on file. Current Outpatient Medications on File Prior to Visit  Medication Sig Dispense Refill   albuterol (VENTOLIN HFA) 108 (90 Base) MCG/ACT inhaler Inhale 2 puffs into the lungs every 6 (six) hours as needed for wheezing or shortness of breath. 1 each 0   ALPRAZolam (XANAX) 0.5 MG tablet Take 1 tablet (0.5 mg total) by mouth 3 (three) times daily as needed. 30 tablet 0   ascorbic acid (VITAMIN C) 500 MG tablet Take 1 tablet (500 mg total) by mouth daily.     celecoxib (CELEBREX) 200 MG capsule Take 1 capsule (200 mg total) by mouth 2 (two) times daily. 60 capsule 0   cetirizine (ZYRTEC) 10 MG tablet Take 10 mg by mouth daily.     cyclobenzaprine (FLEXERIL) 10 MG tablet Take 1 tablet (10 mg total) by mouth 3 (three) times daily as needed for  muscle spasms. 30 tablet 0   diltiazem (CARDIZEM) 30 MG tablet Take 1 tablet (30 mg total) by mouth 4 (four) times daily as needed (fast heart rate). 30 tablet 3   fluticasone (FLONASE) 50 MCG/ACT nasal spray Place 1 spray into both nostrils daily.     lansoprazole (PREVACID) 30 MG capsule Take 30 mg by mouth daily at 12 noon.     predniSONE (DELTASONE) 20 MG tablet Take 2 tablets (40 mg total) by mouth daily with breakfast. 6 tablet 0   testosterone cypionate (DEPOTESTOSTERONE CYPIONATE) 200 MG/ML injection INJECT 1 ML (200 MG TOTAL) INTO THE MUSCLE EVERY 14 DAYS 10 mL 0   No current facility-administered medications on file prior to visit.   No Known Allergies Social History   Socioeconomic History   Marital status: Married    Spouse name: Not on file   Number of children: Not on file   Years of education: Not on file   Highest education level: Not on file  Occupational History   Occupation: Social research officer, government    Employer: [employer]  Tobacco Use   Smoking status: Former    Types: Cigarettes   Smokeless tobacco: Former    Quit date: 02/12/2011   Tobacco comments:    Light use  Substance and Sexual Activity  Alcohol use: Yes    Comment: occ   Drug use: No   Sexual activity: Yes  Other Topics Concern   Not on file  Social History Narrative   Not on file   Social Determinants of Health   Financial Resource Strain: Not on file  Food Insecurity: Not on file  Transportation Needs: Not on file  Physical Activity: Not on file  Stress: Not on file  Social Connections: Not on file  Intimate Partner Violence: Not on file      Review of Systems  Musculoskeletal:  Positive for neck pain.  All other systems reviewed and are negative.      Objective:   Physical Exam Vitals reviewed.  Constitutional:      Appearance: Normal appearance. He is normal weight.  HENT:     Head: Normocephalic and atraumatic.   Eyes:     General: No visual field deficit. Cardiovascular:      Rate and Rhythm: Normal rate and regular rhythm.     Heart sounds: Normal heart sounds.  Pulmonary:     Effort: Pulmonary effort is normal.     Breath sounds: Normal breath sounds.  Musculoskeletal:     Cervical back: Neck supple. No rigidity.  Lymphadenopathy:     Cervical: No cervical adenopathy.  Neurological:     General: No focal deficit present.     Mental Status: He is alert and oriented to person, place, and time. Mental status is at baseline.     Cranial Nerves: No cranial nerve deficit, dysarthria or facial asymmetry.     Sensory: Sensation is intact. No sensory deficit.     Motor: Motor function is intact. No weakness or pronator drift.     Coordination: Coordination is intact. Romberg sign negative. Coordination normal.     Gait: Gait is intact. Gait normal.           Assessment & Plan:  Thunderclap headache - Plan: MR Angiogram Head Wo Contrast Schedule patient for MRA of the brain given the sudden thunderclap pulsatile headache.  Rule out aneurysm.  I suspect that this is more likely a pinched nerve and I will try treating the patient simultaneously with gabapentin 300 mg p.o. 3 times daily as needed headache.  Seek medical attention immediately if the headache worsens.

## 2023-03-09 ENCOUNTER — Encounter: Payer: Self-pay | Admitting: Family Medicine

## 2023-03-09 ENCOUNTER — Ambulatory Visit (INDEPENDENT_AMBULATORY_CARE_PROVIDER_SITE_OTHER): Payer: No Typology Code available for payment source | Admitting: Family Medicine

## 2023-03-09 VITALS — BP 120/70 | HR 66 | Temp 97.9°F | Ht 69.0 in | Wt 178.8 lb

## 2023-03-09 DIAGNOSIS — E291 Testicular hypofunction: Secondary | ICD-10-CM | POA: Diagnosis not present

## 2023-03-09 DIAGNOSIS — Z0000 Encounter for general adult medical examination without abnormal findings: Secondary | ICD-10-CM

## 2023-03-09 DIAGNOSIS — Z0001 Encounter for general adult medical examination with abnormal findings: Secondary | ICD-10-CM

## 2023-03-09 MED ORDER — ALBUTEROL SULFATE HFA 108 (90 BASE) MCG/ACT IN AERS: 2.0000 | INHALATION_SPRAY | Freq: Four times a day (QID) | RESPIRATORY_TRACT | 3 refills | Status: AC | PRN

## 2023-03-09 NOTE — Progress Notes (Signed)
Subjective:    Patient ID: Kent Hernandez, male    DOB: 1986/07/17, 37 y.o.   MRN: [id number] Patient is a very pleasant 37 year old Caucasian male here today for complete physical exam.  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  Recently, I saw the patient for pulsatile right occipital headache.  That has stopped after one dose of gabapentin.  This is reassuring to me.  I suspect that the patient likely has nerve impingement in his neck causing the occipital headache.  He does have a history of degenerative disc disease in the C-spine.  He is due for tetanus shot.  The patient politely defers that.  He is not yet due for any colon cancer screening. Past Medical History:  Diagnosis Date   Allergic rhinitis    DDD (degenerative disc disease), cervical 2015   Esophageal reflux    Fatty liver disease, nonalcoholic    GERD (gastroesophageal reflux disease)    Phreesia 04/12/2020   H. pylori infection    Hyperlipidemia    [facility]reesia 04/12/2020   Hypertension    [facility]reesia 04/12/2020   Liver hemangioma    No past surgical history on file. Current Outpatient Medications on File Prior to Visit  Medication Sig Dispense Refill   albuterol (VENTOLIN HFA) 108 (90 Base) MCG/ACT inhaler Inhale 2 puffs into the lungs every 6 (six) hours as needed for wheezing or shortness of breath. 1 each 0   ALPRAZolam (XANAX) 0.5 MG tablet Take 1 tablet (0.5 mg total) by mouth 3 (three) times daily as needed. 30 tablet 0   ascorbic acid (VITAMIN C) 500 MG tablet Take 1 tablet (500 mg total) by mouth daily.     cetirizine (ZYRTEC) 10 MG tablet Take 10 mg by mouth daily.     cyclobenzaprine (FLEXERIL) 10 MG tablet Take 1 tablet (10 mg total) by mouth 3 (three) times daily as needed for muscle spasms. 30 tablet 0   diltiazem (CARDIZEM) 30 MG tablet Take 1 tablet (30 mg total) by mouth 4 (four) times daily as needed (fast heart rate). 30 tablet 3   fluticasone  (FLONASE) 50 MCG/ACT nasal spray Place 1 spray into both nostrils daily.     gabapentin (NEURONTIN) 300 MG capsule Take 1 capsule (300 mg total) by mouth 3 (three) times daily as needed (nerve pain). 90 capsule 3   lansoprazole (PREVACID) 30 MG capsule Take 30 mg by mouth daily at 12 noon.     testosterone cypionate (DEPOTESTOSTERONE CYPIONATE) 200 MG/ML injection INJECT 1 ML (200 MG TOTAL) INTO THE MUSCLE EVERY 14 DAYS 10 mL 0   No current facility-administered medications on file prior to visit.   No Known Allergies Social History   Socioeconomic History   Marital status: Married    Spouse name: Not on file   Number of children: Not on file   Years of education: Not on file   Highest education level: Not on file  Occupational History   Occupation: Store Event organiser: [employer]  Tobacco Use   Smoking status: Former    Types: Cigarettes   Smokeless tobacco: Former    Quit date: 02/12/2011   Tobacco comments:    Light use  Substance and Sexual Activity   Alcohol use: Yes    Comment: occ   Drug use: No   Sexual activity: Yes  Other Topics Concern   Not on file  Social History Narrative   Not on file  Social Determinants of Health   Financial Resource Strain: Not on file  Food Insecurity: Not on file  Transportation Needs: Not on file  Physical Activity: Not on file  Stress: Not on file  Social Connections: Not on file  Intimate Partner Violence: Not on file      Review of Systems  Cardiovascular:  Positive for palpitations.  All other systems reviewed and are negative.      Objective:   Physical Exam Vitals reviewed.  Constitutional:      Appearance: Normal appearance. He is normal weight.  Eyes:     Conjunctiva/sclera: Conjunctivae normal.  Neck:     Thyroid: No thyroid mass, thyromegaly or thyroid tenderness.  Cardiovascular:     Rate and Rhythm: Normal rate and regular rhythm.     Pulses: Normal pulses.     Heart sounds: Normal heart  sounds. No murmur heard.    No friction rub. No gallop.  Pulmonary:     Effort: Pulmonary effort is normal. No respiratory distress.     Breath sounds: Normal breath sounds. No stridor. No wheezing, rhonchi or rales.  Abdominal:     General: Abdomen is flat. Bowel sounds are normal. There is no distension.     Palpations: Abdomen is soft. There is no mass.     Tenderness: There is no abdominal tenderness. There is no guarding or rebound.     Hernia: No hernia is present.  Musculoskeletal:     Right lower leg: No edema.     Left lower leg: No edema.  Neurological:     General: No focal deficit present.     Mental Status: He is alert and oriented to person, place, and time. Mental status is at baseline.     Cranial Nerves: No cranial nerve deficit.     Sensory: No sensory deficit.     Motor: No weakness.     Coordination: Coordination normal.     Gait: Gait normal.     Deep Tendon Reflexes: Reflexes normal.           Assessment & Plan:  General medical exam - Plan: albuterol (VENTOLIN HFA) 108 (90 Base) MCG/ACT inhaler  Hypogonadism in male - Plan: PSA, Lipid panel, Testosterone Total,Free,Bio, Males Patient recently had a CBC which showed a white count of 11.  However this was drawn while he had strep throat.  His hemoglobin was normal so I am not concerned about polycythemia on testosterone replacement.  I will check a testosterone level along with a PSA given the fact that he is on testosterone replacement.  He had a CMP that was normal including his liver function test and his glucose.  He had a TSH that was normal.  He is due to check his cholesterol.  Patient is due for tetanus shot which she politely declines today.  He is welcome to return at any time for the tetanus shot if he changes his mind.

## 2023-03-10 LAB — LIPID PANEL
Cholesterol: 232 mg/dL — ABNORMAL HIGH (ref <200)
HDL: 43 mg/dL (ref >=40)
LDL Cholesterol (Calc): 151 mg/dL (calc) — ABNORMAL HIGH
Non-HDL Cholesterol (Calc): 189 mg/dL (calc) — ABNORMAL HIGH (ref <130)
Total CHOL/HDL Ratio: 5.4 (calc) — ABNORMAL HIGH (ref <5.0)
Triglycerides: 250 mg/dL — ABNORMAL HIGH (ref <150)

## 2023-03-10 LAB — PSA: PSA: 0.54 ng/mL (ref <=4.00)

## 2023-03-10 LAB — TESTOSTERONE TOTAL,FREE,BIO, MALES
Albumin: 4.7 g/dL (ref 3.6–5.1)
Sex Hormone Binding: 26 nmol/L (ref 10–50)
Testosterone: 147 ng/dL — ABNORMAL LOW (ref 250–827)

## 2023-03-14 ENCOUNTER — Encounter: Payer: Self-pay | Admitting: Family Medicine

## 2023-03-15 ENCOUNTER — Encounter: Payer: Self-pay | Admitting: Family Medicine

## 2023-03-16 ENCOUNTER — Ambulatory Visit
Admission: RE | Admit: 2023-03-16 | Discharge: 2023-03-16 | Disposition: A | Discharge status: Home / Self Care | Payer: No Typology Code available for payment source | Source: Ambulatory Visit | Attending: Family Medicine | Admitting: Family Medicine

## 2023-03-16 DIAGNOSIS — G4453 Primary thunderclap headache: Secondary | ICD-10-CM

## 2023-03-19 ENCOUNTER — Other Ambulatory Visit: Payer: Self-pay | Admitting: Family Medicine

## 2023-03-20 ENCOUNTER — Encounter: Payer: Self-pay | Admitting: Family Medicine

## 2023-03-20 MED ORDER — ALPRAZOLAM 0.5 MG PO TABS: 0.5000 mg | ORAL_TABLET | Freq: Three times a day (TID) | ORAL | 0 refills | Status: DC | PRN

## 2023-03-26 ENCOUNTER — Encounter: Payer: Self-pay | Admitting: Family Medicine

## 2023-04-25 ENCOUNTER — Encounter: Payer: Self-pay | Admitting: Family Medicine

## 2023-04-27 ENCOUNTER — Other Ambulatory Visit: Payer: Self-pay

## 2023-04-27 DIAGNOSIS — E782 Mixed hyperlipidemia: Secondary | ICD-10-CM

## 2023-04-27 DIAGNOSIS — K760 Fatty (change of) liver, not elsewhere classified: Secondary | ICD-10-CM

## 2023-04-27 MED ORDER — ROSUVASTATIN CALCIUM 20 MG PO TABS: 20.0000 mg | ORAL_TABLET | Freq: Every day | ORAL | 1 refills | Status: DC

## 2023-04-27 MED ORDER — ROSUVASTATIN CALCIUM 10 MG PO TABS: 10.0000 mg | ORAL_TABLET | Freq: Every day | ORAL | 1 refills | Status: DC

## 2023-05-01 ENCOUNTER — Other Ambulatory Visit: Payer: Self-pay | Admitting: Family Medicine

## 2023-05-03 MED ORDER — TESTOSTERONE CYPIONATE 200 MG/ML IM SOLN: 200.0000 mg | INTRAMUSCULAR | 0 refills | Status: DC

## 2023-06-03 ENCOUNTER — Encounter: Payer: Self-pay | Admitting: Family Medicine

## 2023-06-04 ENCOUNTER — Other Ambulatory Visit: Payer: Self-pay | Admitting: Family Medicine

## 2023-06-04 MED ORDER — TESTOSTERONE 1.62 % TD GEL
2.0000 | Freq: Every day | TRANSDERMAL | 0 refills | Status: DC
Start: 2023-06-04 — End: 2023-07-05

## 2023-06-26 ENCOUNTER — Other Ambulatory Visit: Payer: Self-pay | Admitting: Family Medicine

## 2023-07-04 ENCOUNTER — Encounter: Payer: Self-pay | Admitting: Family Medicine

## 2023-07-05 ENCOUNTER — Other Ambulatory Visit: Payer: Self-pay | Admitting: Family Medicine

## 2023-07-05 ENCOUNTER — Telehealth: Payer: Self-pay

## 2023-07-05 MED ORDER — CYCLOBENZAPRINE HCL 10 MG PO TABS: 10.0000 mg | ORAL_TABLET | Freq: Three times a day (TID) | ORAL | 0 refills | Status: DC | PRN

## 2023-07-05 MED ORDER — TESTOSTERONE 1.62 % TD GEL: 2.0000 | Freq: Every day | TRANSDERMAL | 0 refills | Status: DC

## 2023-07-05 NOTE — Telephone Encounter (Signed)
My Chart message from patient:  Hi! I haven't seen my refill go into the pharmacy for my testosterone gel and muscle relaxers. Can this please be sent in?   Last RF on Testosterone gel was 06/04/23 and on Cyclobenzaprine was 04/23/2022.  Last OV 03/09/2023.

## 2023-07-05 NOTE — Progress Notes (Signed)
flexeril

## 2023-07-09 MED ORDER — CYCLOBENZAPRINE HCL 10 MG PO TABS: 10.0000 mg | ORAL_TABLET | Freq: Three times a day (TID) | ORAL | 0 refills | Status: AC | PRN

## 2023-07-09 MED ORDER — TESTOSTERONE 1.62 % TD GEL: 2.0000 | Freq: Every day | TRANSDERMAL | 0 refills | Status: DC

## 2023-07-31 ENCOUNTER — Encounter: Payer: Self-pay | Admitting: Family Medicine

## 2023-07-31 ENCOUNTER — Other Ambulatory Visit: Payer: Self-pay

## 2023-07-31 DIAGNOSIS — E782 Mixed hyperlipidemia: Secondary | ICD-10-CM

## 2023-07-31 DIAGNOSIS — K760 Fatty (change of) liver, not elsewhere classified: Secondary | ICD-10-CM

## 2023-07-31 MED ORDER — ROSUVASTATIN CALCIUM 10 MG PO TABS
10.0000 mg | ORAL_TABLET | Freq: Every day | ORAL | 1 refills | Status: DC
Start: 2023-07-31 — End: 2023-10-29

## 2023-08-06 ENCOUNTER — Encounter: Payer: Self-pay | Admitting: Family Medicine

## 2023-08-06 ENCOUNTER — Ambulatory Visit: Payer: No Typology Code available for payment source | Admitting: Family Medicine

## 2023-08-06 VITALS — BP 122/74 | HR 79 | Temp 98.5°F | Wt 177.8 lb

## 2023-08-06 DIAGNOSIS — E7800 Pure hypercholesterolemia, unspecified: Secondary | ICD-10-CM | POA: Diagnosis not present

## 2023-08-06 DIAGNOSIS — R1011 Right upper quadrant pain: Secondary | ICD-10-CM | POA: Diagnosis not present

## 2023-08-06 MED ORDER — PANTOPRAZOLE SODIUM 40 MG PO TBEC: 40.0000 mg | DELAYED_RELEASE_TABLET | Freq: Two times a day (BID) | ORAL | 3 refills | Status: DC

## 2023-08-06 MED ORDER — SILDENAFIL CITRATE 100 MG PO TABS: 50.0000 mg | ORAL_TABLET | Freq: Every day | ORAL | 11 refills | Status: AC | PRN

## 2023-08-06 NOTE — Progress Notes (Addendum)
Subjective:    Patient ID: Kent Hernandez, male    DOB: 10-27-1986, 37 y.o.   MRN: [id number] 2021, the patient was found to have a 3.5 cm hemangioma in his liver.  MRI at that time felt that the lesion was benign lesion.  He had intermittent right upper quadrant pain for the last 3 years.  Recently the right upper quadrant pain has become more frequent.  He states that it occurs no matter what he is doing.  It occurs 10-20 times a day.  Will last 10 to 20 seconds.  He also reports increasing GERD, epigastric discomfort, abdominal pressure and bloating.  He is taking lansoprazole 15 mg daily.  He denies any melena or hematochezia.  He denies any fever or chills.  He denies vomiting.  He does have nausea frequently.  He would like to recheck his cholesterol today.  He also reports erectile dysfunction. Past Medical History:  Diagnosis Date   Allergic rhinitis    DDD (degenerative disc disease), cervical 2015   Esophageal reflux    Fatty liver disease, nonalcoholic    GERD (gastroesophageal reflux disease)    [facility]reesia 04/12/2020   H. pylori infection    Hyperlipidemia    [facility]ia 04/12/2020   Hypertension    [facility]ia 04/12/2020   Liver hemangioma    No past surgical history on file. Current Outpatient Medications on File Prior to Visit  Medication Sig Dispense Refill   albuterol (VENTOLIN HFA) 108 (90 Base) MCG/ACT inhaler Inhale 2 puffs into the lungs every 6 (six) hours as needed for wheezing or shortness of breath. 1 each 3   ALPRAZolam (XANAX) 0.5 MG tablet Take 1 tablet (0.5 mg total) by mouth 3 (three) times daily as needed. 30 tablet 0   ascorbic acid (VITAMIN C) 500 MG tablet Take 1 tablet (500 mg total) by mouth daily.     cetirizine (ZYRTEC) 10 MG tablet Take 10 mg by mouth daily.     cyclobenzaprine (FLEXERIL) 10 MG tablet Take 1 tablet (10 mg total) by mouth 3 (three) times daily as needed for muscle spasms. 30 tablet 0   cyclobenzaprine (FLEXERIL) 10 MG tablet Take 1 tablet (10  mg total) by mouth 3 (three) times daily as needed for muscle spasms. 30 tablet 0   fluticasone (FLONASE) 50 MCG/ACT nasal spray Place 1 spray into both nostrils daily.     gabapentin (NEURONTIN) 300 MG capsule Take 1 capsule (300 mg total) by mouth 3 (three) times daily as needed (nerve pain). 90 capsule 3   lansoprazole (PREVACID) 30 MG capsule Take 30 mg by mouth daily at 12 noon.     rosuvastatin (CRESTOR) 10 MG tablet Take 1 tablet (10 mg total) by mouth daily. 90 tablet 1   Testosterone 1.62 % GEL Place 2 Pump onto the skin daily. 75 g 0   Testosterone 1.62 % GEL Place 2 Pump onto the skin daily. 75 g 0   No current facility-administered medications on file prior to visit.   No Known Allergies Social History   Socioeconomic History   Marital status: Married    Spouse name: Not on file   Number of children: Not on file   Years of education: Not on file   Highest education level: 12th grade  Occupational History   Occupation: Company secretary: [employer]  Tobacco Use   Smoking status: Former    Types: Cigarettes   Smokeless tobacco: Former    Quit date: 02/12/2011  Tobacco comments:    Light use  Substance and Sexual Activity   Alcohol use: Yes    Comment: occ   Drug use: No   Sexual activity: Yes  Other Topics Concern   Not on file  Social History Narrative   Not on file   Social Determinants of Health   Financial Resource Strain: Low Risk  (08/05/2023)   Overall Financial Resource Strain (CARDIA)    Difficulty of Paying Living Expenses: Not very hard  Food Insecurity: No Food Insecurity (08/05/2023)   Hunger Vital Sign    Worried About Running Out of Food in the Last Year: Never true    Ran Out of Food in the Last Year: Never true  Transportation Needs: No Transportation Needs (08/05/2023)   PRAPARE - Administrator, Civil Service (Medical): No    Lack of Transportation (Non-Medical): No  Physical Activity: Insufficiently Active  (08/05/2023)   Exercise Vital Sign    Days of Exercise per Week: 2 days    Minutes of Exercise per Session: 40 min  Stress: Stress Concern Present (08/05/2023)   [employer] of Occupational Health - Occupational Stress Questionnaire    Feeling of Stress : To some extent  Social Connections: Moderately Integrated (08/05/2023)   Social Connection and Isolation Panel [NHANES]    Frequency of Communication with Friends and Family: More than three times a week    Frequency of Social Gatherings with Friends and Family: More than three times a week    Attends Religious Services: More than 4 times per year    Active Member of [employer] or Organizations: No    Attends Engineer, structural: Not on file    Marital Status: Married  Catering manager Violence: Not on file      Review of Systems  Cardiovascular:  Positive for palpitations.  All other systems reviewed and are negative.      Objective:   Physical Exam Vitals reviewed.  Constitutional:      Appearance: Normal appearance. He is normal weight.  Eyes:     Conjunctiva/sclera: Conjunctivae normal.  Neck:     Thyroid: No thyroid mass, thyromegaly or thyroid tenderness.  Cardiovascular:     Rate and Rhythm: Normal rate and regular rhythm.     Pulses: Normal pulses.     Heart sounds: Normal heart sounds. No murmur heard.    No friction rub. No gallop.  Pulmonary:     Effort: Pulmonary effort is normal. No respiratory distress.     Breath sounds: Normal breath sounds. No stridor. No wheezing, rhonchi or rales.  Abdominal:     General: Abdomen is flat. Bowel sounds are normal. There is no distension.     Palpations: Abdomen is soft. There is no mass.     Tenderness: There is no abdominal tenderness. There is no guarding or rebound.     Hernia: No hernia is present.  Musculoskeletal:     Right lower leg: No edema.     Left lower leg: No edema.  Neurological:     General: No focal deficit present.     Mental Status: He  is alert and oriented to person, place, and time. Mental status is at baseline.     Cranial Nerves: No cranial nerve deficit.     Sensory: No sensory deficit.     Motor: No weakness.     Coordination: Coordination normal.     Gait: Gait normal.     Deep Tendon Reflexes: Reflexes  normal.           Assessment & Plan:  RUQ pain - Plan: US Abdomen Limited RUQ (LIVER/GB), CBC with Differential/Platelet, COMPLETE METABOLIC PANEL WITH GFR, Lipase  Pure hypercholesterolemia - Plan: Lipid panel Repeat lab work including a CBC CMP lipase.  While drawing lab work I will also check a fasting lipid panel to monitor his cholesterol.  I believe the right upper quadrant pain is most likely due to a duodenal ulcer.  Discontinue Prevacid and switch to pantoprazole 40 mg twice daily.  Reassess in 2 weeks.  Also obtain right upper quadrant ultrasound to evaluate for gallstones or any growth in the liver mass. IF Ultrasound is normal, labs are normal, and abdominal pain does not improve on PPI, would recommend GI consult for EGD.  Also try Viagra 50 to 100 mg as needed for erectile dysfunction

## 2023-08-07 ENCOUNTER — Encounter: Payer: Self-pay | Admitting: Family Medicine

## 2023-08-07 LAB — CBC WITH DIFFERENTIAL/PLATELET
Absolute Monocytes: 501 cells/uL (ref 200–950)
Basophils Absolute: 52 cells/uL (ref 0–200)
Basophils Relative: 0.8 %
Eosinophils Absolute: 163 cells/uL (ref 15–500)
Eosinophils Relative: 2.5 %
HCT: 48 % (ref 38.5–50.0)
Hemoglobin: 16.5 g/dL (ref 13.2–17.1)
Lymphs Abs: 1807 cells/uL (ref 850–3900)
MCH: 29.8 pg (ref 27.0–33.0)
MCHC: 34.4 g/dL (ref 32.0–36.0)
MCV: 86.6 fL (ref 80.0–100.0)
MPV: 10.3 fL (ref 7.5–12.5)
Monocytes Relative: 7.7 %
Neutro Abs: 3978 cells/uL (ref 1500–7800)
Neutrophils Relative %: 61.2 %
Platelets: 346 10*3/uL (ref 140–400)
RBC: 5.54 10*6/uL (ref 4.20–5.80)
RDW: 12.2 % (ref 11.0–15.0)
Total Lymphocyte: 27.8 %
WBC: 6.5 10*3/uL (ref 3.8–10.8)

## 2023-08-07 LAB — COMPLETE METABOLIC PANEL WITH GFR
AG Ratio: 1.8 (calc) (ref 1.0–2.5)
ALT: 24 U/L (ref 9–46)
AST: 21 U/L (ref 10–40)
Albumin: 4.7 g/dL (ref 3.6–5.1)
Alkaline phosphatase (APISO): 79 U/L (ref 36–130)
BUN: 15 mg/dL (ref 7–25)
CO2: 27 mmol/L (ref 20–32)
Calcium: 9.8 mg/dL (ref 8.6–10.3)
Chloride: 102 mmol/L (ref 98–110)
Creat: 1.09 mg/dL (ref 0.60–1.26)
Globulin: 2.6 g/dL (calc) (ref 1.9–3.7)
Glucose, Bld: 94 mg/dL (ref 65–99)
Potassium: 4.1 mmol/L (ref 3.5–5.3)
Sodium: 139 mmol/L (ref 135–146)
Total Bilirubin: 1.1 mg/dL (ref 0.2–1.2)
Total Protein: 7.3 g/dL (ref 6.1–8.1)
eGFR: 89 mL/min/{1.73_m2} (ref >=60)

## 2023-08-07 LAB — LIPID PANEL
Cholesterol: 167 mg/dL (ref <200)
HDL: 44 mg/dL (ref >=40)
LDL Cholesterol (Calc): 98 mg/dL (calc)
Non-HDL Cholesterol (Calc): 123 mg/dL (calc) (ref <130)
Total CHOL/HDL Ratio: 3.8 (calc) (ref <5.0)
Triglycerides: 156 mg/dL — ABNORMAL HIGH (ref <150)

## 2023-08-07 LAB — LIPASE: Lipase: 30 U/L (ref 7–60)

## 2023-08-09 ENCOUNTER — Ambulatory Visit
Admission: RE | Admit: 2023-08-09 | Discharge: 2023-08-09 | Disposition: A | Discharge status: Home / Self Care | Payer: No Typology Code available for payment source | Source: Ambulatory Visit | Attending: Family Medicine | Admitting: Family Medicine

## 2023-08-09 DIAGNOSIS — R1011 Right upper quadrant pain: Secondary | ICD-10-CM

## 2023-08-10 ENCOUNTER — Encounter: Payer: Self-pay | Admitting: Family Medicine

## 2023-08-23 ENCOUNTER — Other Ambulatory Visit: Payer: Self-pay | Admitting: Family Medicine

## 2023-08-27 ENCOUNTER — Encounter: Payer: Self-pay | Admitting: Family Medicine

## 2023-08-27 ENCOUNTER — Other Ambulatory Visit: Payer: Self-pay | Admitting: Family Medicine

## 2023-08-27 MED ORDER — TESTOSTERONE 1.62 % TD GEL: 2.0000 | Freq: Every day | TRANSDERMAL | 3 refills | Status: DC

## 2023-09-03 ENCOUNTER — Encounter: Payer: Self-pay | Admitting: Family Medicine

## 2023-09-04 MED ORDER — PANTOPRAZOLE SODIUM 40 MG PO TBEC: 40.0000 mg | DELAYED_RELEASE_TABLET | Freq: Two times a day (BID) | ORAL | 3 refills | Status: DC

## 2023-09-13 ENCOUNTER — Ambulatory Visit: Payer: No Typology Code available for payment source | Admitting: Family Medicine

## 2023-09-18 ENCOUNTER — Ambulatory Visit: Payer: No Typology Code available for payment source | Admitting: Family Medicine

## 2023-09-18 ENCOUNTER — Encounter: Payer: Self-pay | Admitting: Family Medicine

## 2023-09-18 ENCOUNTER — Other Ambulatory Visit: Payer: Self-pay | Admitting: Family Medicine

## 2023-09-18 VITALS — BP 122/84 | HR 79 | Temp 98.3°F | Ht 69.0 in | Wt 179.1 lb

## 2023-09-18 DIAGNOSIS — G4710 Hypersomnia, unspecified: Secondary | ICD-10-CM

## 2023-09-18 DIAGNOSIS — M5412 Radiculopathy, cervical region: Secondary | ICD-10-CM

## 2023-09-18 MED ORDER — CELECOXIB 200 MG PO CAPS: 200.0000 mg | ORAL_CAPSULE | Freq: Two times a day (BID) | ORAL | 1 refills | Status: DC

## 2023-09-18 MED ORDER — PANTOPRAZOLE SODIUM 40 MG PO TBEC: 40.0000 mg | DELAYED_RELEASE_TABLET | Freq: Every day | ORAL | 3 refills | Status: DC

## 2023-09-18 NOTE — Progress Notes (Signed)
Subjective:    Patient ID: Kent Hernandez, male    DOB: Aug 12, 1986, 37 y.o.   MRN: [id number]  Neck Pain   12/25/22 Patient states that for the last month or so he has been having bilateral neck pain located at the base of the occiput that radiates up on both sides into his temples and along his parietal lobe.  Is a dull constant ache.  He has been taking Flexeril with minimal relief.  He also has some numbness and tingling and burning in his left arm.  He had an MRI of the neck.  2015.  At that time he was found to have mild degenerative disc disease at C5-C6.  There was no significant nerve impingement at that time.  He denies any injuries.  He denies any weakness in his left hand or weakness in his right hand.  He denies any trouble swallowing.  At that time, my plan was: I believe the patient is likely having a neuropathic headache brought on by degenerative disc disease and nerve impingement in his neck.  We discussed starting a prednisone taper pack but this upsets his stomach we will try Celebrex 200 mg every 12 hours for the next 2 weeks and see if symptoms improve.  Consider imaging of the neck if persistent  09/18/23 Patient continues to complain of stiffness and pain in his neck.  I do believe the patient deals with generalized anxiety disorder so I feel a lot of his stiffness in his neck are likely muscle spasms and muscle tightness.  However now he is reporting numbness and tingling radiating down his left arm.  He states that this been going on for more than 6 weeks.  He denies any weakness in his left arm.  Full strength 5/5 equal symmetric in both him.  Negative Tinel's sign and negative Phalen sign.  The numbness and tingling starts at his shoulder and radiates to his fingertips.  He also reports weakness in his left arm at times.  He also reports feeling extremely tired.  He states that he has good days and bad days but he always feels tired and worn out.  At times he will wake up from  a deep sleep with his heart racing.  He feels like he wakes up suffocating.  His wife is heard him stop breathing.  He would like to be evaluated for sleep apnea Past Medical History:  Diagnosis Date   Allergic rhinitis    DDD (degenerative disc disease), cervical 2015   Esophageal reflux    Fatty liver disease, nonalcoholic    GERD (gastroesophageal reflux disease)    [employer]reesia 04/12/2020   H. pylori infection    Hyperlipidemia    [employer]reesia 04/12/2020   Hypertension    [employer]reesia 04/12/2020   Liver hemangioma    No past surgical history on file. Current Outpatient Medications on File Prior to Visit  Medication Sig Dispense Refill   albuterol (VENTOLIN HFA) 108 (90 Base) MCG/ACT inhaler Inhale 2 puffs into the lungs every 6 (six) hours as needed for wheezing or shortness of breath. 1 each 3   ALPRAZolam (XANAX) 0.5 MG tablet Take 1 tablet (0.5 mg total) by mouth 3 (three) times daily as needed. 30 tablet 0   ascorbic acid (VITAMIN C) 500 MG tablet Take 1 tablet (500 mg total) by mouth daily.     cetirizine (ZYRTEC) 10 MG tablet Take 10 mg by mouth daily.     cyclobenzaprine (FLEXERIL) 10 MG tablet Take  1 tablet (10 mg total) by mouth 3 (three) times daily as needed for muscle spasms. 30 tablet 0   cyclobenzaprine (FLEXERIL) 10 MG tablet Take 1 tablet (10 mg total) by mouth 3 (three) times daily as needed for muscle spasms. 30 tablet 0   fluticasone (FLONASE) 50 MCG/ACT nasal spray Place 1 spray into both nostrils daily.     gabapentin (NEURONTIN) 300 MG capsule Take 1 capsule (300 mg total) by mouth 3 (three) times daily as needed (nerve pain). 90 capsule 3   lansoprazole (PREVACID) 30 MG capsule Take 30 mg by mouth daily at 12 noon.     pantoprazole (PROTONIX) 40 MG tablet Take 1 tablet (40 mg total) by mouth 2 (two) times daily. 60 tablet 3   rosuvastatin (CRESTOR) 10 MG tablet Take 1 tablet (10 mg total) by mouth daily. 90 tablet 1   sildenafil (VIAGRA) 100 MG tablet Take 0.5-1 tablets  (50-100 mg total) by mouth daily as needed for erectile dysfunction. 5 tablet 11   Testosterone 1.62 % GEL Place 2 Pump onto the skin daily. 75 g 0   No current facility-administered medications on file prior to visit.   No Known Allergies Social History   Socioeconomic History   Marital status: Married    Spouse name: Not on file   Number of children: Not on file   Years of education: Not on file   Highest education level: 12th grade  Occupational History   Occupation: Company secretary: [employer]  Tobacco Use   Smoking status: Former    Types: Cigarettes   Smokeless tobacco: Former    Quit date: 02/12/2011   Tobacco comments:    Light use  Substance and Sexual Activity   Alcohol use: Yes    Comment: occ   Drug use: No   Sexual activity: Yes  Other Topics Concern   Not on file  Social History Narrative   Not on file   Social Determinants of Health   Financial Resource Strain: Low Risk  (09/16/2023)   Overall Financial Resource Strain (CARDIA)    Difficulty of Paying Living Expenses: Not hard at all  Food Insecurity: No Food Insecurity (09/16/2023)   Hunger Vital Sign    Worried About Running Out of Food in the Last Year: Never true    Ran Out of Food in the Last Year: Never true  Transportation Needs: No Transportation Needs (09/16/2023)   PRAPARE - Administrator, Civil Service (Medical): No    Lack of Transportation (Non-Medical): No  Physical Activity: Insufficiently Active (09/16/2023)   Exercise Vital Sign    Days of Exercise per Week: 4 days    Minutes of Exercise per Session: 30 min  Stress: No Stress Concern Present (09/16/2023)   [employer] of Occupational Health - Occupational Stress Questionnaire    Feeling of Stress : Only a little  Recent Concern: Stress - Stress Concern Present (08/05/2023)   [employer] of Occupational Health - Occupational Stress Questionnaire    Feeling of Stress : To some extent  Social  Connections: Moderately Integrated (09/16/2023)   Social Connection and Isolation Panel [NHANES]    Frequency of Communication with Friends and Family: More than three times a week    Frequency of Social Gatherings with Friends and Family: Three times a week    Attends Religious Services: More than 4 times per year    Active Member of Clubs or Organizations: No    Attends  Banker Meetings: Not on file    Marital Status: Married  Intimate Partner Violence: Not on file      Review of Systems  Musculoskeletal:  Positive for neck pain.  All other systems reviewed and are negative.      Objective:   Physical Exam Vitals reviewed.  Constitutional:      Appearance: Normal appearance. He is normal weight.  HENT:     Head:   Cardiovascular:     Rate and Rhythm: Normal rate and regular rhythm.     Heart sounds: Normal heart sounds.  Pulmonary:     Effort: Pulmonary effort is normal.     Breath sounds: Normal breath sounds.  Musculoskeletal:     Cervical back: Neck supple. No rigidity. Pain with movement present. Decreased range of motion.  Lymphadenopathy:     Cervical: No cervical adenopathy.  Neurological:     General: No focal deficit present.     Mental Status: He is alert and oriented to person, place, and time. Mental status is at baseline.     Cranial Nerves: No cranial nerve deficit.     Sensory: No sensory deficit.     Motor: No weakness.     Coordination: Coordination normal.     Gait: Gait normal.           Assessment & Plan:  Cervical radiculopathy - Plan: DG Cervical Spine Complete  Hypersomnolence - Plan: Ambulatory referral to Sleep Studies Patient is now having left-sided cervical radiculopathy.  Begin by obtaining x-rays of the cervical spine.  Try the patient on Celebrex 200 mg twice a day as needed.  Patient states that the Flexeril and gabapentin makes him too sleepy.  If the Celebrex is not helping, the neck step may be tried physical  therapy.  If worsening at that point I would recommend an MRI.  Patient is having witnessed apneic episodes.  Therefore I recommended a sleep study to evaluate for obstructive sleep apnea as well.

## 2023-09-19 NOTE — Telephone Encounter (Signed)
Requested medication (s) are due for refill today:   Yes  Requested medication (s) are on the active medication list:   Yes  Future visit scheduled:   No  Seen yesterday 11/5   Last ordered: 09/18/2023 #90, 3 refills  See pharmacy note:  Insurance only will cover 90 tablets in a 365 day period, consider a PA or alternative.   Requested Prescriptions  Pending Prescriptions Disp Refills   pantoprazole (PROTONIX) 40 MG tablet [Pharmacy Med Name: PANTOPRAZOLE SOD DR 40 MG TAB] 90 tablet 3    Sig: TAKE 1 TABLET BY MOUTH EVERY DAY     Gastroenterology: Proton Pump Inhibitors Failed - 09/18/2023 11:48 AM      Failed - Valid encounter within last 12 months    Recent Outpatient Visits           1 year ago Hypogonadism in male   [facility] Pickard, Priscille Heidelberg, MD   1 year ago Other chest pain   [facility] Pickard, Priscille Heidelberg, MD   1 year ago Fatigue, unspecified type   [facility] Donita Brooks, MD   1 year ago Upper respiratory tract infection, unspecified type   [facility] Valentino Nose, NP   2 years ago General medical exam   [facility] Pickard, Priscille Heidelberg, MD

## 2023-09-26 ENCOUNTER — Other Ambulatory Visit: Payer: Self-pay

## 2023-09-26 ENCOUNTER — Encounter: Payer: Self-pay | Admitting: Family Medicine

## 2023-09-26 DIAGNOSIS — E291 Testicular hypofunction: Secondary | ICD-10-CM

## 2023-09-26 MED ORDER — TESTOSTERONE 1.62 % TD GEL
2.0000 | Freq: Every day | TRANSDERMAL | 0 refills | Status: DC
Start: 2023-09-26 — End: 2024-01-22

## 2023-10-09 ENCOUNTER — Other Ambulatory Visit: Payer: Self-pay

## 2023-10-09 DIAGNOSIS — K219 Gastro-esophageal reflux disease without esophagitis: Secondary | ICD-10-CM

## 2023-10-09 MED ORDER — ESOMEPRAZOLE MAGNESIUM 40 MG PO CPDR
40.0000 mg | DELAYED_RELEASE_CAPSULE | Freq: Every day | ORAL | 3 refills | Status: AC
Start: 2023-10-09 — End: ?

## 2023-10-09 NOTE — Progress Notes (Signed)
nex

## 2023-10-24 ENCOUNTER — Encounter: Payer: Self-pay | Admitting: Family Medicine

## 2023-10-25 ENCOUNTER — Ambulatory Visit: Payer: No Typology Code available for payment source | Admitting: Neurology

## 2023-10-25 ENCOUNTER — Encounter: Payer: Self-pay | Admitting: Neurology

## 2023-10-25 VITALS — BP 130/87 | HR 79 | Ht 69.0 in | Wt 183.0 lb

## 2023-10-25 DIAGNOSIS — R002 Palpitations: Secondary | ICD-10-CM

## 2023-10-25 DIAGNOSIS — R0683 Snoring: Secondary | ICD-10-CM

## 2023-10-25 DIAGNOSIS — G478 Other sleep disorders: Secondary | ICD-10-CM | POA: Insufficient documentation

## 2023-10-25 DIAGNOSIS — R0689 Other abnormalities of breathing: Secondary | ICD-10-CM | POA: Diagnosis not present

## 2023-10-25 DIAGNOSIS — U099 Post covid-19 condition, unspecified: Secondary | ICD-10-CM

## 2023-10-25 DIAGNOSIS — M2629 Other anomalies of dental arch relationship: Secondary | ICD-10-CM

## 2023-10-25 NOTE — Patient Instructions (Signed)
Sleep Apnea  Sleep apnea is a condition that affects your breathing while you are sleeping. Your tongue or soft tissue in your throat may block the flow of air while you sleep. You may have shallow breathing or stop breathing for short periods of time. People with sleep apnea may snore loudly. There are three kinds of sleep apnea: Obstructive sleep apnea. This kind is caused by a blocked or collapsed airway. This is the most common. Central sleep apnea. This kind happens when the part of the brain that controls breathing does not send the correct signals to the muscles that control breathing. Mixed sleep apnea. This is a combination of obstructive and central sleep apnea. What are the causes? The most common cause of sleep apnea is a collapsed or blocked airway. What increases the risk? Being very overweight. Having family members with sleep apnea. Having a tongue or tonsils that are larger than normal. Having a small airway or jaw problems. Being older. What are the signs or symptoms? Loud snoring. Restless sleep. Trouble staying asleep. Being sleepy or tired during the day. Waking up gasping or choking. Having a headache in the morning. Mood swings. Having a hard time remembering things and concentrating. How is this diagnosed? A medical history. A physical exam. A sleep study. This is also called a polysomnography test. This test is done at a sleep lab or in your home while you are sleeping. How is this treated? Treatment may include: Sleeping on your side. Losing weight if you're overweight. Wearing an oral appliance. This is a mouthpiece that moves your lower jaw forward. Using a positive airway pressure (PAP) device to keep your airways open while you sleep, such as: A continuous positive airway pressure (CPAP) device. This device gives forced air through a mask when you breathe out. This keeps your airways open. A bilevel positive airway pressure (BIPAP) device. This device  gives forced air through a mask when you breathe in and when you breathe out to keep your airways open. Having surgery if other treatments do not work. If your sleep apnea is not treated, you may be at risk for: Heart failure. Heart attack. Stroke. Type 2 diabetes or a problem with your blood sugar called insulin resistance. Follow these instructions at home: Medicines Take your medicines only as told by your health care provider. Avoid alcohol, medicines to help you relax, and certain pain medicines. These may make sleep apnea worse. General instructions Do not smoke, vape, or use products with nicotine or tobacco in them. If you need help quitting, talk with your provider. If you were given a PAP device to open your airway while you sleep, use it as told by your provider. If you're having surgery, make sure to tell your provider you have sleep apnea. You may need to bring your PAP device with you. Contact a health care provider if: The PAP device that you were given to use during sleep bothers you or does not seem to be working. You do not feel better or you feel worse. Get help right away if: You have trouble breathing. You have chest pain. You have trouble talking. One side of your body feels weak. A part of your face is hanging down. These symptoms may be an emergency. Call 911 right away. Do not wait to see if the symptoms will go away. Do not drive yourself to the hospital. This information is not intended to replace advice given to you by your health care provider. Make sure  you discuss any questions you have with your health care provider. Document Revised: 01/04/2023 Document Reviewed: 01/04/2023 Elsevier Patient Education  2024 [employer] Screening for Sleep Apnea  Sleep apnea is a condition in which breathing pauses or becomes shallow during sleep. Sleep apnea screening is a test to determine if you are at risk for sleep apnea. The test includes a series of questions.  It will only takes a few minutes. Your health care provider may ask you to have this test in preparation for surgery or as part of a physical exam. What are the symptoms of sleep apnea? Common symptoms of sleep apnea include: Snoring. Waking up often at night. Daytime sleepiness. Pauses in breathing. Choking or gasping during sleep. Irritability. Forgetfulness. Trouble thinking clearly. Depression. Personality changes. Most people with sleep apnea do not know that they have it. What are the advantages of sleep apnea screening? Getting screened for sleep apnea can help: Ensure your safety. It is important for your health care providers to know whether or not you have sleep apnea, especially if you are having surgery or have other long-term (chronic) health conditions. Improve your health and allow you to get a better night's rest. Restful sleep can help you: Have more energy. Lose weight. Improve high blood pressure. Improve diabetes management. Prevent stroke. Prevent car accidents. What happens during the screening? Screening usually includes being asked a list of questions about your sleep quality. Some questions you may be asked include: Do you snore? Is your sleep restless? Do you have daytime sleepiness? Has a partner or spouse told you that you stop breathing during sleep? Have you had trouble concentrating or memory loss? What is your age? What is your neck circumference? To measure your neck, keep your back straight and gently wrap the tape measure around your neck. Put the tape measure at the middle of your neck, between your chin and collarbone. What is your sex assigned at birth? Do you have or are you being treated for high blood pressure? If your screening test is positive, you are at risk for the condition. Further testing may be needed to confirm a diagnosis of sleep apnea. Where to find more information You can find screening tools online or at your health care  clinic. For more information about sleep apnea screening and healthy sleep, visit these websites: Centers for Disease Control and Prevention: [employer] American Sleep Apnea Association: www.sleepapnea.org Contact a health care provider if: You think that you may have sleep apnea. Summary Sleep apnea screening can help determine if you are at risk for sleep apnea. It is important for your health care providers to know whether or not you have sleep apnea, especially if you are having surgery or have other chronic health conditions. You may be asked to take a screening test for sleep apnea in preparation for surgery or as part of a physical exam. This information is not intended to replace advice given to you by your health care provider. Make sure you discuss any questions you have with your health care provider. Document Revised: 10/08/2020 Document Reviewed: 10/08/2020 Elsevier Patient Education  2024 [employer].

## 2023-10-25 NOTE — Progress Notes (Signed)
SLEEP MEDICINE CLINIC    Provider:  Melvyn Novas, MD  Primary Care Physician:  Donita Brooks, MD [address] [facility] [address] [place] [address]     Referring Provider: Donita Brooks, Md [address] [place],  [place] [address]          Chief Complaint according to patient   Patient presents with:     New Patient (Initial Visit)           HISTORY OF PRESENT ILLNESS:  Kent Hernandez is a 37 y.o. male patient who is seen upon referral on 10/25/2023 from PCP Dr Tanya Nones for a Sleep Medicine consultation.  Chief concern according to patient :  " I woke up choking while sleeping on my stomach, my wife stated I snore more, and my sleep just isn't good, not refreshing , not restoring. I wake up a lot , when at home and when in hotel rooms." I work for [employer] and visit all stores.     I have the pleasure of seeing Kent Hernandez 10/25/23 a right-handed married male with a possible sleep disorder. He is a father of one 19-year old son.      Sleep relevant medical history: I dropped 30 pounds with severe COVID and was hospitalized in 2021, on ventolin since. Heart palpitations, Nocturia 1-2, GERD, no  Sleep walking hx , No ENT surgeries/ no Tonsillectomy,  cervical spine DDD, deviated septum without repair.    Family medical /sleep history: father on CPAP with OSA, insomnia, sleep walkers.    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  Tobacco use: quit 1 year ago, chewing.   ETOH use ; less than 2/ week.  Caffeine intake in form of Coffee( half caff/ 1-2 a day ) Soda( decaff) Tea ( /) no energy drinks Exercise in form of walking. .   Hobbies : garden , basketball, golf.   Sleep habits are as follows: The patient's dinner time is between 5-6 PM.  The patient goes to bed at 9.30 PM and  continues to sleep for fragmented 6 -8 hours, wakes for 1-2 bathroom breaks, the first time at 1 AM.   The preferred sleep position is laterally, snoring is loudest in supine , with the support of 1 memory foam- pillow. Dreams are reportedly frequent.   The patient wakes up with an alarm. 6.30  AM is the usual rise time. He reports not feeling refreshed or restored in AM, with symptoms such as dry mouth, rarely having morning headaches, and residual fatigue.  Naps are taken on sundays frequently, lasting from 1-2 hours, waking up groggy.   Review of Systems: Out of a complete 14 system review, the patient complains of only the following symptoms, and all other reviewed systems are negative.:  Fatigue, sleepiness , snoring, fragmented sleep, Insomnia, RLS, Nocturia .   How likely are you to doze in the following situations: 0 = not likely, 1 = slight chance, 2 = moderate chance, 3 = high chance   Sitting and Reading? Watching Television? Sitting inactive in a public place (theater or meeting)? As a passenger in a car for an hour without  a break? Lying down in the afternoon when circumstances permit? Sitting and talking to someone? Sitting quietly after lunch without alcohol? In a car, while stopped for a few minutes in traffic?   Total = 13/ 24 points   FSS endorsed at 47/ 63 points.    Was severly fatigued after COVID pneumonia,   Social History   Socioeconomic History   Marital status: Married    Spouse name: Not on file   Number of children: Not on file   Years of education: Not on file   Highest education level: 12th grade  Occupational History   Occupation: Company secretary: [employer]  Tobacco Use   Smoking status: Former    Types: Cigarettes   Smokeless tobacco: Former    Quit date: 02/12/2011   Tobacco comments:    Light use  Substance and Sexual Activity   Alcohol use: Yes    Alcohol/week: 3.0 standard drinks of alcohol    Types: 2 Cans of beer, 1  Shots of liquor per week    Comment: occ   Drug use: No   Sexual activity: Yes  Other Topics Concern   Not on file  Social History Narrative   Pt lives with wife    [employer]    Social Drivers of Health   Financial Resource Strain: Low Risk  (09/16/2023)   Overall Financial Resource Strain (CARDIA)    Difficulty of Paying Living Expenses: Not hard at all  Food Insecurity: No Food Insecurity (09/16/2023)   Hunger Vital Sign    Worried About Running Out of Food in the Last Year: Never true    Ran Out of Food in the Last Year: Never true  Transportation Needs: No Transportation Needs (09/16/2023)   PRAPARE - Administrator, Civil Service (Medical): No    Lack of Transportation (Non-Medical): No  Physical Activity: Insufficiently Active (09/16/2023)   Exercise Vital Sign    Days of Exercise per Week: 4 days    Minutes of Exercise per Session: 30 min  Stress: No Stress Concern Present (09/16/2023)   [employer] of Occupational Health - Occupational Stress Questionnaire    Feeling of Stress : Only a little  Recent Concern: Stress - Stress Concern Present (08/05/2023)   [employer] of Occupational Health - Occupational Stress Questionnaire    Feeling of Stress : To some extent  Social Connections: Moderately Integrated (09/16/2023)   Social Connection and Isolation Panel [NHANES]    Frequency of Communication with Friends and Family: More than three times a week    Frequency of Social Gatherings with Friends and Family: Three times a week    Attends Religious Services: More than 4 times per year    Active Member of Clubs or Organizations: No    Attends Engineer, structural: Not on file    Marital Status: Married    Family History  Problem Relation Age of Onset   Sleep apnea Mother    Hypertension Father    Kidney disease Father    Diabetes Maternal Uncle    Cancer Maternal Grandmother    Heart disease Paternal Grandmother    Diabetes Paternal  Grandfather    Heart disease Paternal Grandfather     Past Medical History:  Diagnosis Date   Allergic rhinitis    DDD (degenerative disc disease), cervical 2015   Esophageal reflux    Fatty liver disease, nonalcoholic    GERD (gastroesophageal reflux disease)  Phreesia 04/12/2020   H. pylori infection    Hyperlipidemia    Phreesia 04/12/2020   Hypertension    Phreesia 04/12/2020   Liver hemangioma     History reviewed. No pertinent surgical history.   Current Outpatient Medications on File Prior to Visit  Medication Sig Dispense Refill   albuterol (VENTOLIN HFA) 108 (90 Base) MCG/ACT inhaler Inhale 2 puffs into the lungs every 6 (six) hours as needed for wheezing or shortness of breath. 1 each 3   ALPRAZolam (XANAX) 0.5 MG tablet Take 1 tablet (0.5 mg total) by mouth 3 (three) times daily as needed. 30 tablet 0   ascorbic acid (VITAMIN C) 500 MG tablet Take 1 tablet (500 mg total) by mouth daily.     celecoxib (CELEBREX) 200 MG capsule Take 1 capsule (200 mg total) by mouth 2 (two) times daily. 60 capsule 1   cetirizine (ZYRTEC) 10 MG tablet Take 10 mg by mouth daily.     cyclobenzaprine (FLEXERIL) 10 MG tablet Take 1 tablet (10 mg total) by mouth 3 (three) times daily as needed for muscle spasms. 30 tablet 0   cyclobenzaprine (FLEXERIL) 10 MG tablet Take 1 tablet (10 mg total) by mouth 3 (three) times daily as needed for muscle spasms. 30 tablet 0   esomeprazole (NEXIUM) 40 MG capsule Take 1 capsule (40 mg total) by mouth daily. 30 capsule 3   fluticasone (FLONASE) 50 MCG/ACT nasal spray Place 1 spray into both nostrils daily.     gabapentin (NEURONTIN) 300 MG capsule Take 1 capsule (300 mg total) by mouth 3 (three) times daily as needed (nerve pain). 90 capsule 3   rosuvastatin (CRESTOR) 10 MG tablet Take 1 tablet (10 mg total) by mouth daily. 90 tablet 1   sildenafil (VIAGRA) 100 MG tablet Take 0.5-1 tablets (50-100 mg total) by mouth daily as needed for erectile dysfunction.  5 tablet 11   Testosterone 1.62 % GEL Place 2 Pump onto the skin daily. 75 g 0   No current facility-administered medications on file prior to visit.    No Known Allergies   DIAGNOSTIC DATA (LABS, IMAGING, TESTING) - I reviewed patient records, labs, notes, testing and imaging myself where available.  Lab Results  Component Value Date   WBC 6.5 08/06/2023   HGB 16.5 08/06/2023   HCT 48.0 08/06/2023   MCV 86.6 08/06/2023   PLT 346 08/06/2023      Component Value Date/Time   NA 139 08/06/2023 1035   K 4.1 08/06/2023 1035   CL 102 08/06/2023 1035   CO2 27 08/06/2023 1035   GLUCOSE 94 08/06/2023 1035   BUN 15 08/06/2023 1035   CREATININE 1.09 08/06/2023 1035   CALCIUM 9.8 08/06/2023 1035   PROT 7.3 08/06/2023 1035   ALBUMIN 3.5 07/17/2020 0242   AST 21 08/06/2023 1035   ALT 24 08/06/2023 1035   ALKPHOS 58 07/17/2020 0242   BILITOT 1.1 08/06/2023 1035   GFRNONAA 87 02/22/2021 0808   GFRAA 101 02/22/2021 0808   Lab Results  Component Value Date   CHOL 167 08/06/2023   HDL 44 08/06/2023   LDLCALC 98 08/06/2023   TRIG 156 (H) 08/06/2023   CHOLHDL 3.8 08/06/2023   No results found for: "HGBA1C" Lab Results  Component Value Date   VITAMINB12 384 12/22/2021   Lab Results  Component Value Date   TSH 3.30 01/19/2023    PHYSICAL EXAM:  Today's Vitals   10/25/23 0907  BP: 130/87  Pulse: 79  Weight: 183  lb (83 kg)  Height: 5\' 9"  (1.753 m)   Body mass index is 27.02 kg/m.   Wt Readings from Last 3 Encounters:  10/25/23 183 lb (83 kg)  09/18/23 179 lb 2 oz (81.3 kg)  08/06/23 177 lb 12.8 oz (80.6 kg)     Ht Readings from Last 3 Encounters:  10/25/23 5\' 9"  (1.753 m)  09/18/23 5\' 9"  (1.753 m)  03/09/23 5\' 9"  (1.753 m)      General: The patient is awake, alert and appears not in acute distress. The patient is well groomed. Head: Normocephalic, atraumatic. Neck is supple. Mallampati 3,  neck circumference:16 inches . Nasal airflow not fully patent.   Retrognathia is  seen.  Dental status: crowded, overbite. Facial hair.  Cardiovascular:  Regular rate and cardiac rhythm by pulse,  without distended neck veins. Respiratory: Lungs are clear to auscultation.  Skin:  Without evidence of ankle edema, or rash. Trunk: The patient's posture is erect.   NEUROLOGIC EXAM: The patient is awake and alert, oriented to place and time.   Memory subjective described as intact.  Attention span & concentration ability appears normal.  Speech is fluent,  without  dysarthria, dysphonia or aphasia.  Mood and affect are appropriate.   Cranial nerves: no loss of smell or taste reported  Pupils are equal and briskly reactive to light. Funduscopic exam deferred.  Extraocular movements in vertical and horizontal planes were intact and without nystagmus. No Diplopia. Visual fields by finger perimetry are intact. Hearing was intact to soft voice and finger rubbing.    Facial sensation intact to fine touch.  Facial motor strength is symmetric and tongue and uvula move midline.  Neck ROM : rotation, tilt and flexion extension were normal for age and shoulder shrug was symmetrical.    Motor exam:  Symmetric bulk, tone and ROM.   Normal tone without cog- wheeling, symmetric grip strength .   Sensory:  Fine touch, pinprick and vibration were tested  and  normal.  Proprioception tested in the upper extremities was normal.   Coordination: Rapid alternating movements in the fingers/hands were of normal speed.  The Finger-to-nose maneuver was intact without evidence of ataxia, dysmetria or tremor.   Gait and station: Patient could rise unassisted from a seated position, walked without assistive device.  Stance is of normal width/ base and the patient turned with 3 steps (observation by RN) .  Toe and heel walk were deferred.  Deep tendon reflexes: in the upper and lower extremities are symmetric and intact.  Babinski response was deferred.     ASSESSMENT AND  PLAN 37 y.o. year old male  here with:    1) No restorative sleep, sleep choking, daytime fatigue and sleepiness , and high risk for apnea due to oral and dental anatomy, nasal congestion.    There is also a possibility of hypoxia due to long term COVID changes to lung and heart, palpitations. He had pneumonia and hypoxia.   2) Plan to screen for OSA/ hypoxia by HST.   RV after HST and  treatment initiation if indicated.     I plan to follow up either personally or through our NP within 3-5 months.   I would like to thank Donita Brooks, MD and Donita Brooks, Md [address] [place],  [place] [address] for allowing me to meet with and to take care of this pleasant patient.    After spending a total time of  45  minutes  face to face and additional time for physical and neurologic examination, review of laboratory studies,  personal review of imaging studies, reports and results of other testing and review of referral information / records as far as provided in visit,   Electronically signed by: Melvyn Novas, MD 10/25/2023 9:14 AM  [facility] Board certified by The [employer] of Sleep Medicine and Diplomate of the [employer] of Sleep Medicine. Board certified In Neurology through the ABPN, Fellow of the [employer] of Neurology.

## 2023-10-26 ENCOUNTER — Other Ambulatory Visit: Payer: Self-pay | Admitting: Family Medicine

## 2023-10-26 DIAGNOSIS — K760 Fatty (change of) liver, not elsewhere classified: Secondary | ICD-10-CM

## 2023-10-26 DIAGNOSIS — E782 Mixed hyperlipidemia: Secondary | ICD-10-CM

## 2023-10-29 ENCOUNTER — Ambulatory Visit: Payer: No Typology Code available for payment source | Attending: Family Medicine

## 2023-10-29 ENCOUNTER — Ambulatory Visit: Payer: No Typology Code available for payment source | Admitting: Family Medicine

## 2023-10-29 ENCOUNTER — Encounter: Payer: Self-pay | Admitting: Family Medicine

## 2023-10-29 VITALS — BP 140/86 | HR 80 | Ht 69.0 in | Wt 183.4 lb

## 2023-10-29 DIAGNOSIS — E291 Testicular hypofunction: Secondary | ICD-10-CM

## 2023-10-29 DIAGNOSIS — R002 Palpitations: Secondary | ICD-10-CM

## 2023-10-29 NOTE — Progress Notes (Signed)
Subjective:    Patient ID: Kent Hernandez, male    DOB: 22-Aug-1986, 37 y.o.   MRN: [id number]  Patient reports frequent palpitations that will literally take his breath away.  They last a split-second and then stop.  They make him feel like he needs to take a deep breath or cough.  He feels lightheaded with this.  They have been occurring more frequently.  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 Past Medical History:  Diagnosis Date   Allergic rhinitis    DDD (degenerative disc disease), cervical 2015   Esophageal reflux    Fatty liver disease, nonalcoholic    GERD (gastroesophageal reflux disease)    Phreesia 04/12/2020   H. pylori infection    Hyperlipidemia    Phreesia 04/12/2020   Hypertension    Phreesia 04/12/2020   Liver hemangioma    No past surgical history on file. Current Outpatient Medications on File Prior to Visit  Medication Sig Dispense Refill   albuterol (VENTOLIN HFA) 108 (90 Base) MCG/ACT inhaler Inhale 2 puffs into the lungs every 6 (six) hours as needed for wheezing or shortness of breath. 1 each 3   ALPRAZolam (XANAX) 0.5 MG tablet Take 1 tablet (0.5 mg total) by mouth 3 (three) times daily as needed. 30 tablet 0   ascorbic acid (VITAMIN C) 500 MG tablet Take 1 tablet (500 mg total) by mouth daily.     celecoxib (CELEBREX) 200 MG capsule Take 1 capsule (200 mg total) by mouth 2 (two) times daily. 60 capsule 1   cetirizine (ZYRTEC) 10 MG tablet Take 10 mg by mouth daily.     cyclobenzaprine (FLEXERIL) 10 MG tablet Take 1 tablet (10 mg total) by mouth 3 (three) times daily as needed for muscle spasms. 30 tablet 0   cyclobenzaprine (FLEXERIL) 10 MG tablet Take 1 tablet (10 mg total) by mouth 3 (three) times daily as needed for muscle spasms. 30 tablet 0   esomeprazole (NEXIUM) 40 MG capsule Take 1 capsule (40 mg total) by mouth daily. 30 capsule 3   fluticasone (FLONASE) 50 MCG/ACT nasal spray Place 1 spray into both nostrils daily.     gabapentin (NEURONTIN)  300 MG capsule Take 1 capsule (300 mg total) by mouth 3 (three) times daily as needed (nerve pain). 90 capsule 3   rosuvastatin (CRESTOR) 10 MG tablet Take 1 tablet (10 mg total) by mouth daily. 90 tablet 1   rosuvastatin (CRESTOR) 20 MG tablet TAKE 1 TABLET BY MOUTH EVERY DAY 90 tablet 1   sildenafil (VIAGRA) 100 MG tablet Take 0.5-1 tablets (50-100 mg total) by mouth daily as needed for erectile dysfunction. 5 tablet 11   Testosterone 1.62 % GEL Place 2 Pump onto the skin daily. 75 g 0   No current facility-administered medications on file prior to visit.   No Known Allergies Social History   Socioeconomic History   Marital status: Married    Spouse name: Not on file   Number of children: Not on file   Years of education: Not on file   Highest education level: 12th grade  Occupational History   Occupation: Company secretary: [employer]  Tobacco Use   Smoking status: Former    Types: Cigarettes   Smokeless tobacco: Former    Quit date: 02/12/2011   Tobacco comments:    Light use  Substance and Sexual Activity   Alcohol use: Yes    Alcohol/week: 3.0 standard drinks of alcohol  Types: 2 Cans of beer, 1 Shots of liquor per week    Comment: occ   Drug use: No   Sexual activity: Yes  Other Topics Concern   Not on file  Social History Narrative   Pt lives with wife    [employer]    Social Drivers of Health   Financial Resource Strain: Low Risk  (09/16/2023)   Overall Financial Resource Strain (CARDIA)    Difficulty of Paying Living Expenses: Not hard at all  Food Insecurity: No Food Insecurity (09/16/2023)   Hunger Vital Sign    Worried About Running Out of Food in the Last Year: Never true    Ran Out of Food in the Last Year: Never true  Transportation Needs: No Transportation Needs (09/16/2023)   PRAPARE - Administrator, Civil Service (Medical): No    Lack of Transportation (Non-Medical): No  Physical Activity: Insufficiently Active (09/16/2023)    Exercise Vital Sign    Days of Exercise per Week: 4 days    Minutes of Exercise per Session: 30 min  Stress: No Stress Concern Present (09/16/2023)   [employer] of Occupational Health - Occupational Stress Questionnaire    Feeling of Stress : Only a little  Recent Concern: Stress - Stress Concern Present (08/05/2023)   [employer] of Occupational Health - Occupational Stress Questionnaire    Feeling of Stress : To some extent  Social Connections: Moderately Integrated (09/16/2023)   Social Connection and Isolation Panel [NHANES]    Frequency of Communication with Friends and Family: More than three times a week    Frequency of Social Gatherings with Friends and Family: Three times a week    Attends Religious Services: More than 4 times per year    Active Member of Clubs or Organizations: No    Attends Banker Meetings: Not on file    Marital Status: Married  Catering manager Violence: Not on file      Review of Systems  Cardiovascular:  Positive for palpitations.  All other systems reviewed and are negative.      Objective:   Physical Exam Vitals reviewed.  Constitutional:      Appearance: Normal appearance. He is normal weight.  Eyes:     Conjunctiva/sclera: Conjunctivae normal.  Neck:     Thyroid: No thyroid mass, thyromegaly or thyroid tenderness.  Cardiovascular:     Rate and Rhythm: Normal rate and regular rhythm.     Pulses: Normal pulses.     Heart sounds: Normal heart sounds. No murmur heard.    No friction rub. No gallop.  Pulmonary:     Effort: Pulmonary effort is normal. No respiratory distress.     Breath sounds: Normal breath sounds. No stridor. No wheezing, rhonchi or rales.  Abdominal:     General: Abdomen is flat. Bowel sounds are normal. There is no distension.     Palpations: Abdomen is soft. There is no mass.     Tenderness: There is no abdominal tenderness. There is no guarding or rebound.     Hernia: No hernia is present.   Musculoskeletal:     Right lower leg: No edema.     Left lower leg: No edema.  Neurological:     General: No focal deficit present.     Mental Status: He is alert and oriented to person, place, and time. Mental status is at baseline.     Cranial Nerves: No cranial nerve deficit.     Sensory: No  sensory deficit.     Motor: No weakness.     Coordination: Coordination normal.     Gait: Gait normal.     Deep Tendon Reflexes: Reflexes normal.           Assessment & Plan:  Palpitations - Plan: LONG TERM MONITOR (3-14 DAYS), CBC with Differential/Platelet, COMPLETE METABOLIC PANEL WITH GFR, TSH  Hypogonadism in male - Plan: Testosterone Total,Free,Bio, Males I believe patient is needed on PVCs.  I believe anxiety is playing a role in triggering this.  I will check a CBC a CMP and a TSH.  Schedule the patient for a ZIO heart monitor.  If PVCs are confirmed, consider trying diltiazem as he was unable to tolerate metoprolol.  Also consider treating anxiety more aggressively as a potential trigger.

## 2023-10-29 NOTE — Progress Notes (Unsigned)
EP to read

## 2023-10-30 ENCOUNTER — Ambulatory Visit: Payer: No Typology Code available for payment source | Admitting: Neurology

## 2023-10-30 DIAGNOSIS — G4733 Obstructive sleep apnea (adult) (pediatric): Secondary | ICD-10-CM

## 2023-10-30 DIAGNOSIS — U099 Post covid-19 condition, unspecified: Secondary | ICD-10-CM

## 2023-10-30 DIAGNOSIS — G478 Other sleep disorders: Secondary | ICD-10-CM

## 2023-10-30 DIAGNOSIS — R0689 Other abnormalities of breathing: Secondary | ICD-10-CM

## 2023-10-30 DIAGNOSIS — R0683 Snoring: Secondary | ICD-10-CM

## 2023-10-30 LAB — CBC WITH DIFFERENTIAL/PLATELET
Absolute Lymphocytes: 1863 {cells}/uL (ref 850–3900)
Absolute Monocytes: 580 {cells}/uL (ref 200–950)
Basophils Absolute: 48 {cells}/uL (ref 0–200)
Basophils Relative: 0.7 %
Eosinophils Absolute: 276 {cells}/uL (ref 15–500)
Eosinophils Relative: 4 %
HCT: 48.7 % (ref 38.5–50.0)
Hemoglobin: 16.5 g/dL (ref 13.2–17.1)
MCH: 29.9 pg (ref 27.0–33.0)
MCHC: 33.9 g/dL (ref 32.0–36.0)
MCV: 88.2 fL (ref 80.0–100.0)
MPV: 10 fL (ref 7.5–12.5)
Monocytes Relative: 8.4 %
Neutro Abs: 4133 {cells}/uL (ref 1500–7800)
Neutrophils Relative %: 59.9 %
Platelets: 328 10*3/uL (ref 140–400)
RBC: 5.52 10*6/uL (ref 4.20–5.80)
RDW: 12.1 % (ref 11.0–15.0)
Total Lymphocyte: 27 %
WBC: 6.9 10*3/uL (ref 3.8–10.8)

## 2023-10-30 LAB — COMPLETE METABOLIC PANEL WITH GFR
AG Ratio: 1.9 (calc) (ref 1.0–2.5)
ALT: 41 U/L (ref 9–46)
AST: 26 U/L (ref 10–40)
Albumin: 4.9 g/dL (ref 3.6–5.1)
Alkaline phosphatase (APISO): 77 U/L (ref 36–130)
BUN: 15 mg/dL (ref 7–25)
CO2: 26 mmol/L (ref 20–32)
Calcium: 10 mg/dL (ref 8.6–10.3)
Chloride: 101 mmol/L (ref 98–110)
Creat: 1 mg/dL (ref 0.60–1.26)
Globulin: 2.6 g/dL (ref 1.9–3.7)
Glucose, Bld: 103 mg/dL — ABNORMAL HIGH (ref 65–99)
Potassium: 4.1 mmol/L (ref 3.5–5.3)
Sodium: 138 mmol/L (ref 135–146)
Total Bilirubin: 1.4 mg/dL — ABNORMAL HIGH (ref 0.2–1.2)
Total Protein: 7.5 g/dL (ref 6.1–8.1)
eGFR: 98 mL/min/{1.73_m2} (ref >=60)

## 2023-10-30 LAB — TESTOSTERONE TOTAL,FREE,BIO, MALES
Albumin: 4.9 g/dL (ref 3.6–5.1)
Sex Hormone Binding: 23 nmol/L (ref 10–50)
Testosterone, Bioavailable: 111.7 ng/dL (ref 110.0–575.0)
Testosterone, Free: 50.1 pg/mL (ref 46.0–224.0)
Testosterone: 308 ng/dL (ref 250–827)

## 2023-10-30 LAB — TSH: TSH: 2.67 m[IU]/L (ref 0.40–4.50)

## 2023-11-03 ENCOUNTER — Encounter: Payer: Self-pay | Admitting: Family Medicine

## 2023-11-05 ENCOUNTER — Telehealth: Payer: Self-pay | Admitting: Neurology

## 2023-11-05 NOTE — Telephone Encounter (Signed)
Sent mychart

## 2023-11-13 ENCOUNTER — Ambulatory Visit: Payer: No Typology Code available for payment source | Attending: Family Medicine

## 2023-11-13 ENCOUNTER — Other Ambulatory Visit: Payer: Self-pay | Admitting: Family Medicine

## 2023-11-13 DIAGNOSIS — R002 Palpitations: Secondary | ICD-10-CM

## 2023-11-13 NOTE — Progress Notes (Unsigned)
 EP to read.

## 2023-11-14 ENCOUNTER — Encounter: Payer: Self-pay | Admitting: Neurology

## 2023-11-14 ENCOUNTER — Encounter: Payer: Self-pay | Admitting: Family Medicine

## 2023-11-15 ENCOUNTER — Encounter: Payer: Self-pay | Admitting: Family Medicine

## 2023-11-15 DIAGNOSIS — G4733 Obstructive sleep apnea (adult) (pediatric): Secondary | ICD-10-CM | POA: Insufficient documentation

## 2023-11-15 NOTE — Telephone Encounter (Addendum)
 Jazlen Ogarro D, CMA  Joylene Bradley; Garcia, Patricia; Ziegler, Melissa; Tucker, Dolanda; Cain, [place] New orders have been placed for the above pt, DOB: 1986-10-15 Thanks  New, Adine Samuel, Stephania BIRCH, CMA; New, Bradley; Garcia, Patricia; Ziegler, Melissa; Tucker, Dolanda; 1 other Received, thank you!

## 2023-11-16 ENCOUNTER — Other Ambulatory Visit: Payer: Self-pay | Admitting: Family Medicine

## 2023-11-16 MED ORDER — SULFAMETHOXAZOLE-TRIMETHOPRIM 800-160 MG PO TABS: 1.0000 | ORAL_TABLET | Freq: Two times a day (BID) | ORAL | 0 refills | Status: DC

## 2023-11-23 DIAGNOSIS — R002 Palpitations: Secondary | ICD-10-CM | POA: Diagnosis not present

## 2023-11-29 ENCOUNTER — Encounter: Payer: Self-pay | Admitting: Family Medicine

## 2023-12-02 ENCOUNTER — Encounter: Payer: Self-pay | Admitting: Neurology

## 2023-12-18 ENCOUNTER — Encounter: Payer: Self-pay | Admitting: Family Medicine

## 2023-12-18 ENCOUNTER — Ambulatory Visit: Payer: No Typology Code available for payment source | Admitting: Family Medicine

## 2023-12-18 VITALS — BP 120/76 | HR 103 | Temp 98.3°F | Ht 69.0 in | Wt 179.0 lb

## 2023-12-18 DIAGNOSIS — R002 Palpitations: Secondary | ICD-10-CM | POA: Diagnosis not present

## 2023-12-18 NOTE — Progress Notes (Signed)
 Subjective:    Patient ID: Kent Hernandez, male    DOB: 08/04/1986, 38 y.o.   MRN: [id number] I have seen the patient recently for palpitations.  I ordered a 14-day monitor.  The patient is here today to discuss the monitor results.  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  The official report from the electrophysiologist however states that there are only PVCs and PACs and no significant cardiac arrhythmia.  I was able to review the EKGs that the patient triggered.  These all corresponded to sinus or occasional PACs. Past Medical History:  Diagnosis Date   Allergic rhinitis    DDD (degenerative disc disease), cervical 2015   Esophageal reflux    Fatty liver disease, nonalcoholic    GERD (gastroesophageal reflux disease)    Phreesia 04/12/2020   H. pylori infection    Hyperlipidemia    Phreesia 04/12/2020   Hypertension    Phreesia 04/12/2020   Liver hemangioma    Obstructive sleep apnea    AHI-26   No past surgical history on file. Current Outpatient Medications on File Prior to Visit  Medication Sig Dispense Refill   albuterol  (VENTOLIN  HFA) 108 (90 Base) MCG/ACT inhaler Inhale 2 puffs into the lungs every 6 (six) hours as needed for wheezing or shortness of breath. 1 each 3   ALPRAZolam  (XANAX ) 0.5 MG tablet Take 1 tablet (0.5 mg total) by mouth 3 (three) times daily as needed. 30 tablet 0   ascorbic acid  (VITAMIN C) 500 MG tablet Take 1 tablet (500 mg total) by mouth daily.     cetirizine (ZYRTEC) 10 MG tablet Take 10 mg by mouth daily.     cyclobenzaprine  (FLEXERIL ) 10 MG tablet Take 1 tablet (10 mg total) by mouth 3 (three) times daily as needed for muscle spasms. 30 tablet 0   esomeprazole  (NEXIUM ) 40 MG capsule Take 1 capsule (40 mg total) by mouth daily. 30 capsule 3   fluticasone  (FLONASE ) 50 MCG/ACT nasal spray Place 1 spray into both nostrils daily.     gabapentin  (NEURONTIN ) 300 MG capsule Take 1 capsule (300 mg total) by mouth 3 (three) times  daily as needed (nerve pain). 90 capsule 3   rosuvastatin  (CRESTOR ) 20 MG tablet TAKE 1 TABLET BY MOUTH EVERY DAY 90 tablet 1   sildenafil  (VIAGRA ) 100 MG tablet Take 0.5-1 tablets (50-100 mg total) by mouth daily as needed for erectile dysfunction. 5 tablet 11   sulfamethoxazole -trimethoprim  (BACTRIM  DS) 800-160 MG tablet Take 1 tablet by mouth 2 (two) times daily. 14 tablet 0   Testosterone  1.62 % GEL Place 2 Pump onto the skin daily. 75 g 0   No current facility-administered medications on file prior to visit.   No Known Allergies Social History   Socioeconomic History   Marital status: Married    Spouse name: Not on file   Number of children: Not on file   Years of education: Not on file   Highest education level: 12th grade  Occupational History   Occupation: Company Secretary: [employer]  Tobacco Use   Smoking status: Former    Types: Cigarettes   Smokeless tobacco: Former    Quit date: 02/12/2011   Tobacco comments:    Light use  Substance and Sexual Activity   Alcohol use: Yes    Alcohol/week: 3.0 standard drinks of alcohol    Types: 2 Cans of beer, 1 Shots of liquor per week    Comment: occ  Drug use: No   Sexual activity: Yes  Other Topics Concern   Not on file  Social History Narrative   Pt lives with wife    [employer]    Social Drivers of Health   Financial Resource Strain: Low Risk  (12/17/2023)   Overall Financial Resource Strain (CARDIA)    Difficulty of Paying Living Expenses: Not hard at all  Food Insecurity: No Food Insecurity (12/17/2023)   Hunger Vital Sign    Worried About Running Out of Food in the Last Year: Never true    Ran Out of Food in the Last Year: Never true  Transportation Needs: No Transportation Needs (12/17/2023)   PRAPARE - Administrator, Civil Service (Medical): No    Lack of Transportation (Non-Medical): No  Physical Activity: Insufficiently Active (12/17/2023)   Exercise Vital Sign    Days of Exercise per  Week: 3 days    Minutes of Exercise per Session: 30 min  Stress: Stress Concern Present (12/17/2023)   [employer] of Occupational Health - Occupational Stress Questionnaire    Feeling of Stress : To some extent  Social Connections: Moderately Integrated (12/17/2023)   Social Connection and Isolation Panel [NHANES]    Frequency of Communication with Friends and Family: More than three times a week    Frequency of Social Gatherings with Friends and Family: Twice a week    Attends Religious Services: More than 4 times per year    Active Member of [employer] or Organizations: No    Attends Engineer, Structural: Not on file    Marital Status: Married  Catering Manager Violence: Not on file      Review of Systems  Cardiovascular:  Positive for palpitations.  All other systems reviewed and are negative.      Objective:   Physical Exam Vitals reviewed.  Constitutional:      Appearance: Normal appearance. He is normal weight.  Neck:     Thyroid: No thyroid mass, thyromegaly or thyroid tenderness.  Cardiovascular:     Rate and Rhythm: Normal rate and regular rhythm.     Pulses: Normal pulses.     Heart sounds: Normal heart sounds. No murmur heard.    No friction rub. No gallop.  Pulmonary:     Effort: Pulmonary effort is normal. No respiratory distress.     Breath sounds: Normal breath sounds. No stridor. No wheezing, rhonchi or rales.  Neurological:     Mental Status: He is alert.           Assessment & Plan:  Palpitations Patient's palpitations correspond to PACs and PVCs.  I did not see any EKG to support a diagnosis of second-degree type I Wenckebach block.  The electrophysiologist made no mention of a type I Wenckebach block.  Therefore I believe that this telemetry report is incorrect.  At the present time no intervention is necessary.

## 2024-01-05 ENCOUNTER — Encounter: Payer: Self-pay | Admitting: Neurology

## 2024-01-07 NOTE — Telephone Encounter (Signed)
 Can someone please contact pt to schedule initial cpap FU? set up 11/26/23 which means the visit will have to take place 2/14-4/13/2025. Can be with any NP or Dr Vickey Huger.  Thank you

## 2024-01-22 ENCOUNTER — Other Ambulatory Visit: Payer: Self-pay | Admitting: Family Medicine

## 2024-01-22 DIAGNOSIS — E291 Testicular hypofunction: Secondary | ICD-10-CM

## 2024-01-22 MED ORDER — TESTOSTERONE 1.62 % TD GEL
2.0000 | Freq: Every day | TRANSDERMAL | 2 refills | Status: DC
Start: 2024-01-22 — End: 2024-05-13

## 2024-01-22 MED ORDER — TESTOSTERONE 1.62 % TD GEL
2.0000 | Freq: Every day | TRANSDERMAL | 0 refills | Status: DC
Start: 2024-01-22 — End: 2024-01-22

## 2024-01-26 ENCOUNTER — Other Ambulatory Visit: Payer: Self-pay | Admitting: Family Medicine

## 2024-01-28 MED ORDER — ALPRAZOLAM 0.5 MG PO TABS: 0.5000 mg | ORAL_TABLET | Freq: Three times a day (TID) | ORAL | 0 refills | Status: AC | PRN

## 2024-02-06 NOTE — Progress Notes (Unsigned)
 [facility] [address] [place]. [place] [address] [phone]       OFFICE FOLLOW UP NOTE  Kent Hernandez Date of Birth:  1986/04/11 Medical Record Number:  [id number]    Primary neurologist: Dr. Vickey Huger Reason for visit: Initial CPAP follow-up    SUBJECTIVE:   CHIEF COMPLAINT:  Chief Complaint  Patient presents with   Follow-up    Patient in room #3 and alone. Patient states he is well and stable with no new concerns. Patient states he has been sleeping well.    Follow-up visit:  Prior visit: 10/25/2023 Dr. Vickey Huger (initial visit)  Brief HPI:   Kent Hernandez is a 37 y.o. male who was evaluated by Dr. Vickey Huger on 10/25/2023 for concern of underlying sleep apnea with complaints of snoring, nonrestorative sleep and daytime fatigue.  ESS 13/24.  HST 10/2023 showed moderate obstructive sleep apnea with total AHI 26/h exacerbated during REM sleep with REM AHI 40/h and O2 nadir of 89%.  AutoPap initiated 11/2023.     Interval history:  Being seen today for initial CPAP compliance visit.  Compliance report as below showing excellent usage and optimal residual AHI.  Reports overall doing very well with CPAP.  Currently using hybrid type mask which she is doing well with.  Unable to tolerate fullface mask.  He has been using melatonin to help sleep.  Sometimes he can have issues with maintaining a seal likely due to facial hair but not overly bothersome.  He reports significant improvement of sleep quality and daytime energy levels.  ESS 4/24, down from 13/24 prior to therapy.         ROS:   14 system review of systems performed and negative with exception of those listed in HPI  PMH:  Past Medical History:  Diagnosis Date   Allergic rhinitis    DDD (degenerative disc disease), cervical 2015   Esophageal reflux    Fatty liver disease, nonalcoholic    GERD (gastroesophageal reflux disease)    Phreesia 04/12/2020   H. pylori infection     Hyperlipidemia    Phreesia 04/12/2020   Hypertension    Phreesia 04/12/2020   Liver hemangioma    Obstructive sleep apnea    AHI-26    PSH: History reviewed. No pertinent surgical history.  Social History:  Social History   Socioeconomic History   Marital status: Married    Spouse name: Not on file   Number of children: Not on file   Years of education: Not on file   Highest education level: 12th grade  Occupational History   Occupation: Company secretary: [employer]  Tobacco Use   Smoking status: Former    Types: Cigarettes   Smokeless tobacco: Former    Quit date: 02/12/2011   Tobacco comments:    Light use  Substance and Sexual Activity   Alcohol use: Yes    Alcohol/week: 3.0 standard drinks of alcohol    Types: 2 Cans of beer, 1 Shots of liquor per week    Comment: occ   Drug use: No   Sexual activity: Yes  Other Topics Concern   Not on file  Social History Narrative   Pt lives with wife    [employer]    Social Drivers of Health   Financial Resource Strain: Low Risk  (12/17/2023)   Overall Financial Resource Strain (CARDIA)    Difficulty of Paying Living Expenses: Not hard at all  Food Insecurity: No Food  Insecurity (12/17/2023)   Hunger Vital Sign    Worried About Running Out of Food in the Last Year: Never true    Ran Out of Food in the Last Year: Never true  Transportation Needs: No Transportation Needs (12/17/2023)   PRAPARE - Administrator, Civil Service (Medical): No    Lack of Transportation (Non-Medical): No  Physical Activity: Insufficiently Active (12/17/2023)   Exercise Vital Sign    Days of Exercise per Week: 3 days    Minutes of Exercise per Session: 30 min  Stress: Stress Concern Present (12/17/2023)   [place]ent Hernandez of Occupational Health - Occupational Stress Questionnaire    Feeling of Stress : To some extent  Social Connections: Moderately Integrated (12/17/2023)   Social Connection and Isolation Panel [NHANES]     Frequency of Communication with Friends and Family: More than three times a week    Frequency of Social Gatherings with Friends and Family: Twice a week    Attends Religious Services: More than 4 times per year    Active Member of [employer] or Organizations: No    Attends Engineer, structural: Not on file    Marital Status: Married  Catering manager Violence: Not on file    Family History:  Family History  Problem Relation Age of Onset   Sleep apnea Mother    Hypertension Father    Kidney disease Father    Diabetes Maternal Uncle    Cancer Maternal Grandmother    Heart disease Paternal Grandmother    Diabetes Paternal Grandfather    Heart disease Paternal Grandfather     Medications:   Current Outpatient Medications on File Prior to Visit  Medication Sig Dispense Refill   albuterol (VENTOLIN HFA) 108 (90 Base) MCG/ACT inhaler Inhale 2 puffs into the lungs every 6 (six) hours as needed for wheezing or shortness of breath. 1 each 3   ALPRAZolam (XANAX) 0.5 MG tablet Take 1 tablet (0.5 mg total) by mouth 3 (three) times daily as needed. 30 tablet 0   ascorbic acid (VITAMIN C) 500 MG tablet Take 1 tablet (500 mg total) by mouth daily.     cetirizine (ZYRTEC) 10 MG tablet Take 10 mg by mouth daily.     cyclobenzaprine (FLEXERIL) 10 MG tablet Take 1 tablet (10 mg total) by mouth 3 (three) times daily as needed for muscle spasms. 30 tablet 0   esomeprazole (NEXIUM) 40 MG capsule Take 1 capsule (40 mg total) by mouth daily. 30 capsule 3   fluticasone (FLONASE) 50 MCG/ACT nasal spray Place 1 spray into both nostrils daily.     gabapentin (NEURONTIN) 300 MG capsule Take 1 capsule (300 mg total) by mouth 3 (three) times daily as needed (nerve pain). 90 capsule 3   rosuvastatin (CRESTOR) 20 MG tablet TAKE 1 TABLET BY MOUTH EVERY DAY 90 tablet 1   sildenafil (VIAGRA) 100 MG tablet Take 0.5-1 tablets (50-100 mg total) by mouth daily as needed for erectile dysfunction. 5 tablet 11    Testosterone 1.62 % GEL Place 2 Pump onto the skin daily. 75 g 2   sulfamethoxazole-trimethoprim (BACTRIM DS) 800-160 MG tablet Take 1 tablet by mouth 2 (two) times daily. (Patient not taking: Reported on 02/07/2024) 14 tablet 0   No current facility-administered medications on file prior to visit.    Allergies:  No Known Allergies    OBJECTIVE:  Physical Exam  Vitals:   02/07/24 0747  BP: 120/76  Pulse: 85  Weight: 179 lb (81.2  kg)  Height: 5\' 9"  (1.753 m)   Body mass index is 26.43 kg/m. No results found.   General: well developed, well nourished, pleasant middle-age Caucasian male, seated, in no evident distress Head: head normocephalic and atraumatic.   Neck: supple with no carotid or supraclavicular bruits Cardiovascular: regular rate and rhythm, no murmurs Musculoskeletal: no deformity Skin:  no rash/petichiae Vascular:  Normal pulses all extremities   Neurologic Exam Mental Status: Awake and fully alert. Oriented to place and time. Recent and remote memory intact. Attention span, concentration and fund of knowledge appropriate. Mood and affect appropriate.  Cranial Nerves: Pupils equal, briskly reactive to light. Extraocular movements full without nystagmus. Visual fields full to confrontation. Hearing intact. Facial sensation intact. Face, tongue, palate moves normally and symmetrically.  Motor: Normal bulk and tone. Normal strength in all tested extremity muscles Gait and Station: Arises from chair without difficulty. Stance is normal. Gait demonstrates normal stride length and balance without use of AD.         ASSESSMENT/PLAN: DARVIN DIALS is a 38 y.o. year old male    OSA on CPAP : Compliance report shows satisfactory usage with optimal residual AHI.  Continue current pressure settings of 5-15 with EPR 2.  Discussed continued nightly usage with ensuring greater than 4 hours nightly for optimal benefit and per insurance purposes.  Continue to follow with  DME company for any needed supplies or CPAP related concerns     Follow up in 6 months via MyChart video visit or call earlier if needed   CC:  PCP: Donita Brooks, MD    I spent 25 minutes of face-to-face and non-face-to-face time with patient.  This included previsit chart review including review of recent sleep study and prior OV notes, lab review, study review, order entry, electronic health record documentation, patient education and discussion regarding above diagnoses and treatment plan and answered all other questions to patient's satisfaction  Ihor Austin, [facility]  [facility] [address] [place], [place] [address]  Phone [phone] Fax [phone] Note: This document was prepared with digital dictation and possible smart phrase technology. Any transcriptional errors that result from this process are unintentional.

## 2024-02-07 ENCOUNTER — Encounter: Payer: Self-pay | Admitting: Family Medicine

## 2024-02-07 ENCOUNTER — Ambulatory Visit: Payer: No Typology Code available for payment source | Admitting: Adult Health

## 2024-02-07 ENCOUNTER — Encounter: Payer: Self-pay | Admitting: Adult Health

## 2024-02-07 VITALS — BP 120/76 | HR 85 | Ht 69.0 in | Wt 179.0 lb

## 2024-02-07 DIAGNOSIS — G4733 Obstructive sleep apnea (adult) (pediatric): Secondary | ICD-10-CM | POA: Diagnosis not present

## 2024-02-07 NOTE — Patient Instructions (Addendum)
 Your Plan:  Continue nightly use of CPAP for adequate sleep apnea management  Continue to follow with your DME company for any needs supplies or CPAP related concerns     Follow-up in 6 months or call earlier if needed      Thank you for coming to see [place] at [facility]. I hope we have been able to provide you high quality care today.  You may receive a patient satisfaction survey over the next few weeks. We would appreciate your feedback and comments so that we may continue to improve ourselves and the health of our patients.

## 2024-02-07 NOTE — Progress Notes (Signed)
 Bobbye Morton, CMA  Ellis Parents Candie Chroman, Efraim Kaufmann; Angus Seller, [place] New orders have been placed for the above pt, DOB: July 10, 1986 Thanks

## 2024-03-10 ENCOUNTER — Telehealth: Admitting: Nurse Practitioner

## 2024-03-10 DIAGNOSIS — J40 Bronchitis, not specified as acute or chronic: Secondary | ICD-10-CM

## 2024-03-10 MED ORDER — BENZONATATE 100 MG PO CAPS
100.0000 mg | ORAL_CAPSULE | Freq: Three times a day (TID) | ORAL | 0 refills | Status: DC | PRN
Start: 2024-03-10 — End: 2024-05-08

## 2024-03-10 MED ORDER — AZITHROMYCIN 250 MG PO TABS
ORAL_TABLET | ORAL | 0 refills | Status: AC
Start: 2024-03-10 — End: 2024-03-15

## 2024-03-10 MED ORDER — PREDNISONE 20 MG PO TABS
20.0000 mg | ORAL_TABLET | Freq: Two times a day (BID) | ORAL | 0 refills | Status: AC
Start: 2024-03-10 — End: 2024-03-15

## 2024-03-10 NOTE — Progress Notes (Signed)
 Virtual Visit Consent   Kent Hernandez, you are scheduled for a virtual visit with a [facility] provider today. Just as with appointments in the office, your consent must be obtained to participate. Your consent will be active for this visit and any virtual visit you may have with one of our providers in the next 365 days. If you have a MyChart account, a copy of this consent can be sent to you electronically.  As this is a virtual visit, video technology does not allow for your provider to perform a traditional examination. This may limit your provider's ability to fully assess your condition. If your provider identifies any concerns that need to be evaluated in person or the need to arrange testing (such as labs, EKG, etc.), we will make arrangements to do so. Although advances in technology are sophisticated, we cannot ensure that it will always work on either your end or our end. If the connection with a video visit is poor, the visit may have to be switched to a telephone visit. With either a video or telephone visit, we are not always able to ensure that we have a secure connection.  By engaging in this virtual visit, you consent to the provision of healthcare and authorize for your insurance to be billed (if applicable) for the services provided during this visit. Depending on your insurance coverage, you may receive a charge related to this service.  I need to obtain your verbal consent now. Are you willing to proceed with your visit today? Kent Hernandez has provided verbal consent on 03/10/2024 for a virtual visit (video or telephone). Mardene Shake, FNP  Date: 03/10/2024 7:22 PM   Virtual Visit via Video Note   I, Mardene Shake, connected with  Kent Hernandez  ([id number], 1986/06/28) on 03/10/24 at  7:15 PM EDT by a video-enabled telemedicine application and verified that I am speaking with the correct person using two identifiers.  Location: Patient: Virtual Visit Location Patient:  Home Provider: Virtual Visit Location Provider: Home Office   I discussed the limitations of evaluation and management by telemedicine and the availability of in person appointments. The patient expressed understanding and agreed to proceed.    History of Present Illness: Kent Hernandez is a 38 y.o. who identifies as a male who was assigned male at birth, and is being seen today for wet cough.   Cough started three days ago/ cough is "wet" he does have associated scratchy throat  No fevers  Cough is productive at times   No recent travel   Has an Albuterol  inhaler that he was given when he had pneumonia from COVID (2021), he has been using that without much relief   Denies SOB  He wears a CPAP at night  Denies any known sick contacts   Cough started after cleaning out his attic denies seeing any animal feces in attic space  Problems:  Patient Active Problem List   Diagnosis Date Noted   Obstructive sleep apnea    Sleep related choking sensation 10/25/2023   Non-restorative sleep 10/25/2023   Snoring 10/25/2023   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   Tinnitus of left ear 08/04/2022   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 04/12/2021   Fatty liver disease, nonalcoholic    Transaminitis 07/11/2020   Hypercalcemia 07/11/2020   Pneumonia due to COVID-19 virus 07/10/2020   Hyperlipidemia    Hypertension    Bilateral temporomandibular joint pain 01/22/2019   Eustachian tube dysfunction 01/22/2019  Nasal septal deviation 01/22/2019   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   DDD (degenerative disc disease), cervical 08/09/2016   Skin lesion 08/04/2013   Allergic rhinitis    GERD (gastroesophageal reflux disease) 07/11/2011   Dysphagia 06/12/2011   Esophageal reflux 06/12/2011    Allergies: No Known Allergies Medications:  Current Outpatient Medications:    albuterol  (VENTOLIN  HFA) 108 (90 Base) MCG/ACT inhaler, Inhale 2 puffs into the  lungs every 6 (six) hours as needed for wheezing or shortness of breath., Disp: 1 each, Rfl: 3   ALPRAZolam  (XANAX ) 0.5 MG tablet, Take 1 tablet (0.5 mg total) by mouth 3 (three) times daily as needed., Disp: 30 tablet, Rfl: 0   ascorbic acid  (VITAMIN C) 500 MG tablet, Take 1 tablet (500 mg total) by mouth daily., Disp: , Rfl:    cetirizine (ZYRTEC) 10 MG tablet, Take 10 mg by mouth daily., Disp: , Rfl:    cyclobenzaprine  (FLEXERIL ) 10 MG tablet, Take 1 tablet (10 mg total) by mouth 3 (three) times daily as needed for muscle spasms., Disp: 30 tablet, Rfl: 0   esomeprazole  (NEXIUM ) 40 MG capsule, Take 1 capsule (40 mg total) by mouth daily., Disp: 30 capsule, Rfl: 3   fluticasone  (FLONASE ) 50 MCG/ACT nasal spray, Place 1 spray into both nostrils daily., Disp: , Rfl:    gabapentin  (NEURONTIN ) 300 MG capsule, Take 1 capsule (300 mg total) by mouth 3 (three) times daily as needed (nerve pain)., Disp: 90 capsule, Rfl: 3   rosuvastatin  (CRESTOR ) 20 MG tablet, TAKE 1 TABLET BY MOUTH EVERY DAY, Disp: 90 tablet, Rfl: 1   sildenafil  (VIAGRA ) 100 MG tablet, Take 0.5-1 tablets (50-100 mg total) by mouth daily as needed for erectile dysfunction., Disp: 5 tablet, Rfl: 11   sulfamethoxazole -trimethoprim  (BACTRIM  DS) 800-160 MG tablet, Take 1 tablet by mouth 2 (two) times daily. (Patient not taking: Reported on 02/07/2024), Disp: 14 tablet, Rfl: 0   Testosterone  1.62 % GEL, Place 2 Pump onto the skin daily., Disp: 75 g, Rfl: 2  Observations/Objective: Patient is well-developed, well-nourished in no acute distress.  Resting comfortably  at home.  Head is normocephalic, atraumatic.  No labored breathing.  Speech is clear and coherent with logical content.  Patient is alert and oriented at baseline.    Assessment and Plan:  1. Bronchitis  Continue your Albuterol  every 4-6 hours as needed  Continue allergy regimen    Meds ordered this encounter  Medications   predniSONE  (DELTASONE ) 20 MG tablet    Sig:  Take 1 tablet (20 mg total) by mouth 2 (two) times daily with a meal for 5 days.    Dispense:  10 tablet    Refill:  0   benzonatate  (TESSALON ) 100 MG capsule    Sig: Take 1 capsule (100 mg total) by mouth 3 (three) times daily as needed.    Dispense:  30 capsule    Refill:  0   azithromycin  (ZITHROMAX ) 250 MG tablet    Sig: Take 2 tablets on day 1, then 1 tablet daily on days 2 through 5    Dispense:  6 tablet    Refill:  0      Follow Up Instructions: I discussed the assessment and treatment plan with the patient. The patient was provided an opportunity to ask questions and all were answered. The patient agreed with the plan and demonstrated an understanding of the instructions.  A copy of instructions were sent to the patient via MyChart unless otherwise noted below.  The patient was advised to call back or seek an in-person evaluation if the symptoms worsen or if the condition fails to improve as anticipated.    Mardene Shake, FNP

## 2024-04-21 ENCOUNTER — Encounter: Admitting: Family Medicine

## 2024-05-08 ENCOUNTER — Encounter: Payer: Self-pay | Admitting: Family Medicine

## 2024-05-08 ENCOUNTER — Ambulatory Visit (INDEPENDENT_AMBULATORY_CARE_PROVIDER_SITE_OTHER): Admitting: Family Medicine

## 2024-05-08 VITALS — BP 132/82 | HR 86 | Temp 98.5°F | Ht 69.0 in | Wt 173.0 lb

## 2024-05-08 DIAGNOSIS — M5412 Radiculopathy, cervical region: Secondary | ICD-10-CM

## 2024-05-08 DIAGNOSIS — K760 Fatty (change of) liver, not elsewhere classified: Secondary | ICD-10-CM | POA: Diagnosis not present

## 2024-05-08 DIAGNOSIS — E291 Testicular hypofunction: Secondary | ICD-10-CM

## 2024-05-08 DIAGNOSIS — Z0001 Encounter for general adult medical examination with abnormal findings: Secondary | ICD-10-CM | POA: Diagnosis not present

## 2024-05-08 DIAGNOSIS — E782 Mixed hyperlipidemia: Secondary | ICD-10-CM | POA: Diagnosis not present

## 2024-05-08 DIAGNOSIS — Z0000 Encounter for general adult medical examination without abnormal findings: Secondary | ICD-10-CM

## 2024-05-08 NOTE — Progress Notes (Signed)
 Subjective:    Patient ID: Kent Hernandez, male    DOB: 02/21/1986, 38 y.o.   MRN: [id number] Patient is a very pleasant 38 year old Caucasian male here today for complete physical exam.  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  Patient recently injured his neck.  He states that he was just stretching in bed when he felt a pulling sensation in his neck.  For a while he was having some numbness and tingling and paresthesias radiate from his neck down his arms on either side.  An MRI obtained of his neck 10 years ago did show degenerative disc disease at C5-C6.  However the numbness and tingling improved after the ibuprofen.  He still has some muscle tightness around the level of C7 and T1 in his upper back lower neck but this is improving.  He denies any weakness in his arms.  Otherwise he is doing well.  He is due for a tetanus shot but he politely declines that today.  He is not yet due for any cancer screening.  Recently was dilatations however he has reduce his consumption of caffeine and this has improved his PVCs.  He is not having to take any medication for that.  It does seem to be stress related. Past Medical History:  Diagnosis Date   Allergic rhinitis    Allergy    Anxiety    DDD (degenerative disc disease), cervical 2015   Esophageal reflux    Fatty liver disease, nonalcoholic    GERD (gastroesophageal reflux disease)    Phreesia 04/12/2020   H. pylori infection    Hyperlipidemia    Phreesia 04/12/2020   Hypertension    Phreesia 04/12/2020   Liver hemangioma    Obstructive sleep apnea    AHI-26   Sleep apnea    History reviewed. No pertinent surgical history. Current Outpatient Medications on File Prior to Visit  Medication Sig Dispense Refill   albuterol  (VENTOLIN  HFA) 108 (90 Base) MCG/ACT inhaler Inhale 2 puffs into the lungs every 6 (six) hours as needed for wheezing or shortness of breath. 1 each 3   ALPRAZolam   (XANAX ) 0.5 MG tablet Take 1 tablet (0.5 mg total) by mouth 3 (three) times daily as needed. 30 tablet 0   ascorbic acid  (VITAMIN C) 500 MG tablet Take 1 tablet (500 mg total) by mouth daily.     cetirizine (ZYRTEC) 10 MG tablet Take 10 mg by mouth daily.     cyclobenzaprine  (FLEXERIL ) 10 MG tablet Take 1 tablet (10 mg total) by mouth 3 (three) times daily as needed for muscle spasms. 30 tablet 0   esomeprazole  (NEXIUM ) 40 MG capsule Take 1 capsule (40 mg total) by mouth daily. 30 capsule 3   fluticasone  (FLONASE ) 50 MCG/ACT nasal spray Place 1 spray into both nostrils daily.     gabapentin  (NEURONTIN ) 300 MG capsule Take 1 capsule (300 mg total) by mouth 3 (three) times daily as needed (nerve pain). 90 capsule 3   rosuvastatin  (CRESTOR ) 20 MG tablet TAKE 1 TABLET BY MOUTH EVERY DAY 90 tablet 1   Testosterone  1.62 % GEL Place 2 Pump onto the skin daily. 75 g 2   sildenafil  (VIAGRA ) 100 MG tablet Take 0.5-1 tablets (50-100 mg total) by mouth daily as needed for erectile dysfunction. (Patient not taking: Reported on 05/08/2024) 5 tablet 11   No current facility-administered medications on file prior to visit.   No Known Allergies Social History   Socioeconomic  History   Marital status: Married    Spouse name: Not on file   Number of children: Not on file   Years of education: Not on file   Highest education level: 12th grade  Occupational History   Occupation: Company secretary: [employer]  Tobacco Use   Smoking status: Former    Types: Cigarettes, Cigars   Smokeless tobacco: Former    Quit date: 02/12/2011   Tobacco comments:    Light use  Substance and Sexual Activity   Alcohol use: Yes    Alcohol/week: 3.0 standard drinks of alcohol    Comment: occ   Drug use: No   Sexual activity: Yes  Other Topics Concern   Not on file  Social History Narrative   Pt lives with wife    [employer]    Social Drivers of Health   Financial Resource Strain: Low Risk  (05/04/2024)    Overall Financial Resource Strain (CARDIA)    Difficulty of Paying Living Expenses: Not hard at all  Food Insecurity: No Food Insecurity (05/04/2024)   Hunger Vital Sign    Worried About Running Out of Food in the Last Year: Never true    Ran Out of Food in the Last Year: Never true  Transportation Needs: No Transportation Needs (05/04/2024)   PRAPARE - Administrator, Civil Service (Medical): No    Lack of Transportation (Non-Medical): No  Physical Activity: Insufficiently Active (05/04/2024)   Exercise Vital Sign    Days of Exercise per Week: 3 days    Minutes of Exercise per Session: 30 min  Stress: No Stress Concern Present (05/04/2024)   [employer] of Occupational Health - Occupational Stress Questionnaire    Feeling of Stress: Only a little  Social Connections: Moderately Integrated (05/04/2024)   Social Connection and Isolation Panel    Frequency of Communication with Friends and Family: More than three times a week    Frequency of Social Gatherings with Friends and Family: Once a week    Attends Religious Services: More than 4 times per year    Active Member of [employer] or Organizations: No    Attends Engineer, structural: Not on file    Marital Status: Married  Catering manager Violence: Not on file      Review of Systems  Cardiovascular:  Positive for palpitations.  All other systems reviewed and are negative.      Objective:   Physical Exam Vitals reviewed.  Constitutional:      Appearance: Normal appearance. He is normal weight.   Eyes:     Conjunctiva/sclera: Conjunctivae normal.   Neck:     Thyroid: No thyroid mass, thyromegaly or thyroid tenderness.   Cardiovascular:     Rate and Rhythm: Normal rate and regular rhythm.     Pulses: Normal pulses.     Heart sounds: Normal heart sounds. No murmur heard.    No friction rub. No gallop.  Pulmonary:     Effort: Pulmonary effort is normal. No respiratory distress.     Breath sounds:  Normal breath sounds. No stridor. No wheezing, rhonchi or rales.  Abdominal:     General: Abdomen is flat. Bowel sounds are normal. There is no distension.     Palpations: Abdomen is soft. There is no mass.     Tenderness: There is no abdominal tenderness. There is no guarding or rebound.     Hernia: No hernia is present.   Musculoskeletal:  Right lower leg: No edema.     Left lower leg: No edema.   Neurological:     General: No focal deficit present.     Mental Status: He is alert and oriented to person, place, and time. Mental status is at baseline.     Cranial Nerves: No cranial nerve deficit.     Sensory: No sensory deficit.     Motor: No weakness.     Coordination: Coordination normal.     Gait: Gait normal.     Deep Tendon Reflexes: Reflexes normal.           Assessment & Plan:  Hypogonadism in male - Plan: CBC with Differential/Platelet, Comprehensive metabolic panel with GFR, Lipid panel, PSA, Testosterone  Total,Free,Bio, Males  General medical exam  Fatty liver disease, nonalcoholic  Mixed hyperlipidemia  Cervical radiculopathy Patient is doing very well.  His blood pressure today is excellent.  The only immunization he could be due for the tetanus shot which he defers today.  I will check a CBC CMP lipid panel PSA testosterone  level.  He is currently on testosterone  gel daily for hypergonadism.  The dose seems to be working symptomatically.  Monitor a PSA as well as a hemoglobin for any side effects.  I would like to keep his LDL cholesterol less than 899.  Monitor his liver function test given his history of fatty liver disease.  Regular anticipatory guidance is provided.

## 2024-05-09 ENCOUNTER — Telehealth: Payer: Self-pay

## 2024-05-09 ENCOUNTER — Encounter: Payer: Self-pay | Admitting: Family Medicine

## 2024-05-09 ENCOUNTER — Ambulatory Visit: Payer: Self-pay | Admitting: Family Medicine

## 2024-05-09 LAB — TESTOSTERONE TOTAL,FREE,BIO, MALES
Albumin: 5.2 g/dL — ABNORMAL HIGH (ref 3.6–5.1)
Sex Hormone Binding: 22 nmol/L (ref 10–50)
Testosterone, Bioavailable: 101 ng/dL — ABNORMAL LOW (ref 110.0–575.0)
Testosterone, Free: 42.8 pg/mL — ABNORMAL LOW (ref 46.0–224.0)
Testosterone: 265 ng/dL (ref 250–827)

## 2024-05-09 LAB — CBC WITH DIFFERENTIAL/PLATELET
Absolute Lymphocytes: 1606 {cells}/uL (ref 850–3900)
Absolute Monocytes: 477 {cells}/uL (ref 200–950)
Basophils Absolute: 42 {cells}/uL (ref 0–200)
Basophils Relative: 0.8 %
Eosinophils Absolute: 191 {cells}/uL (ref 15–500)
Eosinophils Relative: 3.6 %
HCT: 50.2 % — ABNORMAL HIGH (ref 38.5–50.0)
Hemoglobin: 16.5 g/dL (ref 13.2–17.1)
MCH: 29.2 pg (ref 27.0–33.0)
MCHC: 32.9 g/dL (ref 32.0–36.0)
MCV: 88.7 fL (ref 80.0–100.0)
MPV: 9.9 fL (ref 7.5–12.5)
Monocytes Relative: 9 %
Neutro Abs: 2984 {cells}/uL (ref 1500–7800)
Neutrophils Relative %: 56.3 %
Platelets: 318 10*3/uL (ref 140–400)
RBC: 5.66 10*6/uL (ref 4.20–5.80)
RDW: 12.3 % (ref 11.0–15.0)
Total Lymphocyte: 30.3 %
WBC: 5.3 10*3/uL (ref 3.8–10.8)

## 2024-05-09 LAB — COMPREHENSIVE METABOLIC PANEL WITH GFR
AG Ratio: 2.3 (calc) (ref 1.0–2.5)
ALT: 24 U/L (ref 9–46)
AST: 21 U/L (ref 10–40)
Albumin: 5.2 g/dL — ABNORMAL HIGH (ref 3.6–5.1)
Alkaline phosphatase (APISO): 69 U/L (ref 36–130)
BUN/Creatinine Ratio: 28 (calc) — ABNORMAL HIGH (ref 6–22)
BUN: 28 mg/dL — ABNORMAL HIGH (ref 7–25)
CO2: 26 mmol/L (ref 20–32)
Calcium: 9.7 mg/dL (ref 8.6–10.3)
Chloride: 102 mmol/L (ref 98–110)
Creat: 0.99 mg/dL (ref 0.60–1.26)
Globulin: 2.3 g/dL (ref 1.9–3.7)
Glucose, Bld: 83 mg/dL (ref 65–99)
Potassium: 4.1 mmol/L (ref 3.5–5.3)
Sodium: 135 mmol/L (ref 135–146)
Total Bilirubin: 1.9 mg/dL — ABNORMAL HIGH (ref 0.2–1.2)
Total Protein: 7.5 g/dL (ref 6.1–8.1)
eGFR: 99 mL/min/{1.73_m2} (ref >=60)

## 2024-05-09 LAB — LIPID PANEL
Cholesterol: 276 mg/dL — ABNORMAL HIGH (ref <200)
HDL: 56 mg/dL (ref >=40)
LDL Cholesterol (Calc): 198 mg/dL — ABNORMAL HIGH
Non-HDL Cholesterol (Calc): 220 mg/dL — ABNORMAL HIGH (ref <130)
Total CHOL/HDL Ratio: 4.9 (calc) (ref <5.0)
Triglycerides: 101 mg/dL (ref <150)

## 2024-05-09 LAB — PSA: PSA: 0.47 ng/mL (ref <=4.00)

## 2024-05-12 ENCOUNTER — Other Ambulatory Visit: Payer: Self-pay | Admitting: Family Medicine

## 2024-05-12 DIAGNOSIS — E291 Testicular hypofunction: Secondary | ICD-10-CM

## 2024-05-12 NOTE — Telephone Encounter (Signed)
 No notes for this encounter. Made in error.

## 2024-05-15 ENCOUNTER — Encounter: Payer: Self-pay | Admitting: Family Medicine

## 2024-05-28 ENCOUNTER — Telehealth: Payer: Self-pay

## 2024-05-28 NOTE — Telephone Encounter (Signed)
 Copied from CRM [id number]. Topic: General - Other >> May 28, 2024  1:56 PM Kent Hernandez wrote: Reason for CRM: patient stated he dropped off a form when he has a physical done with his provider on 6/26. He need that form faxed over to his job with the code on it. He stated the fax number is on the paper.

## 2024-06-02 ENCOUNTER — Ambulatory Visit: Admitting: Family Medicine

## 2024-06-02 ENCOUNTER — Encounter: Payer: Self-pay | Admitting: Family Medicine

## 2024-06-02 VITALS — BP 136/86 | HR 92 | Temp 97.9°F | Ht 69.0 in | Wt 177.0 lb

## 2024-06-02 DIAGNOSIS — R1084 Generalized abdominal pain: Secondary | ICD-10-CM | POA: Diagnosis not present

## 2024-06-02 NOTE — Progress Notes (Signed)
 Subjective:    Patient ID: Kent Hernandez, male    DOB: 15-Oct-1986, 38 y.o.   MRN: [id number] Patient is a very pleasant 38 year old Caucasian male here today for abdominal pain.  Patient states that for the last week, he has had bloating.  He also reports intermittent generalized abdominal discomfort.  He states that he had slight constipation prior to this.  He is going to the bathroom on a daily basis however he isn't completely evacuating his stool.  Patient states he also occasionally gets pain deep inside his rectal area.  The pain is sharp and for no reason.  His friend recently was diagnosed with colon cancer at 24.  He is now concerned that he may have colon cancer.  He states that he has been having the rectal pain intermittently for quite some time.  This did not begin 2 weeks ago Past Medical History:  Diagnosis Date   Allergic rhinitis    Allergy    Anxiety    DDD (degenerative disc disease), cervical 2015   Esophageal reflux    Fatty liver disease, nonalcoholic    GERD (gastroesophageal reflux disease)    Phreesia 04/12/2020   H. pylori infection    Hyperlipidemia    Phreesia 04/12/2020   Hypertension    Phreesia 04/12/2020   Liver hemangioma    Obstructive sleep apnea    AHI-26   Sleep apnea    No past surgical history on file. Current Outpatient Medications on File Prior to Visit  Medication Sig Dispense Refill   albuterol  (VENTOLIN  HFA) 108 (90 Base) MCG/ACT inhaler Inhale 2 puffs into the lungs every 6 (six) hours as needed for wheezing or shortness of breath. 1 each 3   ALPRAZolam  (XANAX ) 0.5 MG tablet Take 1 tablet (0.5 mg total) by mouth 3 (three) times daily as needed. 30 tablet 0   ascorbic acid  (VITAMIN C) 500 MG tablet Take 1 tablet (500 mg total) by mouth daily.     cetirizine (ZYRTEC) 10 MG tablet Take 10 mg by mouth daily.     cyclobenzaprine  (FLEXERIL ) 10 MG tablet Take 1 tablet (10 mg total) by mouth 3 (three) times daily as needed for muscle spasms. 30  tablet 0   esomeprazole  (NEXIUM ) 40 MG capsule Take 1 capsule (40 mg total) by mouth daily. 30 capsule 3   fluticasone  (FLONASE ) 50 MCG/ACT nasal spray Place 1 spray into both nostrils daily.     gabapentin  (NEURONTIN ) 300 MG capsule Take 1 capsule (300 mg total) by mouth 3 (three) times daily as needed (nerve pain). 90 capsule 3   rosuvastatin  (CRESTOR ) 20 MG tablet TAKE 1 TABLET BY MOUTH EVERY DAY 90 tablet 1   Testosterone  20.25 MG/ACT (1.62%) GEL PLACE 2 PUMP ONTO THE SKIN DAILY. 75 g 2   sildenafil  (VIAGRA ) 100 MG tablet Take 0.5-1 tablets (50-100 mg total) by mouth daily as needed for erectile dysfunction. (Patient not taking: Reported on 06/02/2024) 5 tablet 11   No current facility-administered medications on file prior to visit.   No Known Allergies Social History   Socioeconomic History   Marital status: Married    Spouse name: Not on file   Number of children: Not on file   Years of education: Not on file   Highest education level: 12th grade  Occupational History   Occupation: Company secretary: [employer]  Tobacco Use   Smoking status: Former    Types: Cigarettes, Cigars   Smokeless tobacco: Former  Quit date: 02/12/2011   Tobacco comments:    Light use  Substance and Sexual Activity   Alcohol use: Yes    Alcohol/week: 3.0 standard drinks of alcohol    Comment: occ   Drug use: No   Sexual activity: Yes  Other Topics Concern   Not on file  Social History Narrative   Pt lives with wife    [employer]    Social Drivers of Health   Financial Resource Strain: Low Risk  (05/04/2024)   Overall Financial Resource Strain (CARDIA)    Difficulty of Paying Living Expenses: Not hard at all  Food Insecurity: No Food Insecurity (05/04/2024)   Hunger Vital Sign    Worried About Running Out of Food in the Last Year: Never true    Ran Out of Food in the Last Year: Never true  Transportation Needs: No Transportation Needs (05/04/2024)   PRAPARE - Therapist, art (Medical): No    Lack of Transportation (Non-Medical): No  Physical Activity: Insufficiently Active (05/04/2024)   Exercise Vital Sign    Days of Exercise per Week: 3 days    Minutes of Exercise per Session: 30 min  Stress: No Stress Concern Present (05/04/2024)   [employer] of Occupational Health - Occupational Stress Questionnaire    Feeling of Stress: Only a little  Social Connections: Moderately Integrated (05/04/2024)   Social Connection and Isolation Panel    Frequency of Communication with Friends and Family: More than three times a week    Frequency of Social Gatherings with Friends and Family: Once a week    Attends Religious Services: More than 4 times per year    Active Member of [employer] or Organizations: No    Attends Engineer, structural: Not on file    Marital Status: Married  Catering manager Violence: Not on file      Review of Systems  Cardiovascular:  Positive for palpitations.  All other systems reviewed and are negative.      Objective:   Physical Exam Vitals reviewed.  Constitutional:      Appearance: Normal appearance. He is normal weight.  Eyes:     Conjunctiva/sclera: Conjunctivae normal.  Neck:     Thyroid: No thyroid mass, thyromegaly or thyroid tenderness.  Cardiovascular:     Rate and Rhythm: Normal rate and regular rhythm.     Pulses: Normal pulses.     Heart sounds: Normal heart sounds. No murmur heard.    No friction rub. No gallop.  Pulmonary:     Effort: Pulmonary effort is normal. No respiratory distress.     Breath sounds: Normal breath sounds. No stridor. No wheezing, rhonchi or rales.  Abdominal:     General: Abdomen is flat. Bowel sounds are normal. There is no distension.     Palpations: Abdomen is soft. There is no mass.     Tenderness: There is no abdominal tenderness. There is no guarding or rebound.     Hernia: No hernia is present.  Musculoskeletal:     Right lower leg: No edema.      Left lower leg: No edema.  Neurological:     General: No focal deficit present.     Mental Status: He is alert and oriented to person, place, and time. Mental status is at baseline.     Cranial Nerves: No cranial nerve deficit.     Sensory: No sensory deficit.     Motor: No weakness.  Coordination: Coordination normal.     Gait: Gait normal.     Deep Tendon Reflexes: Reflexes normal.     Patient declines rectal exam      Assessment & Plan:  Generalized abdominal pain Recommend rectal exam but the patient refuses.  I believe the patient's abdominal pain is most likely IBS-like abdominal spasms brought on by constipation.  Recommended trying MiraLAX daily for the next 4 to 5 days to see if this will help with the bloating and the constipation and the intermittent diffuse pain.  Patient wants to see GI.  He wants to rule out colon cancer.  He does not meet criteria for early screening for colon cancer however patient is very concerned about his rectal pain.  I recommended allowing me to do a rectal exam however the patient refuses.  To alleviate his fears, I will consult GI

## 2024-06-04 ENCOUNTER — Encounter: Payer: Self-pay | Admitting: Family Medicine

## 2024-06-24 ENCOUNTER — Encounter: Payer: Self-pay | Admitting: Internal Medicine

## 2024-08-11 NOTE — Progress Notes (Unsigned)
 [facility] [address] [place]. [place] [address] [phone]       OFFICE FOLLOW UP NOTE  [name] Date of Birth:  1986-01-15 Medical Record Number:  [id number]    Primary neurologist: Dr. Chalice Reason for visit: CPAP follow-up   Virtual Visit via Video Note  I connected with Kent Hernandez on 08/12/24 at  1:30 PM EDT by a video enabled telemedicine application and verified that I am speaking with the correct person using two identifiers.  Location: Patient: at work, in [employer] Provider: in office, [facility]   I discussed the limitations of evaluation and management by telemedicine and the availability of in person appointments. The patient expressed understanding and agreed to proceed.      SUBJECTIVE:  Follow-up visit:  Prior visit: 02/07/2024  Brief HPI:   Kent Hernandez is a 38 y.o. male who was evaluated by Dr. Chalice on 10/25/2023 for concern of underlying sleep apnea with complaints of snoring, nonrestorative sleep and daytime fatigue.  ESS 13/24.  HST 10/2023 showed moderate obstructive sleep apnea with total AHI 26/h exacerbated during REM sleep with REM AHI 40/h and O2 nadir of 89%.  AutoPap initiated 11/2023.     Interval history:  Patient returns for CPAP follow-up visit. Compliance report as below showing excellent usage and optimal residual AHI. Does report needing to lay on his back more due to shoulder issues, he also has chronic cervical issues which requires him to be flat (unable to sleep in a raised position). His wife has noticed increased snoring when on his back, she is a respiratory therapist and questioned need of changing pressure to a set pressure to help reduce this.  He continues to use hybrid type mask which he is tolerating.  He reports continued benefit in regards to daytime fatigue and sleep quality.  Continues to follow with DME [facility].  No further questions or concerns at this time.         ROS:   14  system review of systems performed and negative with exception of those listed in HPI  PMH:  Past Medical History:  Diagnosis Date   Allergic rhinitis    Allergy    Anxiety    DDD (degenerative disc disease), cervical 2015   Esophageal reflux    Fatty liver disease, nonalcoholic    GERD (gastroesophageal reflux disease)    Phreesia 04/12/2020   H. pylori infection    Hyperlipidemia    Phreesia 04/12/2020   Hypertension    Phreesia 04/12/2020   Liver hemangioma    Obstructive sleep apnea    AHI-26   Sleep apnea     PSH: No past surgical history on file.  Social History:  Social History   Socioeconomic History   Marital status: Married    Spouse name: Not on file   Number of children: Not on file   Years of education: Not on file   Highest education level: 12th grade  Occupational History   Occupation: Company secretary: [employer]  Tobacco Use   Smoking status: Former    Types: Cigarettes, Cigars   Smokeless tobacco: Former    Quit date: 02/12/2011   Tobacco comments:    Light use  Substance and Sexual Activity   Alcohol use: Yes    Alcohol/week: 3.0 standard drinks of alcohol    Comment: occ   Drug use: No   Sexual activity: Yes  Other Topics Concern   Not  on file  Social History Narrative   Pt lives with wife    [employer]    Social Drivers of Health   Financial Resource Strain: Low Risk  (05/04/2024)   Overall Financial Resource Strain (CARDIA)    Difficulty of Paying Living Expenses: Not hard at all  Food Insecurity: No Food Insecurity (05/04/2024)   Hunger Vital Sign    Worried About Running Out of Food in the Last Year: Never true    Ran Out of Food in the Last Year: Never true  Transportation Needs: No Transportation Needs (05/04/2024)   PRAPARE - Administrator, Civil Service (Medical): No    Lack of Transportation (Non-Medical): No  Physical Activity: Insufficiently Active (05/04/2024)   Exercise Vital Sign    Days of  Exercise per Week: 3 days    Minutes of Exercise per Session: 30 min  Stress: No Stress Concern Present (05/04/2024)   [employer] of Occupational Health - Occupational Stress Questionnaire    Feeling of Stress: Only a little  Social Connections: Moderately Integrated (05/04/2024)   Social Connection and Isolation Panel    Frequency of Communication with Friends and Family: More than three times a week    Frequency of Social Gatherings with Friends and Family: Once a week    Attends Religious Services: More than 4 times per year    Active Member of [employer] or Organizations: No    Attends Engineer, structural: Not on file    Marital Status: Married  Catering manager Violence: Not on file    Family History:  Family History  Problem Relation Age of Onset   Sleep apnea Mother    Hypertension Father    Kidney disease Father    Arthritis Father    Diabetes Maternal Uncle    Cancer Maternal Grandmother    Heart disease Paternal Grandmother    Diabetes Paternal Grandfather    Heart disease Paternal Grandfather     Medications:   Current Outpatient Medications on File Prior to Visit  Medication Sig Dispense Refill   albuterol  (VENTOLIN  HFA) 108 (90 Base) MCG/ACT inhaler Inhale 2 puffs into the lungs every 6 (six) hours as needed for wheezing or shortness of breath. 1 each 3   ALPRAZolam  (XANAX ) 0.5 MG tablet Take 1 tablet (0.5 mg total) by mouth 3 (three) times daily as needed. 30 tablet 0   ascorbic acid  (VITAMIN C) 500 MG tablet Take 1 tablet (500 mg total) by mouth daily.     cetirizine (ZYRTEC) 10 MG tablet Take 10 mg by mouth daily.     cyclobenzaprine  (FLEXERIL ) 10 MG tablet Take 1 tablet (10 mg total) by mouth 3 (three) times daily as needed for muscle spasms. 30 tablet 0   esomeprazole  (NEXIUM ) 40 MG capsule Take 1 capsule (40 mg total) by mouth daily. 30 capsule 3   fluticasone  (FLONASE ) 50 MCG/ACT nasal spray Place 1 spray into both nostrils daily.     gabapentin   (NEURONTIN ) 300 MG capsule Take 1 capsule (300 mg total) by mouth 3 (three) times daily as needed (nerve pain). 90 capsule 3   rosuvastatin  (CRESTOR ) 20 MG tablet TAKE 1 TABLET BY MOUTH EVERY DAY 90 tablet 1   sildenafil  (VIAGRA ) 100 MG tablet Take 0.5-1 tablets (50-100 mg total) by mouth daily as needed for erectile dysfunction. (Patient not taking: Reported on 06/02/2024) 5 tablet 11   Testosterone  20.25 MG/ACT (1.62%) GEL PLACE 2 PUMP ONTO THE SKIN DAILY. 75 g 2  No current facility-administered medications on file prior to visit.    Allergies:  No Known Allergies    OBJECTIVE:  Physical Exam  General: well developed, well nourished, pleasant middle-age Caucasian male, seated, in no evident distress  Neurologic Exam Mental Status: Awake and fully alert. Oriented to place and time. Recent and remote memory intact. Attention span, concentration and fund of knowledge appropriate. Mood and affect appropriate.        ASSESSMENT/PLAN: Kent Hernandez is a 38 y.o. year old male    OSA on CPAP :  Compliance report shows satisfactory usage with optimal residual AHI.   Per patient request, will change from autoPAP to CPAP with set pressure of 10 with EPR 2. Will recheck download in 4-6 weeks to ensure apnea still being well managed.  Discussed continued nightly usage with ensuring greater than 4 hours nightly for optimal benefit and per insurance purposes.   Continue to follow with DME company [facility] for any needed supplies or CPAP related concerns     Follow up in 1 year via MyChart video visit or call earlier if needed   CC:  PCP: Duanne Butler DASEN, MD    I personally spent a total of 20 minutes in the care of the patient today including preparing to see the patient, counseling and educating, placing orders, and documenting clinical information in the EHR.   Harlene Bogaert, AGNP-BC  [facility] [address] [place], [place]  [address]  Phone [phone] Fax [phone] Note: This document was prepared with digital dictation and possible smart phrase technology. Any transcriptional errors that result from this process are unintentional.

## 2024-08-12 ENCOUNTER — Encounter: Payer: Self-pay | Admitting: Adult Health

## 2024-08-12 ENCOUNTER — Telehealth: Admitting: Adult Health

## 2024-08-12 DIAGNOSIS — G4733 Obstructive sleep apnea (adult) (pediatric): Secondary | ICD-10-CM

## 2024-08-15 ENCOUNTER — Ambulatory Visit: Admitting: Internal Medicine

## 2024-08-15 ENCOUNTER — Encounter: Payer: Self-pay | Admitting: Internal Medicine

## 2024-08-15 VITALS — BP 130/80 | HR 70 | Ht 69.0 in | Wt 179.0 lb

## 2024-08-15 DIAGNOSIS — R1319 Other dysphagia: Secondary | ICD-10-CM

## 2024-08-15 DIAGNOSIS — K582 Mixed irritable bowel syndrome: Secondary | ICD-10-CM

## 2024-08-15 DIAGNOSIS — R1011 Right upper quadrant pain: Secondary | ICD-10-CM

## 2024-08-15 DIAGNOSIS — Z8371 Family history of adenomatous and serrated polyps: Secondary | ICD-10-CM

## 2024-08-15 DIAGNOSIS — K5900 Constipation, unspecified: Secondary | ICD-10-CM | POA: Diagnosis not present

## 2024-08-15 DIAGNOSIS — G8929 Other chronic pain: Secondary | ICD-10-CM

## 2024-08-15 DIAGNOSIS — K219 Gastro-esophageal reflux disease without esophagitis: Secondary | ICD-10-CM

## 2024-08-15 DIAGNOSIS — R194 Change in bowel habit: Secondary | ICD-10-CM

## 2024-08-15 DIAGNOSIS — R1310 Dysphagia, unspecified: Secondary | ICD-10-CM | POA: Diagnosis not present

## 2024-08-15 DIAGNOSIS — Z83719 Family history of colon polyps, unspecified: Secondary | ICD-10-CM

## 2024-08-15 NOTE — Progress Notes (Signed)
 HISTORY OF PRESENT ILLNESS:  Kent Hernandez is a 38 y.o. male, Social research officer, government at [employer] and son of Kent Hernandez, who presents today regarding GERD, abdominal pain, dysphagia, family history of adenomatous polyps prematurely, change in bowel habits, and rectal pain.  First, the patient reports longstanding problems with GERD.  Previous patient Dr. Alm Gander.  He underwent upper endoscopy July 2012.  The exam was normal.  No Barrett's.  He takes Nexium  for reflux symptoms.  Occasional mild solid food dysphagia.  Next, he reports chronic intermittent right upper quadrant pain.  This has bothered him over the past 4 to 5 years.  No obvious exacerbating or relieving factors.  This was evaluated with right upper quadrant abdominal ultrasound in September 2024.  The examination revealed no evidence for gallstones.  Incidental hepatic hemangioma.  The patient's lower GI complaints include a change in bowel habits as manifested by constipation.  He is concerned about colon cancer.  His father had advanced adenomatous polyps in his early 63s.  Patient also reports fleeting sharp rectal plain at times.  Laboratories from May 08, 2024 show unremarkable comprehensive metabolic panel.  Isolated elevated total bilirubin of 1.9.  Elevated cholesterol.  Unremarkable CBC with hemoglobin 16.5.  Normal TSH.  REVIEW OF SYSTEMS:  All non-GI ROS negative unless otherwise stated in the HPI except for anxiety, fatigue, itching, muscle cramps,  Past Medical History:  Diagnosis Date   Allergic rhinitis    Allergy    Anxiety    DDD (degenerative disc disease), cervical 2015   Esophageal reflux    Fatty liver disease, nonalcoholic    GERD (gastroesophageal reflux disease)    Phreesia 04/12/2020   H. pylori infection    Hyperlipidemia    Phreesia 04/12/2020   Hypertension    Phreesia 04/12/2020   Liver hemangioma    Obstructive sleep apnea    AHI-26   Sleep apnea     History reviewed. No pertinent surgical  history.  Social History MARQUES ERICSON  reports that he has quit smoking. His smoking use included cigarettes and cigars. He quit smokeless tobacco use about 13 years ago. He reports current alcohol use of about 3.0 standard drinks of alcohol per week. He reports that he does not use drugs.  family history includes Arthritis in his father; Cancer in his maternal grandmother; Diabetes in his maternal uncle and paternal grandfather; Heart disease in his paternal grandfather and paternal grandmother; Hypertension in his father; Kidney disease in his father; Sleep apnea in his mother.  No Known Allergies     PHYSICAL EXAMINATION: Vital signs: BP 130/80   Pulse 70   Ht 5' 9 (1.753 m)   Wt 179 lb (81.2 kg)   BMI 26.43 kg/m   Constitutional: generally well-appearing, no acute distress Psychiatric: alert and oriented x3, cooperative Eyes: extraocular movements intact, anicteric, conjunctiva pink Mouth: oral pharynx moist, no lesions Neck: supple no lymphadenopathy Cardiovascular: heart regular rate and rhythm, no murmur Lungs: clear to auscultation bilaterally Abdomen: soft, nontender, nondistended, no obvious ascites, no peritoneal signs, normal bowel sounds, no organomegaly Rectal: Deferred until colonoscopy Extremities: no clubbing, cyanosis, or lower extremity edema bilaterally Skin: no lesions on visible extremities Neuro: No focal deficits.  Cranial nerves intact  ASSESSMENT:  1.  Chronic GERD.  Requires PPI to control symptoms 2.  Mild intermittent solid food dysphagia.  Possible peptic stricture. 3.  Chronic right upper quadrant pain.  Negative ultrasound.  Etiology unclear. 4.  Change in bowel habits with  constipation 5.  Family history of advanced adenomatous polyps in early 51s (father). 6.  Proctalgia fugax   PLAN:  1.  Reflux precautions 2.  Continue Nexium .  Medication risk reviewed 3.  Schedule upper endoscopy with possible esophageal dilation to evaluate chronic  GERD, dysphagia, and upper abdominal pain despite PPI.The nature of the procedure, as well as the risks, benefits, and alternatives were carefully and thoroughly reviewed with the patient. Ample time for discussion and questions allowed. The patient understood, was satisfied, and agreed to proceed. 4.  Schedule colonoscopy.  This due to the family history of advanced adenomatous colon polyps in father age early 17s.  As well new onset constipation.The nature of the procedure, as well as the risks, benefits, and alternatives were carefully and thoroughly reviewed with the patient. Ample time for discussion and questions allowed. The patient understood, was satisfied, and agreed to proceed. 5.  Reassurance regarding proctalgia fugax 6.  Increase fiber and water A total time of 60 minutes was spent preparing to see the patient, reviewing multiple records and various studies, obtaining comprehensive history, performing medically appropriate physical exam, counseling and educating the patient regarding the above listed issues, ordering multiple endoscopic procedures, directing symptomatic therapies, and documenting clinical information in the health record

## 2024-08-15 NOTE — Patient Instructions (Signed)
 You have been scheduled for an endoscopy and colonoscopy. Please follow the written instructions given to you at your visit today.  If you use inhalers (even only as needed), please bring them with you on the day of your procedure.  DO NOT TAKE 7 DAYS PRIOR TO TEST- Trulicity (dulaglutide) Ozempic, Wegovy (semaglutide) Mounjaro (tirzepatide) Bydureon Bcise (exanatide extended release)  DO NOT TAKE 1 DAY PRIOR TO YOUR TEST Rybelsus (semaglutide) Adlyxin (lixisenatide) Victoza (liraglutide) Byetta (exanatide) ___________________________________________________________________________ _______________________________________________________  If your blood pressure at your visit was 140/90 or greater, please contact your primary care physician to follow up on this.  _______________________________________________________  If you are age 64 or older, your body mass index should be between 23-30. Your Body mass index is 26.43 kg/m. If this is out of the aforementioned range listed, please consider follow up with your Primary Care Provider.  If you are age 55 or younger, your body mass index should be between 19-25. Your Body mass index is 26.43 kg/m. If this is out of the aformentioned range listed, please consider follow up with your Primary Care Provider.   ________________________________________________________  The [facility] providers would like to encourage you to use MYCHART to communicate with providers for non-urgent requests or questions.  Due to long hold times on the telephone, sending your provider a message by [facility] may be a faster and more efficient way to get a response.  Please allow 48 business hours for a response.  Please remember that this is for non-urgent requests.  _______________________________________________________  [facility] is using a team-based approach to care.  Your team is made up of your doctor and two to three APPS. Our APPS (Nurse  Practitioners and Physician Assistants) work with your physician to ensure care continuity for you. They are fully qualified to address your health concerns and develop a treatment plan. They communicate directly with your gastroenterologist to care for you. Seeing the Advanced Practice Practitioners on your physician's team can help you by facilitating care more promptly, often allowing for earlier appointments, access to diagnostic testing, procedures, and other specialty referrals.

## 2024-08-28 ENCOUNTER — Other Ambulatory Visit: Payer: Self-pay | Admitting: Family Medicine

## 2024-08-28 DIAGNOSIS — E291 Testicular hypofunction: Secondary | ICD-10-CM

## 2024-09-12 ENCOUNTER — Telehealth: Payer: Self-pay | Admitting: Internal Medicine

## 2024-09-12 MED ORDER — NA SULFATE-K SULFATE-MG SULF 17.5-3.13-1.6 GM/177ML PO SOLN: 1.0000 | Freq: Once | ORAL | 0 refills | Status: AC

## 2024-09-12 NOTE — Telephone Encounter (Signed)
 Suprep sent

## 2024-09-12 NOTE — Telephone Encounter (Signed)
 Inbound call from patient stating that his pharmacy does not have his prescription for Suprep. Requesting it be sent to [facility] in [place]. Please advise.

## 2024-09-16 ENCOUNTER — Encounter: Payer: Self-pay | Admitting: Adult Health

## 2024-09-17 ENCOUNTER — Encounter: Payer: Self-pay | Admitting: Internal Medicine

## 2024-09-17 ENCOUNTER — Ambulatory Visit: Admitting: Internal Medicine

## 2024-09-17 VITALS — BP 120/80 | HR 82 | Temp 97.8°F | Resp 14 | Ht 69.0 in | Wt 179.0 lb

## 2024-09-17 DIAGNOSIS — R1319 Other dysphagia: Secondary | ICD-10-CM

## 2024-09-17 DIAGNOSIS — D125 Benign neoplasm of sigmoid colon: Secondary | ICD-10-CM

## 2024-09-17 DIAGNOSIS — D132 Benign neoplasm of duodenum: Secondary | ICD-10-CM | POA: Diagnosis not present

## 2024-09-17 DIAGNOSIS — Z1211 Encounter for screening for malignant neoplasm of colon: Secondary | ICD-10-CM

## 2024-09-17 DIAGNOSIS — D124 Benign neoplasm of descending colon: Secondary | ICD-10-CM

## 2024-09-17 DIAGNOSIS — K222 Esophageal obstruction: Secondary | ICD-10-CM

## 2024-09-17 DIAGNOSIS — K449 Diaphragmatic hernia without obstruction or gangrene: Secondary | ICD-10-CM

## 2024-09-17 DIAGNOSIS — Z83719 Family history of colon polyps, unspecified: Secondary | ICD-10-CM

## 2024-09-17 DIAGNOSIS — Z8371 Family history of adenomatous and serrated polyps: Secondary | ICD-10-CM | POA: Diagnosis not present

## 2024-09-17 DIAGNOSIS — K2100 Gastro-esophageal reflux disease with esophagitis, without bleeding: Secondary | ICD-10-CM | POA: Diagnosis not present

## 2024-09-17 DIAGNOSIS — K219 Gastro-esophageal reflux disease without esophagitis: Secondary | ICD-10-CM

## 2024-09-17 DIAGNOSIS — K5730 Diverticulosis of large intestine without perforation or abscess without bleeding: Secondary | ICD-10-CM

## 2024-09-17 MED ORDER — PANTOPRAZOLE SODIUM 40 MG PO TBEC: 40.0000 mg | DELAYED_RELEASE_TABLET | Freq: Every day | ORAL | 11 refills | Status: AC

## 2024-09-17 MED ORDER — SODIUM CHLORIDE 0.9 % IV SOLN: 500.0000 mL | Freq: Once | INTRAVENOUS | Status: DC

## 2024-09-17 NOTE — Op Note (Signed)
 [facility] Patient Name: Kent Hernandez Procedure Date: 09/17/2024 1:59 PM MRN: [id number] Endoscopist: Norleen SAILOR. Abran , MD, [id number] Age: 38 Referring MD:  Date of Birth: [birth year] Gender: Male Account #: [id number] Procedure:                Colonoscopy with cold snare polypectomy x 2 Indications:              Screening for colorectal malignant neoplasm. Father                            with advanced adenomatous polyps early 56s. Medicines:                Monitored Anesthesia Care Procedure:                Pre-Anesthesia Assessment:                           - Prior to the procedure, a History and Physical                            was performed, and patient medications and                            allergies were reviewed. The patient's tolerance of                            previous anesthesia was also reviewed. The risks                            and benefits of the procedure and the sedation                            options and risks were discussed with the patient.                            All questions were answered, and informed consent                            was obtained. Prior Anticoagulants: The patient has                            taken no anticoagulant or antiplatelet agents. ASA                            Grade Assessment: II - A patient with mild systemic                            disease. After reviewing the risks and benefits,                            the patient was deemed in satisfactory condition to                            undergo the procedure.  After obtaining informed consent, the colonoscope                            was passed under direct vision. Throughout the                            procedure, the patient's blood pressure, pulse, and                            oxygen saturations were monitored continuously. The                            CF [id number] #[id number] was introduced through the anus                             and advanced to the the cecum, identified by                            appendiceal orifice and ileocecal valve. The                            ileocecal valve, appendiceal orifice, and rectum                            were photographed. The quality of the bowel                            preparation was excellent. The colonoscopy was                            performed without difficulty. The patient tolerated                            the procedure well. The bowel preparation used was                            SUPREP via split dose instruction. Scope In: 2:07:31 PM Scope Out: 2:18:56 PM Scope Withdrawal Time: 0 hours 9 minutes 53 seconds  Total Procedure Duration: 0 hours 11 minutes 25 seconds  Findings:                 Two polyps were found in the sigmoid colon and                            descending colon. The polyps were 2 to 3 mm in                            size. These polyps were removed with a cold snare.                            Resection and retrieval were complete.                           Multiple small-mouthed diverticula were  found in                            the sigmoid colon.                           The exam was otherwise without abnormality on                            direct and retroflexion views. Complications:            No immediate complications. Estimated blood loss:                            None. Estimated Blood Loss:     Estimated blood loss: none. Impression:               - Two 2 to 3 mm polyps in the sigmoid colon and in                            the descending colon, removed with a cold snare.                            Resected and retrieved.                           - Diverticulosis in the sigmoid colon.                           - The examination was otherwise normal on direct                            and retroflexion views. Recommendation:           - Repeat colonoscopy in 7 years for surveillance.                            - Patient has a contact number available for                            emergencies. The signs and symptoms of potential                            delayed complications were discussed with the                            patient. Return to normal activities tomorrow.                            Written discharge instructions were provided to the                            patient.                           - Resume previous diet.                           -  Continue present medications.                           - Await pathology results. Norleen SAILOR. Abran, MD 09/17/2024 2:23:43 PM This report has been signed electronically.

## 2024-09-17 NOTE — Patient Instructions (Addendum)
 Resume previous diet.  Continue present medications.  Await pathology results.   Take Pantoprazole  40mg  daily, 30 minutes before food or water. Prescription sent to pharmacy. This will replace Nexium .  YOU HAD AN ENDOSCOPIC PROCEDURE TODAY AT THE [facility]:   Refer to the procedure report that was given to you for any specific questions about what was found during the examination.  If the procedure report does not answer your questions, please call your gastroenterologist to clarify.  If you requested that your care partner not be given the details of your procedure findings, then the procedure report has been included in a sealed envelope for you to review at your convenience later.  YOU SHOULD EXPECT: Some feelings of bloating in the abdomen. Passage of more gas than usual.  Walking can help get rid of the air that was put into your GI tract during the procedure and reduce the bloating. If you had a lower endoscopy (such as a colonoscopy or flexible sigmoidoscopy) you may notice spotting of blood in your stool or on the toilet paper. If you underwent a bowel prep for your procedure, you may not have a normal bowel movement for a few days.  Please Note:  You might notice some irritation and congestion in your nose or some drainage.  This is from the oxygen used during your procedure.  There is no need for concern and it should clear up in a day or so.  SYMPTOMS TO REPORT IMMEDIATELY:  Following lower endoscopy (colonoscopy or flexible sigmoidoscopy):  Excessive amounts of blood in the stool  Significant tenderness or worsening of abdominal pains  Swelling of the abdomen that is new, acute  Fever of 100F or higher  Following upper endoscopy (EGD)  Vomiting of blood or coffee ground material  New chest pain or pain under the shoulder blades  Painful or persistently difficult swallowing  New shortness of breath  Fever of 100F or higher  Black, tarry-looking stools  For  urgent or emergent issues, a gastroenterologist can be reached at any hour by calling [phone]. Do not use MyChart messaging for urgent concerns.    DIET:  We do recommend a small meal at first, but then you may proceed to your regular diet.  Drink plenty of fluids but you should avoid alcoholic beverages for 24 hours.  ACTIVITY:  You should plan to take it easy for the rest of today and you should NOT DRIVE or use heavy machinery until tomorrow (because of the sedation medicines used during the test).    FOLLOW UP: Our staff will call the number listed on your records the next business day following your procedure.  We will call around 7:15- 8:00 am to check on you and address any questions or concerns that you may have regarding the information given to you following your procedure. If we do not reach you, we will leave a message.     If any biopsies were taken you will be contacted by phone or by letter within the next 1-3 weeks.  Please call us  at [phone] if you have not heard about the biopsies in 3 weeks.    SIGNATURES/CONFIDENTIALITY: You and/or your care partner have signed paperwork which will be entered into your electronic medical record.  These signatures attest to the fact that that the information above on your After Visit Summary has been reviewed and is understood.  Full responsibility of the confidentiality of this discharge information lies with you and/or your  care-partner.

## 2024-09-17 NOTE — Op Note (Signed)
 [facility] Patient Name: Kent Hernandez Procedure Date: 09/17/2024 1:45 PM MRN: [id number] Endoscopist: Norleen SAILOR. Abran , MD, [id number] Age: 38 Referring MD:  Date of Birth: 04-Mar-1986 Gender: Male Account #: [id number] Procedure:                Upper GI endoscopy with balloon dilation (20 mm);                            biopsies Indications:              Dysphagia, , GERD, upper abdominal pain Medicines:                Monitored Anesthesia Care Procedure:                Pre-Anesthesia Assessment:                           - Prior to the procedure, a History and Physical                            was performed, and patient medications and                            allergies were reviewed. The patient's tolerance of                            previous anesthesia was also reviewed. The risks                            and benefits of the procedure and the sedation                            options and risks were discussed with the patient.                            All questions were answered, and informed consent                            was obtained. Prior Anticoagulants: The patient has                            taken no anticoagulant or antiplatelet agents.                            After reviewing the risks and benefits, the patient                            was deemed in satisfactory condition to undergo the                            procedure.                           After obtaining informed consent, the endoscope was  passed under direct vision. Throughout the                            procedure, the patient's blood pressure, pulse, and                            oxygen saturations were monitored continuously. The                            GIF [id number] #[id number] was introduced through the                            mouth, and advanced to the second part of duodenum.                            The upper GI endoscopy was accomplished  without                            difficulty. The patient tolerated the procedure                            well. Scope In: Scope Out: Findings:                 The exam of the esophagus revealed mild distal                            esophagitis with edema. No Barrett's.                           One benign-appearing, intrinsic moderate stenosis                            was found 40 cm from the incisors. This stenosis                            measured 1.5 cm (inner diameter). A TTS dilator was                            passed through the scope. Dilation with an 18-19-20                            mm balloon dilator was performed to 20 mm.                           The stomach was normal except for a small hiatal                            hernia.                           The examined duodenum revealed a 5 mm polyp.                            Multiple biopsies biopsies  were taken with a cold                            forceps for histology to rule out adenoma.                           The cardia and gastric fundus were normal on                            retroflexion. Complications:            No immediate complications. Estimated Blood Loss:     Estimated blood loss: none. Impression:               - Benign-appearing esophageal stenosis. Dilated.                            Reflux esophagitis                           - Normal stomach. Small hiatal hernia                           - Normal examined duodenum except for small polyp                            which was biopsied. . Recommendation:           - Patient has a contact number available for                            emergencies. The signs and symptoms of potential                            delayed complications were discussed with the                            patient. Return to normal activities tomorrow.                            Written discharge instructions were provided to the                             patient.                           - Resume previous diet.                           - Continue present medications.                           - Await pathology results.                           - Prescribed pantoprazole  40 mg daily; #30; 11  refills. Please take 1 each day for acid reflux.                            This will replace your Nexium                            - Office follow-up with Dr. Abran in 3 months Norleen SAILOR. Abran, MD 09/17/2024 2:50:04 PM This report has been signed electronically.

## 2024-09-17 NOTE — Progress Notes (Signed)
 Expand All Collapse All HISTORY OF PRESENT ILLNESS:   Kent Hernandez is a 38 y.o. male, social research officer, government at [employer] and son of Kent Hernandez, who presents today regarding GERD, abdominal pain, dysphagia, family history of adenomatous polyps prematurely, change in bowel habits, and rectal pain.   First, the patient reports longstanding problems with GERD.  Previous patient Dr. Alm Gander.  He underwent upper endoscopy July 2012.  The exam was normal.  No Barrett's.  He takes Nexium  for reflux symptoms.  Occasional mild solid food dysphagia.   Next, he reports chronic intermittent right upper quadrant pain.  This has bothered him over the past 4 to 5 years.  No obvious exacerbating or relieving factors.  This was evaluated with right upper quadrant abdominal ultrasound in September 2024.  The examination revealed no evidence for gallstones.  Incidental hepatic hemangioma.   The patient's lower GI complaints include a change in bowel habits as manifested by constipation.  He is concerned about colon cancer.  His father had advanced adenomatous polyps in his early 48s.  Patient also reports fleeting sharp rectal plain at times.   Laboratories from May 08, 2024 show unremarkable comprehensive metabolic panel.  Isolated elevated total bilirubin of 1.9.  Elevated cholesterol.  Unremarkable CBC with hemoglobin 16.5.  Normal TSH.   REVIEW OF SYSTEMS:   All non-GI ROS negative unless otherwise stated in the HPI except for anxiety, fatigue, itching, muscle cramps,       Past Medical History:  Diagnosis Date   Allergic rhinitis     Allergy     Anxiety     DDD (degenerative disc disease), cervical 2015   Esophageal reflux     Fatty liver disease, nonalcoholic     GERD (gastroesophageal reflux disease)      Phreesia 04/12/2020   H. pylori infection     Hyperlipidemia      Phreesia 04/12/2020   Hypertension      Phreesia 04/12/2020   Liver hemangioma     Obstructive sleep apnea      AHI-26   Sleep  apnea            History reviewed. No pertinent surgical history.       Social History Kent Hernandez  reports that he has quit smoking. His smoking use included cigarettes and cigars. He quit smokeless tobacco use about 13 years ago. He reports current alcohol use of about 3.0 standard drinks of alcohol per week. He reports that he does not use drugs.   family history includes Arthritis in his father; Cancer in his maternal grandmother; Diabetes in his maternal uncle and paternal grandfather; Heart disease in his paternal grandfather and paternal grandmother; Hypertension in his father; Kidney disease in his father; Sleep apnea in his mother.   Allergies  No Known Allergies         PHYSICAL EXAMINATION: Vital signs: BP 130/80   Pulse 70   Ht 5' 9 (1.753 m)   Wt 179 lb (81.2 kg)   BMI 26.43 kg/m   Constitutional: generally well-appearing, no acute distress Psychiatric: alert and oriented x3, cooperative Eyes: extraocular movements intact, anicteric, conjunctiva pink Mouth: oral pharynx moist, no lesions Neck: supple no lymphadenopathy Cardiovascular: heart regular rate and rhythm, no murmur Lungs: clear to auscultation bilaterally Abdomen: soft, nontender, nondistended, no obvious ascites, no peritoneal signs, normal bowel sounds, no organomegaly Rectal: Deferred until colonoscopy Extremities: no clubbing, cyanosis, or lower extremity edema bilaterally Skin: no lesions on visible extremities Neuro:  No focal deficits.  Cranial nerves intact   ASSESSMENT:   1.  Chronic GERD.  Requires PPI to control symptoms 2.  Mild intermittent solid food dysphagia.  Possible peptic stricture. 3.  Chronic right upper quadrant pain.  Negative ultrasound.  Etiology unclear. 4.  Change in bowel habits with constipation 5.  Family history of advanced adenomatous polyps in early 69s (father). 6.  Proctalgia fugax     PLAN:   1.  Reflux precautions 2.  Continue Nexium .  Medication risk  reviewed 3.  Schedule upper endoscopy with possible esophageal dilation to evaluate chronic GERD, dysphagia, and upper abdominal pain despite PPI.The nature of the procedure, as well as the risks, benefits, and alternatives were carefully and thoroughly reviewed with the patient. Ample time for discussion and questions allowed. The patient understood, was satisfied, and agreed to proceed. 4.  Schedule colonoscopy.  This due to the family history of advanced adenomatous colon polyps in father age early 106s.  As well new onset constipation.The nature of the procedure, as well as the risks, benefits, and alternatives were carefully and thoroughly reviewed with the patient. Ample time for discussion and questions allowed. The patient understood, was satisfied, and agreed to proceed. 5.  Reassurance regarding proctalgia fugax 6.  Increase fiber and water    Recent HPI as above.  No interval change.

## 2024-09-17 NOTE — Progress Notes (Signed)
 Sedate, gd SR, tolerated procedure well, VSS, report to RN

## 2024-09-17 NOTE — Progress Notes (Signed)
 Pt's states no medical or surgical changes since previsit or office visit.

## 2024-09-17 NOTE — Progress Notes (Signed)
 Called to room to assist during endoscopic procedure.  Patient ID and intended procedure confirmed with present staff. Received instructions for my participation in the procedure from the performing physician.

## 2024-09-18 ENCOUNTER — Telehealth: Payer: Self-pay | Admitting: Lactation Services

## 2024-09-18 NOTE — Telephone Encounter (Signed)
  Follow up Call-     09/17/2024    1:19 PM  Call back number  Post procedure Call Back phone  # [phone]  Permission to leave phone message Yes     Patient questions:  Do you have a fever, pain , or abdominal swelling? No. Pain Score  0 *  Have you tolerated food without any problems? Yes.    Have you been able to return to your normal activities? Yes.    Do you have any questions about your discharge instructions: Diet   No. Medications  No. Follow up visit  No.  Do you have questions or concerns about your Care? No.  Actions: * If pain score is 4 or above: No action needed, pain <4.

## 2024-09-22 LAB — SURGICAL PATHOLOGY

## 2024-09-23 ENCOUNTER — Ambulatory Visit: Payer: Self-pay | Admitting: Internal Medicine

## 2024-10-30 ENCOUNTER — Telehealth: Admitting: Family Medicine

## 2024-10-30 DIAGNOSIS — J329 Chronic sinusitis, unspecified: Secondary | ICD-10-CM

## 2024-10-30 DIAGNOSIS — H6991 Unspecified Eustachian tube disorder, right ear: Secondary | ICD-10-CM

## 2024-10-30 MED ORDER — PREDNISONE 20 MG PO TABS: ORAL_TABLET | ORAL | 0 refills | Status: AC

## 2024-10-30 NOTE — Progress Notes (Signed)
 Subjective:    Patient ID: Kent Hernandez, male    DOB: [birth year], 38 y.o.   MRN: [id number]  HPI Patient is a very pleasant 38 year old Caucasian male being seen today as a video visit.  Video call began at 958.  Video call concluded at 1010.  Patient is currently at work.  I am currently in my office.  He consents to be seen via video.  Patient has been sick for 3 weeks with a head cold.  He reports head congestion and rhinorrhea.  He reports that his right ear is constantly popping.  It feels stopped up.  It feels like it is full of fluid.  He denies any otalgia.  He does report reduced hearing in the right ear.  He is also having some dizziness and disequilibrium.  He denies any fevers or chills.  He denies any sinus pain.  He is already on Flonase  and Zyrtec for possible eustachian tube dysfunction.  He denies any shortness of breath but he does report pleurisy due to coughing.  He has had a nonproductive cough for 3 weeks. Past Medical History:  Diagnosis Date   Allergic rhinitis    Allergy    Anxiety    DDD (degenerative disc disease), cervical 2015   Esophageal reflux    Fatty liver disease, nonalcoholic    GERD (gastroesophageal reflux disease)    Phreesia 04/12/2020   H. pylori infection    Hyperlipidemia    Phreesia 04/12/2020   Hypertension    Phreesia 04/12/2020   Liver hemangioma    Obstructive sleep apnea    AHI-26   Sleep apnea    uses CPAP   Past Surgical History:  Procedure Laterality Date   WISDOM TOOTH EXTRACTION     Medications Ordered Prior to Encounter[1] Allergies[2] Social History   Socioeconomic History   Marital status: Married    Spouse name: Not on file   Number of children: Not on file   Years of education: Not on file   Highest education level: 12th grade  Occupational History   Occupation: Company Secretary: [employer]  Tobacco Use   Smoking status: Former    Types: Cigarettes, Cigars   Smokeless tobacco: Former    Types:  Chew    Quit date: 02/12/2011   Tobacco comments:    Light use  Vaping Use   Vaping status: Never Used  Substance and Sexual Activity   Alcohol use: Yes    Alcohol/week: 3.0 standard drinks of alcohol    Comment: occ   Drug use: No   Sexual activity: Yes  Other Topics Concern   Not on file  Social History Narrative   Pt lives with wife    [employer]    Social Drivers of Health   Tobacco Use: Medium Risk (09/17/2024)   Patient History    Smoking Tobacco Use: Former    Smokeless Tobacco Use: Former    Passive Exposure: Not on Actuary Strain: Low Risk (10/30/2024)   Overall Financial Resource Strain (CARDIA)    Difficulty of Paying Living Expenses: Not hard at all  Food Insecurity: No Food Insecurity (10/30/2024)   Epic    Worried About Radiation Protection Practitioner of Food in the Last Year: Never true    Ran Out of Food in the Last Year: Never true  Transportation Needs: No Transportation Needs (10/30/2024)   Epic    Lack of Transportation (Medical): No    Lack of Transportation (  Non-Medical): No  Physical Activity: Sufficiently Active (10/30/2024)   Exercise Vital Sign    Days of Exercise per Week: 5 days    Minutes of Exercise per Session: 150+ min  Stress: Stress Concern Present (10/30/2024)   [employer] of Occupational Health - Occupational Stress Questionnaire    Feeling of Stress: To some extent  Social Connections: Moderately Integrated (10/30/2024)   Social Connection and Isolation Panel    Frequency of Communication with Friends and Family: More than three times a week    Frequency of Social Gatherings with Friends and Family: Once a week    Attends Religious Services: More than 4 times per year    Active Member of [employer] or Organizations: No    Attends Engineer, Structural: Not on file    Marital Status: Married  Catering Manager Violence: Not on file  Depression (PHQ2-9): Low Risk (05/08/2024)   Depression (PHQ2-9)    PHQ-2 Score: 0  Alcohol  Screen: Low Risk (10/30/2024)   Alcohol Screen    Last Alcohol Screening Score (AUDIT): 4  Housing: Low Risk (10/30/2024)   Epic    Unable to Pay for Housing in the Last Year: No    Number of Times Moved in the Last Year: 0    Homeless in the Last Year: No  Utilities: Not At Risk (10/28/2023)   AHC Utilities    Threatened with loss of utilities: No  Health Literacy: Not on file      Review of Systems     Objective:   Physical Exam        Assessment & Plan:  Dysfunction of right eustachian tube  Rhinosinusitis Difficult to tell without being able to perform the physical therapy which the video visit does not allow but I suspect the patient has eustachian tube dysfunction either due to a sinus infection or due to an ear infection.  I will start the patient on a prednisone  taper pack since he is already been using Flonase  with no relief.  He can also use Augmentin 875 mg twice daily for 10 days.  If not improving he would need to be seen.     [1]  Current Outpatient Medications on File Prior to Visit  Medication Sig Dispense Refill   albuterol  (VENTOLIN  HFA) 108 (90 Base) MCG/ACT inhaler Inhale 2 puffs into the lungs every 6 (six) hours as needed for wheezing or shortness of breath. 1 each 3   ALPRAZolam  (XANAX ) 0.5 MG tablet Take 1 tablet (0.5 mg total) by mouth 3 (three) times daily as needed. 30 tablet 0   ascorbic acid  (VITAMIN C) 500 MG tablet Take 1 tablet (500 mg total) by mouth daily. (Patient not taking: Reported on 09/17/2024)     cetirizine (ZYRTEC) 10 MG tablet Take 10 mg by mouth daily.     cyclobenzaprine  (FLEXERIL ) 10 MG tablet Take 1 tablet (10 mg total) by mouth 3 (three) times daily as needed for muscle spasms. 30 tablet 0   esomeprazole  (NEXIUM ) 40 MG capsule Take 1 capsule (40 mg total) by mouth daily. 30 capsule 3   fluticasone  (FLONASE ) 50 MCG/ACT nasal spray Place 1 spray into both nostrils daily.     gabapentin  (NEURONTIN ) 300 MG capsule Take 1 capsule  (300 mg total) by mouth 3 (three) times daily as needed (nerve pain). (Patient not taking: No sig reported) 90 capsule 3   pantoprazole  (PROTONIX ) 40 MG tablet Take 1 tablet (40 mg total) by mouth daily. 30 tablet 11  rosuvastatin  (CRESTOR ) 20 MG tablet TAKE 1 TABLET BY MOUTH EVERY DAY (Patient not taking: Reported on 09/17/2024) 90 tablet 1   sildenafil  (VIAGRA ) 100 MG tablet Take 0.5-1 tablets (50-100 mg total) by mouth daily as needed for erectile dysfunction. (Patient not taking: No sig reported) 5 tablet 11   Testosterone  20.25 MG/ACT (1.62%) GEL PLACE 2 PUMP ONTO THE SKIN DAILY. 75 g 2   No current facility-administered medications on file prior to visit.  [2] No Known Allergies

## 2024-11-11 ENCOUNTER — Encounter: Payer: Self-pay | Admitting: Family Medicine

## 2024-11-13 ENCOUNTER — Telehealth
